# Patient Record
Sex: Male | Born: 1935
Health system: Southern US, Community
[De-identification: ages and names within clinical notes are randomized; demographics above are authoritative.]

## PROBLEM LIST (undated history)

## (undated) DIAGNOSIS — G4733 Obstructive sleep apnea (adult) (pediatric): Secondary | ICD-10-CM

## (undated) DIAGNOSIS — K8050 Calculus of bile duct without cholangitis or cholecystitis without obstruction: Secondary | ICD-10-CM

## (undated) DIAGNOSIS — F418 Other specified anxiety disorders: Secondary | ICD-10-CM

## (undated) DIAGNOSIS — F1011 Alcohol abuse, in remission: Secondary | ICD-10-CM

## (undated) DIAGNOSIS — M1990 Unspecified osteoarthritis, unspecified site: Secondary | ICD-10-CM

## (undated) DIAGNOSIS — IMO0002 Reserved for concepts with insufficient information to code with codable children: Secondary | ICD-10-CM

## (undated) DIAGNOSIS — B369 Superficial mycosis, unspecified: Secondary | ICD-10-CM

## (undated) DIAGNOSIS — R972 Elevated prostate specific antigen [PSA]: Secondary | ICD-10-CM

## (undated) DIAGNOSIS — Z974 Presence of external hearing-aid: Secondary | ICD-10-CM

## (undated) DIAGNOSIS — N529 Male erectile dysfunction, unspecified: Secondary | ICD-10-CM

## (undated) DIAGNOSIS — K635 Polyp of colon: Secondary | ICD-10-CM

## (undated) DIAGNOSIS — J449 Chronic obstructive pulmonary disease, unspecified: Secondary | ICD-10-CM

## (undated) DIAGNOSIS — Z87891 Personal history of nicotine dependence: Secondary | ICD-10-CM

## (undated) DIAGNOSIS — N486 Induration penis plastica: Secondary | ICD-10-CM

## (undated) DIAGNOSIS — H6240 Otitis externa in other diseases classified elsewhere, unspecified ear: Secondary | ICD-10-CM

## (undated) DIAGNOSIS — H9113 Presbycusis, bilateral: Secondary | ICD-10-CM

## (undated) DIAGNOSIS — I10 Essential (primary) hypertension: Secondary | ICD-10-CM

## (undated) HISTORY — DX: Other specified anxiety disorders: F41.8

## (undated) HISTORY — DX: Otitis externa in other diseases classified elsewhere, unspecified ear: H62.40

## (undated) HISTORY — DX: Alcohol abuse, in remission: F10.11

## (undated) HISTORY — DX: Unspecified osteoarthritis, unspecified site: M19.90

## (undated) HISTORY — DX: Essential (primary) hypertension: I10

## (undated) HISTORY — DX: Obstructive sleep apnea (adult) (pediatric): G47.33

## (undated) HISTORY — DX: Presbycusis, bilateral: H91.13

## (undated) HISTORY — DX: Personal history of nicotine dependence: Z87.891

## (undated) HISTORY — DX: Calculus of bile duct without cholangitis or cholecystitis without obstruction: K80.50

## (undated) HISTORY — DX: Induration penis plastica: N48.6

## (undated) HISTORY — DX: Male erectile dysfunction, unspecified: N52.9

## (undated) HISTORY — DX: Polyp of colon: K63.5

## (undated) HISTORY — DX: Superficial mycosis, unspecified: B36.9

## (undated) HISTORY — DX: Chronic obstructive pulmonary disease, unspecified: J44.9

## (undated) HISTORY — DX: Reserved for concepts with insufficient information to code with codable children: IMO0002

## (undated) HISTORY — DX: Elevated prostate specific antigen (PSA): R97.20

## (undated) HISTORY — DX: Presence of external hearing-aid: Z97.4

---
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
## 1967-08-01 HISTORY — PX: VASECTOMY: SHX75

## 1982-07-31 HISTORY — PX: OTHER SURGICAL HISTORY: SHX169

## 1987-08-01 DIAGNOSIS — F1011 Alcohol abuse, in remission: Secondary | ICD-10-CM

## 1987-08-01 HISTORY — DX: Alcohol abuse, in remission: F10.11

## 1998-07-31 DIAGNOSIS — Z87891 Personal history of nicotine dependence: Secondary | ICD-10-CM

## 1998-07-31 HISTORY — DX: Personal history of nicotine dependence: Z87.891

## 2002-12-15 ENCOUNTER — Ambulatory Visit (HOSPITAL_COMMUNITY): Admission: RE | Admit: 2002-12-15 | Discharge: 2002-12-15 | Payer: Self-pay | Admitting: Gastroenterology

## 2002-12-15 ENCOUNTER — Encounter (INDEPENDENT_AMBULATORY_CARE_PROVIDER_SITE_OTHER): Payer: Self-pay

## 2004-02-17 ENCOUNTER — Ambulatory Visit (HOSPITAL_COMMUNITY): Admission: RE | Admit: 2004-02-17 | Discharge: 2004-02-17 | Payer: Self-pay | Admitting: Orthopedic Surgery

## 2007-08-01 DIAGNOSIS — K8050 Calculus of bile duct without cholangitis or cholecystitis without obstruction: Secondary | ICD-10-CM

## 2007-08-01 HISTORY — DX: Calculus of bile duct without cholangitis or cholecystitis without obstruction: K80.50

## 2007-08-01 HISTORY — PX: CHOLECYSTECTOMY: SHX55

## 2007-08-01 HISTORY — PX: COLONOSCOPY: SHX174

## 2007-08-01 HISTORY — PX: CARDIAC CATHETERIZATION: SHX172

## 2007-09-18 ENCOUNTER — Ambulatory Visit (HOSPITAL_COMMUNITY): Admission: RE | Admit: 2007-09-18 | Discharge: 2007-09-18 | Payer: Self-pay | Admitting: Interventional Cardiology

## 2008-07-31 HISTORY — PX: OTHER SURGICAL HISTORY: SHX169

## 2008-12-22 ENCOUNTER — Ambulatory Visit (HOSPITAL_BASED_OUTPATIENT_CLINIC_OR_DEPARTMENT_OTHER): Admission: RE | Admit: 2008-12-22 | Discharge: 2008-12-22 | Payer: Self-pay | Admitting: Orthopedic Surgery

## 2009-01-17 ENCOUNTER — Emergency Department (HOSPITAL_COMMUNITY): Admission: EM | Admit: 2009-01-17 | Discharge: 2009-01-17 | Payer: Self-pay | Admitting: Emergency Medicine

## 2009-01-17 ENCOUNTER — Ambulatory Visit: Payer: Self-pay | Admitting: Surgery

## 2009-01-17 ENCOUNTER — Inpatient Hospital Stay (HOSPITAL_COMMUNITY): Admission: EM | Admit: 2009-01-17 | Discharge: 2009-02-02 | Payer: Self-pay | Admitting: Emergency Medicine

## 2009-01-18 ENCOUNTER — Ambulatory Visit: Payer: Self-pay | Admitting: Internal Medicine

## 2009-01-30 ENCOUNTER — Encounter (INDEPENDENT_AMBULATORY_CARE_PROVIDER_SITE_OTHER): Payer: Self-pay | Admitting: Internal Medicine

## 2009-03-23 ENCOUNTER — Encounter (INDEPENDENT_AMBULATORY_CARE_PROVIDER_SITE_OTHER): Payer: Self-pay | Admitting: General Surgery

## 2009-03-23 ENCOUNTER — Ambulatory Visit (HOSPITAL_COMMUNITY): Admission: RE | Admit: 2009-03-23 | Discharge: 2009-03-23 | Payer: Self-pay | Admitting: General Surgery

## 2010-08-21 ENCOUNTER — Encounter: Payer: Self-pay | Admitting: Orthopedic Surgery

## 2010-11-05 LAB — CBC
HCT: 36.6 % — ABNORMAL LOW (ref 39.0–52.0)
Hemoglobin: 12.4 g/dL — ABNORMAL LOW (ref 13.0–17.0)
RBC: 3.43 MIL/uL — ABNORMAL LOW (ref 4.22–5.81)
RDW: 13.5 % (ref 11.5–15.5)
WBC: 6.1 10*3/uL (ref 4.0–10.5)

## 2010-11-05 LAB — COMPREHENSIVE METABOLIC PANEL
ALT: 30 U/L (ref 0–53)
Alkaline Phosphatase: 47 U/L (ref 39–117)
BUN: 13 mg/dL (ref 6–23)
CO2: 28 mEq/L (ref 19–32)
Chloride: 100 mEq/L (ref 96–112)
GFR calc non Af Amer: >60 mL/min (ref >60)
Glucose, Bld: 109 mg/dL — ABNORMAL HIGH (ref 70–99)
Potassium: 4.5 mEq/L (ref 3.5–5.1)
Sodium: 138 mEq/L (ref 135–145)
Total Bilirubin: 0.7 mg/dL (ref 0.3–1.2)

## 2010-11-06 LAB — COMPREHENSIVE METABOLIC PANEL
ALT: 47 U/L (ref 0–53)
AST: 51 U/L — ABNORMAL HIGH (ref 0–37)
Albumin: 2.6 g/dL — ABNORMAL LOW (ref 3.5–5.2)
Alkaline Phosphatase: 111 U/L (ref 39–117)
BUN: 14 mg/dL (ref 6–23)
CO2: 25 mEq/L (ref 19–32)
Calcium: 8.8 mg/dL (ref 8.4–10.5)
Creatinine, Ser: 0.84 mg/dL (ref 0.4–1.5)
Glucose, Bld: 113 mg/dL — ABNORMAL HIGH (ref 70–99)
Total Bilirubin: 1.1 mg/dL (ref 0.3–1.2)

## 2010-11-06 LAB — CBC
HCT: 25.6 % — ABNORMAL LOW (ref 39.0–52.0)
HCT: 25.8 % — ABNORMAL LOW (ref 39.0–52.0)
Hemoglobin: 8.7 g/dL — ABNORMAL LOW (ref 13.0–17.0)
Hemoglobin: 8.8 g/dL — ABNORMAL LOW (ref 13.0–17.0)
Hemoglobin: 8.9 g/dL — ABNORMAL LOW (ref 13.0–17.0)
MCHC: 33.9 g/dL (ref 30.0–36.0)
MCHC: 34.6 g/dL (ref 30.0–36.0)
MCHC: 34.7 g/dL (ref 30.0–36.0)
MCV: 109.1 fL — ABNORMAL HIGH (ref 78.0–100.0)
MCV: 109.4 fL — ABNORMAL HIGH (ref 78.0–100.0)
MCV: 109.4 fL — ABNORMAL HIGH (ref 78.0–100.0)
MCV: 109.6 fL — ABNORMAL HIGH (ref 78.0–100.0)
Platelets: 198 10*3/uL (ref 150–400)
Platelets: 289 10*3/uL (ref 150–400)
Platelets: 376 10*3/uL (ref 150–400)
RBC: 2.33 MIL/uL — ABNORMAL LOW (ref 4.22–5.81)
RBC: 2.37 MIL/uL — ABNORMAL LOW (ref 4.22–5.81)
RDW: 15.2 % (ref 11.5–15.5)
RDW: 15.3 % (ref 11.5–15.5)
WBC: 5.1 10*3/uL (ref 4.0–10.5)
WBC: 5.8 10*3/uL (ref 4.0–10.5)

## 2010-11-06 LAB — BASIC METABOLIC PANEL
BUN: 10 mg/dL (ref 6–23)
BUN: 15 mg/dL (ref 6–23)
Calcium: 8.7 mg/dL (ref 8.4–10.5)
Calcium: 9.1 mg/dL (ref 8.4–10.5)
Creatinine, Ser: 0.67 mg/dL (ref 0.4–1.5)
Creatinine, Ser: 0.71 mg/dL (ref 0.4–1.5)
GFR calc Af Amer: >60 mL/min (ref >60)
GFR calc Af Amer: >60 mL/min (ref >60)

## 2010-11-06 LAB — HEMOGLOBIN AND HEMATOCRIT, BLOOD
HCT: 25.5 % — ABNORMAL LOW (ref 39.0–52.0)
Hemoglobin: 8.6 g/dL — ABNORMAL LOW (ref 13.0–17.0)

## 2010-11-06 LAB — GLUCOSE, CAPILLARY
Glucose-Capillary: 117 mg/dL — ABNORMAL HIGH (ref 70–99)
Glucose-Capillary: 119 mg/dL — ABNORMAL HIGH (ref 70–99)
Glucose-Capillary: 131 mg/dL — ABNORMAL HIGH (ref 70–99)

## 2010-11-06 LAB — IRON AND TIBC: Saturation Ratios: 12 % — ABNORMAL LOW (ref 20–55)

## 2010-11-06 LAB — FOLATE: Folate: >20 ng/mL

## 2010-11-06 LAB — FERRITIN: Ferritin: 1082 ng/mL — ABNORMAL HIGH (ref 22–322)

## 2010-11-07 LAB — BLOOD GAS, ARTERIAL
Acid-Base Excess: 10.4 mmol/L — ABNORMAL HIGH (ref 0.0–2.0)
Acid-Base Excess: 10.8 mmol/L — ABNORMAL HIGH (ref 0.0–2.0)
Acid-Base Excess: 6.7 mmol/L — ABNORMAL HIGH (ref 0.0–2.0)
Acid-Base Excess: 8 mmol/L — ABNORMAL HIGH (ref 0.0–2.0)
Acid-base deficit: 0.7 mmol/L (ref 0.0–2.0)
Acid-base deficit: 11.9 mmol/L — ABNORMAL HIGH (ref 0.0–2.0)
Acid-base deficit: 5.8 mmol/L — ABNORMAL HIGH (ref 0.0–2.0)
Acid-base deficit: 8.4 mmol/L — ABNORMAL HIGH (ref 0.0–2.0)
Bicarbonate: 16.4 mEq/L — ABNORMAL LOW (ref 20.0–24.0)
Bicarbonate: 16.4 mEq/L — ABNORMAL LOW (ref 20.0–24.0)
Bicarbonate: 20 mEq/L (ref 20.0–24.0)
Bicarbonate: 27.1 mEq/L — ABNORMAL HIGH (ref 20.0–24.0)
Bicarbonate: 31.7 mEq/L — ABNORMAL HIGH (ref 20.0–24.0)
Bicarbonate: 31.7 mEq/L — ABNORMAL HIGH (ref 20.0–24.0)
Bicarbonate: 33.3 mEq/L — ABNORMAL HIGH (ref 20.0–24.0)
Bicarbonate: 34.1 mEq/L — ABNORMAL HIGH (ref 20.0–24.0)
Delivery systems: POSITIVE
Drawn by: 103701
Drawn by: 129801
Drawn by: 145321
Drawn by: 229971
Drawn by: 229971
Drawn by: 232811
Drawn by: 270111
Expiratory PAP: 5
FIO2: 0.3 %
FIO2: 0.35 %
FIO2: 0.5 %
FIO2: 0.8 %
FIO2: 1 %
FIO2: 1 %
MECHVT: 400 mL
MECHVT: 400 mL
MECHVT: 450 mL
O2 Saturation: 82.4 %
O2 Saturation: 93.5 %
O2 Saturation: 94.5 %
O2 Saturation: 95.6 %
O2 Saturation: 97.5 %
O2 Saturation: 98.6 %
O2 Saturation: 98.9 %
PEEP: 5 cmH2O
PEEP: 5 cmH2O
PEEP: 5 cmH2O
PEEP: 5 cmH2O
PEEP: 5 cmH2O
Patient temperature: 98.4
Patient temperature: 98.6
Patient temperature: 98.6
Patient temperature: 98.6
Patient temperature: 99.1
Patient temperature: 99.2
RATE: 15 resp/min
RATE: 18 resp/min
RATE: 18 resp/min
RATE: 18 resp/min
TCO2: 14.5 mmol/L (ref 0–100)
TCO2: 15.3 mmol/L (ref 0–100)
TCO2: 15.7 mmol/L (ref 0–100)
TCO2: 18.9 mmol/L (ref 0–100)
TCO2: 25.7 mmol/L (ref 0–100)
TCO2: 29.4 mmol/L (ref 0–100)
TCO2: 31.6 mmol/L (ref 0–100)
pCO2 arterial: 32.8 mmHg — ABNORMAL LOW (ref 35.0–45.0)
pCO2 arterial: 38.6 mmHg (ref 35.0–45.0)
pCO2 arterial: 51.1 mmHg — ABNORMAL HIGH (ref 35.0–45.0)
pCO2 arterial: 52.7 mmHg — ABNORMAL HIGH (ref 35.0–45.0)
pCO2 arterial: 54.3 mmHg — ABNORMAL HIGH (ref 35.0–45.0)
pH, Arterial: 7.221 — ABNORMAL LOW (ref 7.350–7.450)
pH, Arterial: 7.328 — ABNORMAL LOW (ref 7.350–7.450)
pH, Arterial: 7.345 — ABNORMAL LOW (ref 7.350–7.450)
pH, Arterial: 7.365 (ref 7.350–7.450)
pH, Arterial: 7.396 (ref 7.350–7.450)
pH, Arterial: 7.409 (ref 7.350–7.450)
pH, Arterial: 7.412 (ref 7.350–7.450)
pO2, Arterial: 130 mmHg — ABNORMAL HIGH (ref 80.0–100.0)
pO2, Arterial: 154 mmHg — ABNORMAL HIGH (ref 80.0–100.0)
pO2, Arterial: 229 mmHg — ABNORMAL HIGH (ref 80.0–100.0)
pO2, Arterial: 59.2 mmHg — ABNORMAL LOW (ref 80.0–100.0)
pO2, Arterial: 66 mmHg — ABNORMAL LOW (ref 80.0–100.0)
pO2, Arterial: 83.8 mmHg (ref 80.0–100.0)
pO2, Arterial: 93.9 mmHg (ref 80.0–100.0)
pO2, Arterial: 99.7 mmHg (ref 80.0–100.0)

## 2010-11-07 LAB — COMPREHENSIVE METABOLIC PANEL
ALT: 110 U/L — ABNORMAL HIGH (ref 0–53)
ALT: 280 U/L — ABNORMAL HIGH (ref 0–53)
ALT: 74 U/L — ABNORMAL HIGH (ref 0–53)
AST: 1244 U/L — ABNORMAL HIGH (ref 0–37)
AST: 51 U/L — ABNORMAL HIGH (ref 0–37)
AST: 534 U/L — ABNORMAL HIGH (ref 0–37)
AST: 589 U/L — ABNORMAL HIGH (ref 0–37)
AST: 63 U/L — ABNORMAL HIGH (ref 0–37)
AST: 82 U/L — ABNORMAL HIGH (ref 0–37)
AST: 959 U/L — ABNORMAL HIGH (ref 0–37)
Albumin: 1.9 g/dL — ABNORMAL LOW (ref 3.5–5.2)
Albumin: 2.2 g/dL — ABNORMAL LOW (ref 3.5–5.2)
Albumin: 2.3 g/dL — ABNORMAL LOW (ref 3.5–5.2)
Albumin: 2.4 g/dL — ABNORMAL LOW (ref 3.5–5.2)
Albumin: 3.5 g/dL (ref 3.5–5.2)
Alkaline Phosphatase: 148 U/L — ABNORMAL HIGH (ref 39–117)
Alkaline Phosphatase: 200 U/L — ABNORMAL HIGH (ref 39–117)
Alkaline Phosphatase: 96 U/L (ref 39–117)
BUN: 15 mg/dL (ref 6–23)
BUN: 20 mg/dL (ref 6–23)
BUN: 24 mg/dL — ABNORMAL HIGH (ref 6–23)
BUN: 24 mg/dL — ABNORMAL HIGH (ref 6–23)
BUN: 28 mg/dL — ABNORMAL HIGH (ref 6–23)
BUN: 29 mg/dL — ABNORMAL HIGH (ref 6–23)
BUN: 30 mg/dL — ABNORMAL HIGH (ref 6–23)
CO2: 22 mEq/L (ref 19–32)
CO2: 27 mEq/L (ref 19–32)
CO2: 29 mEq/L (ref 19–32)
CO2: 29 mEq/L (ref 19–32)
CO2: 35 mEq/L — ABNORMAL HIGH (ref 19–32)
Calcium: 10.2 mg/dL (ref 8.4–10.5)
Calcium: 10.3 mg/dL (ref 8.4–10.5)
Calcium: 7.8 mg/dL — ABNORMAL LOW (ref 8.4–10.5)
Calcium: 8.3 mg/dL — ABNORMAL LOW (ref 8.4–10.5)
Calcium: 8.4 mg/dL (ref 8.4–10.5)
Chloride: 102 mEq/L (ref 96–112)
Chloride: 108 mEq/L (ref 96–112)
Chloride: 110 mEq/L (ref 96–112)
Chloride: 112 mEq/L (ref 96–112)
Chloride: 113 mEq/L — ABNORMAL HIGH (ref 96–112)
Creatinine, Ser: 0.73 mg/dL (ref 0.4–1.5)
Creatinine, Ser: 0.94 mg/dL (ref 0.4–1.5)
Creatinine, Ser: 1.18 mg/dL (ref 0.4–1.5)
Creatinine, Ser: 1.64 mg/dL — ABNORMAL HIGH (ref 0.4–1.5)
Creatinine, Ser: 1.9 mg/dL — ABNORMAL HIGH (ref 0.4–1.5)
GFR calc Af Amer: 42 mL/min — ABNORMAL LOW (ref >60)
GFR calc Af Amer: 48 mL/min — ABNORMAL LOW (ref >60)
GFR calc Af Amer: 50 mL/min — ABNORMAL LOW (ref >60)
GFR calc Af Amer: >60 mL/min (ref >60)
GFR calc Af Amer: >60 mL/min (ref >60)
GFR calc Af Amer: >60 mL/min (ref >60)
GFR calc Af Amer: >60 mL/min (ref >60)
GFR calc non Af Amer: 35 mL/min — ABNORMAL LOW (ref >60)
GFR calc non Af Amer: 41 mL/min — ABNORMAL LOW (ref >60)
GFR calc non Af Amer: 55 mL/min — ABNORMAL LOW (ref >60)
GFR calc non Af Amer: >60 mL/min (ref >60)
GFR calc non Af Amer: >60 mL/min (ref >60)
GFR calc non Af Amer: >60 mL/min (ref >60)
Glucose, Bld: 174 mg/dL — ABNORMAL HIGH (ref 70–99)
Glucose, Bld: 94 mg/dL (ref 70–99)
Potassium: 3.1 mEq/L — ABNORMAL LOW (ref 3.5–5.1)
Potassium: 3.4 mEq/L — ABNORMAL LOW (ref 3.5–5.1)
Potassium: 3.6 mEq/L (ref 3.5–5.1)
Potassium: 3.6 mEq/L (ref 3.5–5.1)
Potassium: 4.4 mEq/L (ref 3.5–5.1)
Sodium: 143 mEq/L (ref 135–145)
Sodium: 146 mEq/L — ABNORMAL HIGH (ref 135–145)
Sodium: 146 mEq/L — ABNORMAL HIGH (ref 135–145)
Sodium: 150 mEq/L — ABNORMAL HIGH (ref 135–145)
Total Bilirubin: 1.1 mg/dL (ref 0.3–1.2)
Total Bilirubin: 1.4 mg/dL — ABNORMAL HIGH (ref 0.3–1.2)
Total Bilirubin: 5.2 mg/dL — ABNORMAL HIGH (ref 0.3–1.2)
Total Bilirubin: 5.2 mg/dL — ABNORMAL HIGH (ref 0.3–1.2)
Total Bilirubin: 5.3 mg/dL — ABNORMAL HIGH (ref 0.3–1.2)
Total Protein: 4.9 g/dL — ABNORMAL LOW (ref 6.0–8.3)
Total Protein: 4.9 g/dL — ABNORMAL LOW (ref 6.0–8.3)
Total Protein: 5 g/dL — ABNORMAL LOW (ref 6.0–8.3)

## 2010-11-07 LAB — GLUCOSE, CAPILLARY
Glucose-Capillary: 102 mg/dL — ABNORMAL HIGH (ref 70–99)
Glucose-Capillary: 107 mg/dL — ABNORMAL HIGH (ref 70–99)
Glucose-Capillary: 110 mg/dL — ABNORMAL HIGH (ref 70–99)
Glucose-Capillary: 111 mg/dL — ABNORMAL HIGH (ref 70–99)
Glucose-Capillary: 112 mg/dL — ABNORMAL HIGH (ref 70–99)
Glucose-Capillary: 115 mg/dL — ABNORMAL HIGH (ref 70–99)
Glucose-Capillary: 117 mg/dL — ABNORMAL HIGH (ref 70–99)
Glucose-Capillary: 117 mg/dL — ABNORMAL HIGH (ref 70–99)
Glucose-Capillary: 118 mg/dL — ABNORMAL HIGH (ref 70–99)
Glucose-Capillary: 119 mg/dL — ABNORMAL HIGH (ref 70–99)
Glucose-Capillary: 127 mg/dL — ABNORMAL HIGH (ref 70–99)
Glucose-Capillary: 130 mg/dL — ABNORMAL HIGH (ref 70–99)
Glucose-Capillary: 131 mg/dL — ABNORMAL HIGH (ref 70–99)
Glucose-Capillary: 132 mg/dL — ABNORMAL HIGH (ref 70–99)
Glucose-Capillary: 133 mg/dL — ABNORMAL HIGH (ref 70–99)
Glucose-Capillary: 134 mg/dL — ABNORMAL HIGH (ref 70–99)
Glucose-Capillary: 135 mg/dL — ABNORMAL HIGH (ref 70–99)
Glucose-Capillary: 135 mg/dL — ABNORMAL HIGH (ref 70–99)
Glucose-Capillary: 136 mg/dL — ABNORMAL HIGH (ref 70–99)
Glucose-Capillary: 139 mg/dL — ABNORMAL HIGH (ref 70–99)
Glucose-Capillary: 140 mg/dL — ABNORMAL HIGH (ref 70–99)
Glucose-Capillary: 143 mg/dL — ABNORMAL HIGH (ref 70–99)
Glucose-Capillary: 144 mg/dL — ABNORMAL HIGH (ref 70–99)
Glucose-Capillary: 151 mg/dL — ABNORMAL HIGH (ref 70–99)
Glucose-Capillary: 151 mg/dL — ABNORMAL HIGH (ref 70–99)
Glucose-Capillary: 172 mg/dL — ABNORMAL HIGH (ref 70–99)
Glucose-Capillary: 91 mg/dL (ref 70–99)
Glucose-Capillary: 92 mg/dL (ref 70–99)
Glucose-Capillary: 92 mg/dL (ref 70–99)
Glucose-Capillary: 94 mg/dL (ref 70–99)

## 2010-11-07 LAB — CBC
HCT: 24.8 % — ABNORMAL LOW (ref 39.0–52.0)
HCT: 25.8 % — ABNORMAL LOW (ref 39.0–52.0)
HCT: 25.8 % — ABNORMAL LOW (ref 39.0–52.0)
HCT: 31.8 % — ABNORMAL LOW (ref 39.0–52.0)
HCT: 32.4 % — ABNORMAL LOW (ref 39.0–52.0)
HCT: 32.6 % — ABNORMAL LOW (ref 39.0–52.0)
HCT: 33.9 % — ABNORMAL LOW (ref 39.0–52.0)
HCT: 38.5 % — ABNORMAL LOW (ref 39.0–52.0)
HCT: 39.1 % (ref 39.0–52.0)
Hemoglobin: 10.9 g/dL — ABNORMAL LOW (ref 13.0–17.0)
Hemoglobin: 11.1 g/dL — ABNORMAL LOW (ref 13.0–17.0)
Hemoglobin: 11.3 g/dL — ABNORMAL LOW (ref 13.0–17.0)
Hemoglobin: 8.6 g/dL — ABNORMAL LOW (ref 13.0–17.0)
Hemoglobin: 9.1 g/dL — ABNORMAL LOW (ref 13.0–17.0)
MCHC: 33.9 g/dL (ref 30.0–36.0)
MCHC: 34 g/dL (ref 30.0–36.0)
MCHC: 34 g/dL (ref 30.0–36.0)
MCHC: 34.7 g/dL (ref 30.0–36.0)
MCHC: 34.8 g/dL (ref 30.0–36.0)
MCHC: 35 g/dL (ref 30.0–36.0)
MCV: 108.2 fL — ABNORMAL HIGH (ref 78.0–100.0)
MCV: 108.7 fL — ABNORMAL HIGH (ref 78.0–100.0)
MCV: 108.7 fL — ABNORMAL HIGH (ref 78.0–100.0)
MCV: 108.8 fL — ABNORMAL HIGH (ref 78.0–100.0)
MCV: 109 fL — ABNORMAL HIGH (ref 78.0–100.0)
MCV: 111.2 fL — ABNORMAL HIGH (ref 78.0–100.0)
Platelets: 115 10*3/uL — ABNORMAL LOW (ref 150–400)
Platelets: 146 10*3/uL — ABNORMAL LOW (ref 150–400)
Platelets: 22 10*3/uL — CL (ref 150–400)
Platelets: 34 10*3/uL — CL (ref 150–400)
Platelets: 36 10*3/uL — CL (ref 150–400)
Platelets: 54 10*3/uL — ABNORMAL LOW (ref 150–400)
Platelets: 60 10*3/uL — ABNORMAL LOW (ref 150–400)
Platelets: 62 10*3/uL — ABNORMAL LOW (ref 150–400)
Platelets: 62 10*3/uL — ABNORMAL LOW (ref 150–400)
Platelets: 81 10*3/uL — ABNORMAL LOW (ref 150–400)
RBC: 2.31 MIL/uL — ABNORMAL LOW (ref 4.22–5.81)
RBC: 2.79 MIL/uL — ABNORMAL LOW (ref 4.22–5.81)
RBC: 2.88 MIL/uL — ABNORMAL LOW (ref 4.22–5.81)
RBC: 2.91 MIL/uL — ABNORMAL LOW (ref 4.22–5.81)
RBC: 2.98 MIL/uL — ABNORMAL LOW (ref 4.22–5.81)
RBC: 3.06 MIL/uL — ABNORMAL LOW (ref 4.22–5.81)
RBC: 3.6 MIL/uL — ABNORMAL LOW (ref 4.22–5.81)
RDW: 14.9 % (ref 11.5–15.5)
RDW: 15.1 % (ref 11.5–15.5)
RDW: 15.1 % (ref 11.5–15.5)
RDW: 15.2 % (ref 11.5–15.5)
RDW: 15.2 % (ref 11.5–15.5)
RDW: 15.3 % (ref 11.5–15.5)
RDW: 15.6 % — ABNORMAL HIGH (ref 11.5–15.5)
WBC: 14.2 10*3/uL — ABNORMAL HIGH (ref 4.0–10.5)
WBC: 17.3 10*3/uL — ABNORMAL HIGH (ref 4.0–10.5)
WBC: 18.5 10*3/uL — ABNORMAL HIGH (ref 4.0–10.5)
WBC: 32 10*3/uL — ABNORMAL HIGH (ref 4.0–10.5)
WBC: 6.4 10*3/uL (ref 4.0–10.5)
WBC: 8 10*3/uL (ref 4.0–10.5)
WBC: 9.6 10*3/uL (ref 4.0–10.5)

## 2010-11-07 LAB — BASIC METABOLIC PANEL
BUN: 29 mg/dL — ABNORMAL HIGH (ref 6–23)
BUN: 29 mg/dL — ABNORMAL HIGH (ref 6–23)
BUN: 32 mg/dL — ABNORMAL HIGH (ref 6–23)
CO2: 21 mEq/L (ref 19–32)
CO2: 26 mEq/L (ref 19–32)
Calcium: 6.9 mg/dL — ABNORMAL LOW (ref 8.4–10.5)
Calcium: 7.6 mg/dL — ABNORMAL LOW (ref 8.4–10.5)
Calcium: 7.7 mg/dL — ABNORMAL LOW (ref 8.4–10.5)
Chloride: 108 mEq/L (ref 96–112)
Creatinine, Ser: 0.93 mg/dL (ref 0.4–1.5)
Creatinine, Ser: 1.05 mg/dL (ref 0.4–1.5)
Creatinine, Ser: 1.15 mg/dL (ref 0.4–1.5)
GFR calc Af Amer: >60 mL/min (ref >60)
GFR calc Af Amer: >60 mL/min (ref >60)
GFR calc non Af Amer: 51 mL/min — ABNORMAL LOW (ref >60)
GFR calc non Af Amer: >60 mL/min (ref >60)
GFR calc non Af Amer: >60 mL/min (ref >60)
GFR calc non Af Amer: >60 mL/min (ref >60)
Glucose, Bld: 112 mg/dL — ABNORMAL HIGH (ref 70–99)
Glucose, Bld: 158 mg/dL — ABNORMAL HIGH (ref 70–99)
Potassium: 3 mEq/L — ABNORMAL LOW (ref 3.5–5.1)
Potassium: 3.8 mEq/L (ref 3.5–5.1)
Sodium: 139 mEq/L (ref 135–145)
Sodium: 143 mEq/L (ref 135–145)

## 2010-11-07 LAB — DIFFERENTIAL
Band Neutrophils: 0 % (ref 0–10)
Basophils Absolute: 0 10*3/uL (ref 0.0–0.1)
Basophils Absolute: 0 10*3/uL (ref 0.0–0.1)
Basophils Relative: 0 % (ref 0–1)
Eosinophils Absolute: 0 10*3/uL (ref 0.0–0.7)
Eosinophils Absolute: 0 10*3/uL (ref 0.0–0.7)
Eosinophils Relative: 0 % (ref 0–5)
Eosinophils Relative: 0 % (ref 0–5)
Eosinophils Relative: 1 % (ref 0–5)
Lymphocytes Relative: 0 % — ABNORMAL LOW (ref 12–46)
Lymphocytes Relative: 1 % — ABNORMAL LOW (ref 12–46)
Lymphocytes Relative: 3 % — ABNORMAL LOW (ref 12–46)
Lymphs Abs: 0.1 10*3/uL — ABNORMAL LOW (ref 0.7–4.0)
Lymphs Abs: 0.8 10*3/uL (ref 0.7–4.0)
Monocytes Absolute: 0 10*3/uL — ABNORMAL LOW (ref 0.1–1.0)
Monocytes Absolute: 0.6 10*3/uL (ref 0.1–1.0)
Monocytes Relative: 0 % — ABNORMAL LOW (ref 3–12)
Monocytes Relative: 1 % — ABNORMAL LOW (ref 3–12)
Neutro Abs: 3.9 10*3/uL (ref 1.7–7.7)
Neutro Abs: 7.7 10*3/uL (ref 1.7–7.7)
Neutrophils Relative %: 0 % — ABNORMAL LOW (ref 43–77)
Neutrophils Relative %: 95 % — ABNORMAL HIGH (ref 43–77)
WBC Morphology: INCREASED

## 2010-11-07 LAB — DIC (DISSEMINATED INTRAVASCULAR COAGULATION)PANEL
D-Dimer, Quant: >20 ug/mL-FEU — ABNORMAL HIGH (ref 0.00–0.48)
Fibrinogen: 477 mg/dL — ABNORMAL HIGH (ref 204–475)
Fibrinogen: 651 mg/dL — ABNORMAL HIGH (ref 204–475)
INR: 1.2 (ref 0.00–1.49)
Platelets: 16 10*3/uL — CL (ref 150–400)
Platelets: 41 10*3/uL — CL (ref 150–400)
Prothrombin Time: 15.4 seconds — ABNORMAL HIGH (ref 11.6–15.2)
Smear Review: NONE SEEN

## 2010-11-07 LAB — CARBOXYHEMOGLOBIN
Carboxyhemoglobin: 0.4 % — ABNORMAL LOW (ref 0.5–1.5)
Carboxyhemoglobin: 0.6 % (ref 0.5–1.5)
Carboxyhemoglobin: 0.6 % (ref 0.5–1.5)
Carboxyhemoglobin: 0.7 % (ref 0.5–1.5)
Carboxyhemoglobin: 0.8 % (ref 0.5–1.5)
Carboxyhemoglobin: 0.9 % (ref 0.5–1.5)
Methemoglobin: 1.5 % (ref 0.0–1.5)
Methemoglobin: 1.8 % — ABNORMAL HIGH (ref 0.0–1.5)
O2 Saturation: 63.5 %
O2 Saturation: 68.2 %
O2 Saturation: 69 %
O2 Saturation: 69.8 %
O2 Saturation: 70.2 %
O2 Saturation: 71.1 %
O2 Saturation: 72.5 %
Total hemoglobin: 10.4 g/dL — ABNORMAL LOW (ref 13.5–18.0)
Total hemoglobin: 10.7 g/dL — ABNORMAL LOW (ref 13.5–18.0)
Total hemoglobin: 10.9 g/dL — ABNORMAL LOW (ref 13.5–18.0)

## 2010-11-07 LAB — URINALYSIS, ROUTINE W REFLEX MICROSCOPIC
Glucose, UA: NEGATIVE mg/dL
Ketones, ur: NEGATIVE mg/dL
Leukocytes, UA: NEGATIVE
Nitrite: NEGATIVE
Nitrite: NEGATIVE
Protein, ur: 100 mg/dL — AB
Specific Gravity, Urine: 1.035 — ABNORMAL HIGH (ref 1.005–1.030)
Urobilinogen, UA: 1 mg/dL (ref 0.0–1.0)
pH: 6.5 (ref 5.0–8.0)

## 2010-11-07 LAB — CULTURE, BLOOD (ROUTINE X 2)
Culture: NO GROWTH
Culture: NO GROWTH

## 2010-11-07 LAB — APTT: aPTT: 40 seconds — ABNORMAL HIGH (ref 24–37)

## 2010-11-07 LAB — HEPATIC FUNCTION PANEL
ALT: 340 U/L — ABNORMAL HIGH (ref 0–53)
AST: 351 U/L — ABNORMAL HIGH (ref 0–37)
Albumin: 2.2 g/dL — ABNORMAL LOW (ref 3.5–5.2)
Alkaline Phosphatase: 82 U/L (ref 39–117)
Total Bilirubin: 5.2 mg/dL — ABNORMAL HIGH (ref 0.3–1.2)

## 2010-11-07 LAB — CK TOTAL AND CKMB (NOT AT ARMC)
CK, MB: 2.8 ng/mL (ref 0.3–4.0)
Relative Index: INVALID (ref 0.0–2.5)
Total CK: 53 U/L (ref 7–232)

## 2010-11-07 LAB — POCT CARDIAC MARKERS
CKMB, poc: <1 ng/mL — ABNORMAL LOW (ref 1.0–8.0)
Myoglobin, poc: 101 ng/mL (ref 12–200)

## 2010-11-07 LAB — CULTURE, BAL-QUANTITATIVE W GRAM STAIN: Culture: NO GROWTH

## 2010-11-07 LAB — URINE CULTURE
Colony Count: NO GROWTH
Colony Count: NO GROWTH
Culture: NO GROWTH
Culture: NO GROWTH
Special Requests: NEGATIVE

## 2010-11-07 LAB — CORTISOL: Cortisol, Plasma: 32.7 ug/dL

## 2010-11-07 LAB — D-DIMER, QUANTITATIVE: D-Dimer, Quant: 12.63 ug/mL-FEU — ABNORMAL HIGH (ref 0.00–0.48)

## 2010-11-07 LAB — PREPARE PLATELETS

## 2010-11-07 LAB — BRAIN NATRIURETIC PEPTIDE
Pro B Natriuretic peptide (BNP): 746 pg/mL — ABNORMAL HIGH (ref 0.0–100.0)
Pro B Natriuretic peptide (BNP): 81.1 pg/mL (ref 0.0–100.0)
Pro B Natriuretic peptide (BNP): 814 pg/mL — ABNORMAL HIGH (ref 0.0–100.0)

## 2010-11-07 LAB — PROTIME-INR: INR: 1.7 — ABNORMAL HIGH (ref 0.00–1.49)

## 2010-11-07 LAB — LIPASE, BLOOD: Lipase: 32 U/L (ref 11–59)

## 2010-11-07 LAB — URINE MICROSCOPIC-ADD ON

## 2010-11-07 LAB — TROPONIN I: Troponin I: 0.27 ng/mL — ABNORMAL HIGH (ref 0.00–0.06)

## 2010-11-07 LAB — LACTIC ACID, PLASMA: Lactic Acid, Venous: 5.8 mmol/L — ABNORMAL HIGH (ref 0.5–2.2)

## 2010-12-13 NOTE — Consult Note (Signed)
[name], [name]               ACCOUNT NO.:  [id number]   MEDICAL RECORD NO.:  [id number]          PATIENT TYPE:  INP   LOCATION:  1227                         FACILITY:  [facility]   PHYSICIAN:  Bernette Redbird, M.D.   DATE OF BIRTH:  Jul 04, 1933   DATE OF CONSULTATION:  01/17/2009  DATE OF DISCHARGE:                                 CONSULTATION   GASTROENTEROLOGY CONSULTATION:  Dr. 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   [name] has enjoyed generally good health in the past, although he  does carry a diagnosis of COPD.  He has a 48-hour history of recurring  shaking chills and epigastric pain, in an undulating, on and off  fashion.  He presented to the emergency room in the early hours of this  morning but elected to leave so as to make a brunch date with his  daughter for Father's Day.  He came back when he developed further  shaking chills while trying to pick up his medication at the pharmacy,  and the pharmacy staff actually called 9-1-1 and had him transported by  ambulance to the emergency room.   Diagnostic evaluation has included an abdominal ultrasound that shows  gallbladder sludge and stones with a gallbladder wall that is normal in  thickness, and there was no pericholecystic fluid.  The common bile duct  was at the upper limit of normal in diameter at 7.2 mm.  His pain  responded somewhat to Toradol in the emergency room, but it has come  back and is now about 5 on a scale of 10.  Of greater concern, his vital  signs are unstable with a systolic blood pressure of 95 and a heart rate  of 122.  He had a temperature of 103 earlier, but it has come down  slightly after a dose of antibiotics.  Labs are very concerning, with  his white count having dropped from 4100 (96% polys) this morning to 700  this evening, with no differential able to be done due to the low number  of white  cells seen.  In the meantime, his platelet count has dropped,  and his creatinine has gone up.  Liver chemistries are elevated, but  lipase is normal.   PAST MEDICAL HISTORY:  NO KNOWN DRUG ALLERGIES.   OUTPATIENT MEDICATIONS:  1. Spiriva.  2. Klonopin.   OPERATIONS:  I believe there has been no prior abdominal surgery.   CHRONIC MEDICAL ILLNESSES:  COPD.  No known coronary disease or  diabetes.   HABITS:  Nonsmoker, moderate ethanol (one glass of wine daily).   FAMILY HISTORY:  Not obtained.   SOCIAL HISTORY:  Lives with his wife, but [place] and is not currently not reachable.  He does not  recall her cell phone number.  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   REVIEW OF SYSTEMS:  Pertinent per HPI.  PHYSICAL EXAMINATION:  VITAL SIGNS:  Systolic blood pressure 95, heart  rate 123.  GENERAL:  The patient is a healthy-appearing, somewhat stocky Caucasian  male.  He is slightly tremulous, presumably due to shivers, but he is  not having frank rigors.  His skin is warm and dry.  He is not really in  any acute distress, and his mentation is completely normal.  CHEST:  Clear.  HEART:  Normal, other than a rapid rate.  ABDOMEN:  Quiet.  There is no organomegaly nor, interestingly, any  guarding or significant tenderness, other than just a little bit of  subjective left upper quadrant tenderness.  No rigidity or rebound.   LABORATORY DATA:  Current white count 700 with no discernible white  cells seen to make a differential.  Hemoglobin 13.5, MCV 108, platelets  115,000.  Earlier today, his white count was 4100 with 96% polys, his  platelets were 146,000, but he had an elevated D-dimer of 12.63 (near  normal up to 0.48).  Current labs are pertinent for a BUN of 24 as  compared to 15 earlier today, creatinine 1.64 as compared to 1.2 earlier  today,  bilirubin 5.3 as compared to 3.1 earlier today, alkaline  phosphatase 195, AST 959 as compared to 1244 earlier today, ALT 560 as  compared to 469 earlier today.  Lactate level is substantially elevated  at 5.8 but amylase and lipase are normal and troponins are negative.  Blood cultures are pending.  The patient has received a dose of Zosyn in  the [facility] emergency room and is soon to receive vancomycin.   IMPRESSION:  The picture is very compatible with septic cholangitis.  I  am extremely concerned by what appears to be a septic picture,  characterized by leukopenia and deterioration of vital signs,  hypotension, azotemia, lactic acidosis (bicarbonate is still normal at  20, but it was 27 earlier today).  In my opinion, this patient is on the  verge of becoming floridly septic, and the most likely site of origin,  based on the shaking chills and the elevated liver chemistries, is  cholangitis.  Thus, I think the quickest, simplest, and most likely to  be effective intervention would be ERCP with stent placement.  I have  reviewed the nature, purpose and risks of the procedure with the  patient, and he is agreeable.  It will be done by my on-call partner,  Dr. Evette Cristal, this evening.  Depending on his clinical evolution, surgical  consultation will probably ultimately be needed, but hopefully his  septic parameters will turn around with broadening of antibiotic  coverage and achievement of adequate drainage in the biliary tree.   We appreciate the opportunity to have seen this patient in consultation  with you.           ______________________________  Bernette Redbird, M.D.     RB/MEDQ  D:  01/17/2009  T:  01/18/2009  Job:  [id number]   cc:   Caryn Bee L. Little, M.D.  Fax: [phone]

## 2010-12-13 NOTE — Op Note (Signed)
NAME:  [name], [name]               ACCOUNT NO.:  [id number]   MEDICAL RECORD NO.:  [id number]          PATIENT TYPE:  INP   LOCATION:  1227                         FACILITY:  [facility]   PHYSICIAN:  Graylin Shiver, M.D.   DATE OF BIRTH:  26-Mar-1933   DATE OF PROCEDURE:  01/17/2009  DATE OF DISCHARGE:                               OPERATIVE REPORT   PROCEDURE PERFORMED:  Endoscopic retrograde cholangiogram with  sphincterotomy and common bile duct stone removal.   INDICATIONS FOR PROCEDURE:  The patient is a 77 year old male who  presented to the emergency room with epigastric abdominal pain, elevated  liver enzymes, fever and it was felt on clinical examination that he had  ascending cholangitis based on the presentation and findings.  I was  called in on the evening of this procedure to do an emergent ERCP  because of suspected ascending cholangitis.  He had gallstones on  ultrasound.   Informed consent was obtained after explanation of the risks of  bleeding, infection, perforation and pancreatitis.   MEDICATIONS GIVEN:  Fentanyl 75 mcg IV, Versed 6 mg IV, Benadryl 25 mg  IV.   PROCEDURE:  With the patient lying on the abdomen on the fluoroscopy  table, the lateral viewing duodenum scope was inserted into the  oropharynx and passed into the esophagus.  It was advanced down the  esophagus then into the stomach and then into the duodenum.  In the  stomach there was a small 6 mm antral ulcer noted.  The duodenum looked  normal.  The papilla of Vater was found.  Selective cannulation was  achieved of the common bile duct and contrast was injected into the  common bile duct.  There was a small filling defect noted in the common  bile duct which I felt was consistent with a stone.  It was not  obstructing, however.  After securing the guidewire and proper  localization of the sphincterotome, a sphincterotomy was performed.  The  sphincterotome was then removed leaving the guidewire in  place and a  biliary balloon was advanced over the guidewire up into the proximal  common duct.  The balloon was inflated initially to 12 mm and the duct  was swept with removal of the small stone that was seen.  There was also  some gravel that was seen to be removed from the common bile duct with  sweeping.  I then inflated the balloon to 15 mm and was able to sweep  the duct with the 15 mm balloon and it easily came out of the  sphincterotomy site.  Final cholangiogram looked clear with no evidence  of further stones in the bile duct.  There was no pus seen coming from  the biliary tree.  He tolerated the procedure well without  complications.  No pancreatic cannulations or injections were done.   IMPRESSION:  1. A 6 mm antral ulcer.  2. Common bile duct stone removed with final cholangiogram being      clear.           ______________________________  Graylin Shiver, M.D.  SFG/MEDQ  D:  01/20/2009  T:  01/21/2009  Job:  [id number]

## 2010-12-13 NOTE — Consult Note (Signed)
NAMEANGELINO, RUMERY               ACCOUNT NO.:  [id number]   MEDICAL RECORD NO.:  [id number]          PATIENT TYPE:  INP   LOCATION:  1342                         FACILITY:  [facility]   PHYSICIAN:  Antonietta Breach, M.D.  DATE OF BIRTH:  16-Apr-1933   DATE OF CONSULTATION:  01/27/2009  DATE OF DISCHARGE:                                 CONSULTATION   REASON FOR CONSULTATION:  1. Psychosis.  2. Agitation.   REQUESTING PHYSICIAN:  [facility].   HISTORY OF PRESENT ILLNESS:  Mr. Coakley is a 77 year old male admitted  to the [facility] on January 17, 2009, due to epigastric pain.   He developed approximately 7 days of greatly impaired memory, as well as  agitation, hallucinations and delusions.  He has also had waxing and  waning clouding of consciousness.   The onset of his mental status changes is correlated with his general  medical exacerbation and being in the hospital.  Please see the past  medical history.   PAST PSYCHIATRIC HISTORY:  He has no prior history of delirium.   FAMILY PSYCHIATRIC HISTORY:  None known.   SOCIAL HISTORY:  By the past medical record, Mr. Minshall drinks 4-5  drinks a week.  However, his actual intake is unclear.  At the time of  the undersigned's visit, he is a poor historian.  Occupation:  Retired.  He does not use any illegal drugs.   PAST MEDICAL HISTORY:  1. Cholangitis.  2. Delirium.   ALLERGIES:  Amoxicillin.   MEDICATIONS:  The MAR is reviewed.  1. Klonopin currently at 1 mg q.8 h.  2. Haldol 3 mg q.6 h.  3. Haldol 2.5 mg q.3 h. p.r.n.  4. He has required Ativan 0.5 mg once today already.   LABORATORY DATA:  WBC 6.4, hemoglobin 9.1, platelet count 148,000.  Sodium 139, BUN 7, creatinine 0.76, SGOT 63, SGPT 74.   Mr. Maclin QTc on EKG today rose to 490 and his Haldol was held.   REVIEW OF SYSTEMS:  Mr. Knueppel is not able to provide this.  It is  gleaned from the staff and the medical record.   Constitutional, head,  eyes, ears, nose, and throat, mouth, neurologic,  psychiatric, cardiovascular, respiratory, gastrointestinal,  genitourinary, skin, musculoskeletal, hematologic, lymphatic, endocrine,  metabolic all unremarkable.   PHYSICAL EXAMINATION:  VITAL SIGNS:  Temperature 99.0, pulse 113,  respiratory rate 28, blood pressure 122/61.  GENERAL APPEARANCE:  Mr. Bradsher is an elderly male partially reclined in  a supine position in his hospital bed with no abnormal involuntary  movements.   MENTAL STATUS EXAM:  Mr. Vue has mild clouding of consciousness.  His  eye contact is intermittent.  His attention span is moderately  decreased.  His affect is mildly anxious, mood is mildly anxious.  Concentration is moderately decreased.  He believes that the date is  June 31.  He states that the year is 2001.  He does name the hospital  correctly.  He is oriented to person.  He has impaired memory.  His fund  of knowledge and intelligence are below that of  his estimated premorbid  baseline.   His speech involves some partial disorganization.  There is slight  dysarthria.  Thought process:  Some partial disorganization.  Thought  content:  No current hallucinations or delusions.  His insight is poor,  judgment is impaired.   ASSESSMENT:  AXIS I:  293.00 Delirium not otherwise specified.  There  appear to be multiple factors including his cholangitis along with  anemia and the age of his brain, as well as hospitalization in an  intensive care unit.  AXIS II:  None.  AXIS III:  See past medical history and laboratory data.  He has acute  cholangitis, anemia.  AXIS IV:  General medical.  AXIS V:  25.   Mr. Laforte delirium has improved.  On Haldol 3 mg q.6 h., his QTc has  been close to 500 msec.   Therefore, would reduce his standing Haldol down to 2 mg q.6 h. given  his improvement and would continue to taper off the Haldol by cutting  the dosage in half each day as long as he continues to  improve.   Would monitor for stiffness or other extrapyramidal side effects and  would continue to check the QTc.   Would discontinue the Haldol altogether if the QTc rises above 500 msec.   Regarding other possible etiologies for his delirium   Dictation Ended At This [facility], M.D.  Electronically Signed     JW/MEDQ  D:  02/01/2009  T:  02/01/2009  Job:  [id number]

## 2010-12-13 NOTE — Op Note (Signed)
[name], [name]               ACCOUNT NO.:  [id number]   MEDICAL RECORD NO.:  [id number]          PATIENT TYPE:  AMB   LOCATION:  DSC                          FACILITY:  [facility]   PHYSICIAN:  Katy Fitch. Sypher, M.D. DATE OF BIRTH:  Jul 13, 1933   DATE OF PROCEDURE:  12/22/2008  DATE OF DISCHARGE:  12/22/2008                               OPERATIVE REPORT   PREOPERATIVE DIAGNOSIS:  A 3 x 2 cm distally-based flap laceration, left  thumb pulp and small laceration overlying left index finger  metacarpophalangeal joint due to injury sustained with a fractured  ceramic cup.   POSTOPERATIVE DIAGNOSIS:  A 3 x 2 cm distally-based flap laceration,  left thumb pulp and small laceration overlying left index finger  metacarpophalangeal joint due to injury sustained with a fractured  ceramic cup.   OPERATION:  1. Irrigation and debridement of skin and subcutaneous tissue, left      thumb.  2. Repair of distally-based flap laceration, left thumb pulp measuring      3 x 2 cm and repair of minor laceration overlying left index      metacarpophalangeal joint in palm.   OPERATIONS:  Katy Fitch. Sypher, MD   ASSISTANT:  Marveen Reeks Dasnoit, PA-C   ANESTHESIA:  Lidocaine 2% field block of left thumb and the left index  finger and palm at metacarpophalangeal joint level.   ANESTHETIST:  Katy Fitch. Sypher, MD   This procedure was performed in the minor operating room setting at the  [facility].   INDICATIONS:  Kevin Hair. Wolf is a 77 year old gentleman who is  referred through the courtesy of the [facility] for evaluation of a complex flap laceration of left thumb and a  palm laceration.  He had a history of accidentally breaking a ceramic  cup at home lacerating his palm and thumb.  He had profuse bleeding.  He  was seen at the [facility] where his  bleeding was challenging to control.   An urgent hand surgery consult was  requested.   We advised them to place a pressure dressing and send him directly to  the [facility] Emergency Room.   After informed consent, he is evaluated at this time and advised to  proceed with irrigation and debridement of his wounds followed by  repair, dressing, and initiation of a home-based rehab program.   PROCEDURE:  Kevin Cremer. Wolf was met in the holding area of the [employer].  He is a 77 year old right-hand-dominant retired  gentleman.  He was awake and alert.  He describes breaking a ceramic  vessel at home sustaining a deep laceration to his left thumb and palm  region.  He tried to control bleeding at home and had quite a bit of  difficulty doing so.  He placed a dressing on his hand covered by a  plastic bag and proceeded to the [facility].  There he was  evaluated by their medical staff and found to have difficult to control  bleeding in the thumb.  An urgent  hand surgery consult was requested.   We advised immediate transfer to the [facility].   He was promptly met in the holding area.  His past history is reviewed.  He reported that he had had a beverage for breakfast.  He had not had a  large   Dictation ended at this point.      Katy Fitch Sypher, M.D.  Electronically Signed     RVS/MEDQ  D:  12/23/2008  T:  12/24/2008  Job:  [id number]

## 2010-12-13 NOTE — Cardiovascular Report (Signed)
NAMESOLLIE, [name] NO.:  [id number]   MEDICAL RECORD NO.:  [id number]          PATIENT TYPE:  OIB   LOCATION:  2852                         FACILITY:  [facility]   PHYSICIAN:  Corky Crafts, MDDATE OF BIRTH:  20-Jan-1933   DATE OF PROCEDURE:  09/18/2007  DATE OF DISCHARGE:                            CARDIAC CATHETERIZATION   REFERRING PHYSICIAN:  Caryn Bee L. Little, M.D.   PROCEDURES PERFORMED:  1. Coronary angiogram.  2. Left ventriculogram.  3. Left heart catheterization.  4. Abdominal aortogram.   OPERATOR:  Corky Crafts, MD   INDICATIONS:  Abnormal stress test and shortness of breath.   PROCEDURE:  The risks and benefits of cardiac catheterization were  explained to the patient and informed consent was obtained.  The patient  was brought to the catheterization lab.  He was prepped and draped in  the usual sterile fashion.  His right groin was infiltrated with 1%  lidocaine.  A 6-French arterial sheath was placed into the right femoral  artery using the modified Seldinger technique.   Left coronary artery angiography was performed using a JL-4.0 catheter.  The catheter was advanced to the vessel ostium under fluoroscopic  guidance.  Digital angiography was performed in multiple projections  using hand injection of contrast.   The  right coronary artery angiography was performed using a JR-4.0  catheter.  The catheter was advanced to the vessel ostium under  fluoroscopic guidance.  Digital angiography was performed in multiple  projections using hand injection of contrast.   A pigtail catheter was then advanced to the ascending aorta and across  the aortic valve.  The catheter was used to perform a power injection in  the 30-degree RAO position.  The catheter was then pulled back under  continuous hemodynamic pressure monitoring.  The catheter was then  pulled back to the abdominal aorta.  Power injection of contrast was  performed in the AP  projection.  The sheath was removed using manual  compression.   FINDINGS:  1. The left main was widely patent.  2. The left circumflex was a large vessel with mild luminal      irregularities.  There was a medium-sized first obtuse marginal      which was widely patent.  The OM-2 and OM-3 were small, but widely      patent.  3. The left anterior descending was a medium-sized vessel.  It was      large proximally; there were mild luminal irregularities,      particularly in the mid and distal vessel.  There were 2 small      diagonal vessels, both of which were widely patent.  4. The right coronary artery showed signs of catheter-induced spasm      when the 6-French JR catheter was used.  This catheter was then      switched out for a 4-French JR-4 catheter.  Then 200 mcg of      intracoronary nitroglycerin was administered.  The spasm seemed to      resolve.  The vessel became a large dominant vessel.  There  was a      medium-sized PDA going to the apex.  There was a small      posterolateral branch.  There were only mild irregularities      throughout the right coronary system.   LEFT VENTRICULOGRAM:  Showed normal left ventricular function.  There  was no significant mitral regurgitation.   HEMODYNAMIC RESULTS:  1. Left ventricle pressure 123/4 with an LVEDP of 16 mmHg.  2. Aortic pressure 124/73 with a mean aortic pressure of 96 mmHg.   ABDOMINAL AORTOGRAM:  Showed no abdominal aortic aneurysm.  There was  dual arterial supply to the right kidney.  There was single arterial  supply to the left kidney.  All branches were widely patent.   IMPRESSION:  1. No significant coronary artery disease.  2. Normal left ventricular function.  3. No aortic valve gradient.  4. No abdominal aortic aneurysm.  5. No renal artery stenosis.   RECOMMENDATIONS:  The patient should continue with aggressive preventive  therapy.  He will likely need a pulmonary evaluation for his shortness  of  breath.  He will undergo the sheath pull here in the catheterization  lab and have about 4 hours of bedrest.  Will also give post  catheterization hydration      Corky Crafts, MD  Electronically Signed     JSV/MEDQ  D:  09/18/2007  T:  09/19/2007  Job:  [id number]

## 2010-12-13 NOTE — Op Note (Signed)
[name], [name]               ACCOUNT NO.:  [id number]   MEDICAL RECORD NO.:  [id number]          PATIENT TYPE:  AMB   LOCATION:  DSC                          FACILITY:  [facility]   PHYSICIAN:  Kevin Fitch. Wolf, M.D. DATE OF BIRTH:  Nov 23, 1932   DATE OF PROCEDURE:  12/22/2008  DATE OF DISCHARGE:  12/22/2008                               OPERATIVE REPORT   PREOPERATIVE DIAGNOSES:  Complex distally based flap laceration, left  thumb pulp and small laceration left thumb palm overlying index  metacarpophalangeal joint.   POSTOPERATIVE DIAGNOSES:  Complex distally based flap laceration, left  thumb pulp and small laceration left thumb palm overlying index  metacarpophalangeal joint.   OPERATIONS:  Irrigation and debridement of flap laceration and pulp,  left thumb followed by repair of distally-based flap laceration left  thumb and repair of palm laceration.   OPERATING SURGEON:  Kevin Fitch. Sypher, MD   ASSISTANT:  Kevin Rusk PA-C   ANESTHESIA:  Lidocaine 2%, metacarpal head level block, left thumb, and  left palmar block.  This was performed in the minor operating room  setting, no sedation was provided.   INDICATIONS:  [name] is a 77 year old retired gentleman  referred through the courtesy of the physicians at [facility].   Early on the morning of Dec 22, 2008, he broke a ceramic vessel at home  sustaining a complex laceration of his left thumb pulp.   He had profuse bleeding that he was unable to control.  He wrapped his  hand in a bandage, placed a plastic bag over his hand, and was  transported to the Urgent Care Center at [facility].   There the medical staff noted difficulty controlling bleeding.  They  requested an urgent hand surgery consult.   He was advised to proceed directly to the [facility] on [facility].   There he was met immediately in the holding area where he was noted to  have a large flap laceration of the left thumb pulp with a large clot  present.  He had a minor laceration of his index metacarpophalangeal  joint.   He appeared to have intact sensory and motor function of his fingers and  thumb.  His past medical history is reviewed detail.  He is basically a  healthy 77 year old gentleman.  He is followed by Dr. Catha Wolf for  primary care.   We recommended irrigation and debridement of his wound on the thumb  followed by hemostasis and repair of a distally-based flap laceration.   Due to the nature of the flap laceration that was 3 cm in length and 2  cm in width, this is a flap that could be in some jeopardy for loss of  vascularity over time.   This will be treated as a full-thickness skin graft.   After informed consent, he is brought to the operating room at this  time.   PROCEDURE:  [name] was brought to the operating room and  placed in supine position upon the table.  After informed consent,  his  hand was prepped with Betadine and 2% lidocaine was infiltrated at the  metacarpal head level of the thumb and in the region of the laceration  over the index metacarpal head.  When anesthesia was satisfactory, the  arm was prepped with Betadine soap solution and sterilely draped.   The arm was exsanguinated, followed by inflation of an arterial  tourniquet on the proximal brachium.  Procedure commenced with orderly  debridement of clot.  The pulp was scrubbed with saline-soaked sponges  and hemostasis achieved with bipolar cautery.  There did not appear to  be any involvement of the flexor tendon.  The superficial radial and  ulnar proper digital nerves were lacerated.  There will be numbness in  the flap due to its distally-based nature.  The palm laceration was  superficial.  This was cleaned and sutured with a single suture.  The  flap was inset with trauma sutures and multiple interrupted sutures of 5-  0 nylon.   Both  wounds were dressed with Xeroflo, sterile gauze, and a Coban  dressing.   [name] is advised to elevate his hand for the next 48 hours.  He  will return to our office on Monday, Dec 28, 2008, or Tuesday, December 29, 2008, for dressing change and assessment of his wound.  Once again we  reminded him that he could have a problem with an eschar forming in his  thumb.  If this is the case, we will allow to heal by secondary  intention or consider a free skin graft.  There were no apparent  complications.   For aftercare, he is provided a prescription for Darvocet-N 100 one p.o.  q.6 h. p.r.n. pain, 20 tablets without refill.  He is advised to  initiate his analgesic treatment with Aleve 2 tabs in the morning and 2  tabs in the evening.      [name], M.D.  Electronically Signed     RVS/MEDQ  D:  12/23/2008  T:  12/24/2008  Job:  [id number]   cc:   [facility]

## 2010-12-13 NOTE — Op Note (Signed)
NAME:  [name], [name]               ACCOUNT NO.:  [id number]   MEDICAL RECORD NO.:  [id number]          PATIENT TYPE:  AMB   LOCATION:  SDS                          FACILITY:  [facility]   PHYSICIAN:  Gabrielle Dare. Janee Morn, M.D.DATE OF BIRTH:  07/29/1933   DATE OF PROCEDURE:  03/23/2009  DATE OF DISCHARGE:  03/23/2009                               OPERATIVE REPORT   PREOPERATIVE DIAGNOSES:  1. Cholelithiasis.  2. History of cholangitis.   POSTOPERATIVE DIAGNOSES:  1. Cholelithiasis.  2. History of cholangitis.   PROCEDURE:  Laparoscopic cholecystectomy with intraoperative  cholangiogram.   SURGEON:  Gabrielle Dare. Janee Morn, MD   ASSISTANTS:  1. Currie Paris, MD  2. Brayton El, PA-C   ANESTHESIA:  General endotracheal.   HISTORY OF PRESENT ILLNESS:  [name] is a 77 year old gentleman who  was admitted to the hospital with significant abdominal pain and  cholangitis in June.  He had severe sepsis and respiratory failure at  that time.  He does have a history of COPD, he was treated with  antibiotics and ERCP was done at that time by Dr. Evette Cristal from [facility].  Symptoms improved significantly and he was discharged home from the  hospital.  We evaluated in the office for cholecystectomy to prevent  recurrent bout of cholangitis.  He has felt very well since discharge  from the hospital and presents for cholecystectomy.   PROCEDURE IN DETAIL:  Informed consent was obtained.  The patient was  identified in the preoperative holding area.  He received intravenous  antibiotics.  He was brought to the operating room.  General  endotracheal anesthesia was administered by the anesthesia staff.  His  abdomen was prepped and draped in sterile fashion.  We did time-out  procedure.  Infraumbilical region was infiltrated with 0.25% Marcaine  with epinephrine.  An infraumbilical incision was made.  Subcutaneous  tissues were dissected down revealing the anterior fascia.  This was  divided  sharply, and the peritoneal cavity was entered under direct  vision.  A 0 Vicryl pursestring suture was placed around the fascial  opening.  A Hasson trocar was inserted in the abdomen.  The abdomen was  insufflated with carbon dioxide in standard fashion.  Under direct  vision, an 11-mm epigastric and two 5-mm lateral ports were placed.  There were several adhesions in the right upper quadrant.  These were  carefully taken down with sharp dissection under direct vision.  These  involved the omentum, and once I cleared away, the gallbladder was  visualized and actually had no adhesions directly on it, however, was  quite edematous.  The dome of the gallbladder was retracted  superomedially.  The infundibulum was retracted inferolaterally.  Dissection began laterally and progressed medially identifying the  cystic duct as well as the cystic artery.  Dissection continued until a  large window was created between the cystic duct, infundibulum of the  gallbladder, and the liver.  Once we had excellent visualization, a clip  was placed on the infundibulum cystic duct junction.  A small nick was  made in the cystic duct, and  Reddick cholangiogram catheter was  inserted.  Intraoperative cholangiogram was obtained demonstrating good  length of cystic duct and no common bile duct filling defects.  There  was good flow of contrast into the duodenum.  The cholangiogram catheter  was removed.  Three clips were placed proximally on the cystic duct and  it was divided.  Further dissection to fully delineate the cystic  artery, this was clipped twice proximally and once distally and divided.  The gallbladder was then taken off the liver bed using Bovie cautery.  It was placed into an EndoCatch bag and removed from the abdomen via the  infraumbilical port site.  The liver bed was rechecked and meticulous  hemostasis was assured using Bovie cautery until liver bed was  completely dry.  Clips were rechecked  and were in excellent position.  The omentum from the previous adhesions rechecked and excellent  hemostasis was obtained.  There was also a spot on the falciform  ligament and had some bleeding that had been cauterized and that was  rechecked and there was no bleeding whatsoever.  The abdomen was  copiously irrigated.  The irrigation fluid returned clear.  Liver bed  was rechecked and remained completely dry.  There was no other bleeding  seen.  Pneumoperitoneum was released.  Ports removed under direct  vision.  The infraumbilical fascia was closed by tying with 0 Vicryl  pursestring suture with care not to trap any intra-abdominal contents.  All four wounds were copiously irrigated.  Skin of each was closed with  running 4-0 Vicryl subcuticular stitch followed by Dermabond.  Sponge,  needle, and instrument counts were all correct.  The patient tolerated  the procedure well without apparent complication.  He was taken to the  recovery room in stable condition.      Gabrielle Dare Janee Morn, M.D.  Electronically Signed     BET/MEDQ  D:  03/23/2009  T:  03/24/2009  Job:  [id number]   cc:   Kela Millin, M.D.  Anna Genre Little, M.D.

## 2010-12-13 NOTE — Consult Note (Signed)
NAMEGRAYTON, LOBO               ACCOUNT NO.:  [id number]   MEDICAL RECORD NO.:  [id number]          PATIENT TYPE:  INP   LOCATION:  1342                         FACILITY:  [facility]   PHYSICIAN:  Antonietta Breach, M.D.  DATE OF BIRTH:  17-Aug-1932   DATE OF CONSULTATION:  01/27/2009  DATE OF DISCHARGE:  02/02/2009                                 CONSULTATION   ADDENDUM:  If Mr. Winnett does continue with nonremitting delirium  symptoms, would check other potential factors such as TSH, B12, folic  acid and RPR.      Antonietta Breach, M.D.  Electronically Signed     JW/MEDQ  D:  02/05/2009  T:  02/05/2009  Job:  [id number]

## 2010-12-13 NOTE — Discharge Summary (Signed)
[name], [name]               ACCOUNT NO.:  [id number]   MEDICAL RECORD NO.:  [id number]          PATIENT TYPE:  INP   LOCATION:  1227                         FACILITY:  [facility]   PHYSICIAN:  Charlcie Cradle. Delford Field, MD, FCCPDATE OF BIRTH:  12-08-1932   DATE OF ADMISSION:  01/17/2009  DATE OF DISCHARGE:  01/28/2009                               DISCHARGE SUMMARY   DISCHARGE DIAGNOSES:  1. Cholangitis with resultant severe sepsis/septic shock.  2. Protein calorie malnutrition.  3. Thrombocytopenia secondary to sepsis and disseminated intravascular      coagulation.  4. Acute respiratory failure secondary to acute lung injury/acute      respiratory distress syndrome complicated further by underlying      chronic obstructive pulmonary disease.  5. Arterial thromboembolism left lower extremity.  6. Agitated delirium with toxic metabolic encephalopathy.  7. Escherichia coli bacteremia secondary to cholangitis.   PROCEDURES:  1. Right internal jugular vein catheter placed January 18, 2009 and      removed January 27, 2009. 2.  Right radial A-line January 18, 2009 and      removed January 21, 2009.  2. Right radial A-line placed January 21, 2009 and removed January 22, 2009.  3. Oral endotracheal tube placed January 20, 2009 and reintubated on January 21, 2009.  4. He was finally extubated on January 26, 2009.  5. Right PICC line placed on January 27, 2009.   CULTURE DATA:  1. Blood cultures x2 on January 17, 2009 demonstrates E. coli.  2. Urine culture on January 17, 2009 negative.  3. Blood culture which was repeat on January 18, 2009 was negative.  4. Blood culture repeat on January 21, 2009 x2 were both negative.  5. Bronchioalveolar lavage on January 21, 2009 negative.  6. Urine culture on January 21, 2009 negative.   CONSULTANTS:  1. James L. Randa Evens, M.D. with Gastroenterology Service.  2. Antonietta Breach, M.D. with Psychiatric Services.   BRIEF HISTORY:  This is a 77 year old male patient with a history of  chronic  obstructive pulmonary disease, anxiety, leg edema,  diverticulosis, colon polyps, obstructive sleep apnea and heavy  alcoholism, presented to [facility] Emergency Room on January 17, 2009  with complaint of epigastric pain, nausea and vomiting.  Found to have  significant gallbladder sludge and stones.  He had an ERCP with  sphincterotomy on January 17, 2009.  Developed ensuing severe sepsis/septic  shock as well as respiratory failure.  The Pulmonary Critical Care  Service was asked to evaluate and assist with care.  Eventually taking  over care on January 18, 2009   HOSPITAL COURSE:  1. Cholangitis with resultant E. coli bacteremia, severe sepsis and      septic shock.  Mr. Needs was admitted to the intensive care      following progression of hemodynamic compromise.  Central access      was obtained.  He was given broad-spectrum antibiotics and the      early goal-directed therapy protocol was initiated.  He responded      favorably to aggressive volume resuscitation and  brief hemodynamic      pressor support.  These were eventually successfully weaned off.      Culture data as mentioned did show E. coli consistent with      cholangitis source.  Follow up cultures were negative as mentioned      above.  Ultimately antibiotics consisted initially of Cipro from      January 17, 2009 to January 19, 2009.  Vancomycin initiated on January 17, 2009, but discontinued on January 18, 2009 following E. coli findings      and essentially ended up on Primaxin from January 22, 2009 with stop      date set for January 30, 2009 to complete a total course therapy.      Upon time of dictation, Mr. Cargile has regained hemodynamic      stability.  He has no further evidence of acute organ dysfunction      and this finding is considered resolved.  This diagnosis is      considered resolved.  2. Thrombocytopenia secondary to sepsis coagulopathy/DIC.  Mr.      Vandervelden platelets actually dropped down to the sub-20 mark  during      his acute illness.  This was treated primarily with aggressive      supportive care.  Upon time of discharge, his platelets have      normalized and are currently 198.  3. Arterial thromboembolism.  Mr. Mcinerny did suffer sequelae of what      appears to be small arterial thromboembolic injury to the toes on      his right foot.  They do remained dusky,  purplish discolored,      however they are warm, and have good sensation at this point.  Plan      would be to continue to evaluate, allow them to demarcate and treat      supportively.  At this time, there is no indication for surgical      evaluation.  4. Agitated delirium in the setting of toxic metabolic encephalopathy,      further complicated by his alcohol withdrawal.  Mr. Farrior was      initially extubated following initial shock resuscitation, however,      his sensorium continued to worsen.  This was felt to be      multifactorial in the setting of hypoxia, evolving acute lung      injury, toxic metabolic encephalopathy and withdrawal from alcohol.      After being reintubated, he was placed on sedating drips.  These      included Fentanyl and Versed.  He was successfully extubated on a      Precedex drip.  At that point, he was transitioned to p.r.n. Haldol      and scheduled clonazepam.  Given the severity of his delirium and      the complicating issue of withdrawal,  we asked Dr. Jeanie Sewer to      assist with his care.  Upon time of discharge, he is still on      scheduled clonazepam.  The frequency is being decreased to q.12 h.      dosing schedule which is his home dosing schedule.  He is on      scheduled Haldol with p.r.n. for breakthrough agitation.  Of note,      he has markedly improved over the last 48 hours from an agitated      standpoint.  He still  has a fentanyl patch, we are weaning this to      off.  5. Acute respiratory failure in the setting of acute lung injury      secondary to severe   sepsis/septic shock.  Mr. Portnoy as previously mentioned did develop  progressive respiratory failure after initially being successfully  extubated.  He required emergent reintubation on January 20, 2009.  He was  managed on sedation drip and ARDS protocol  focusing on low-tidal volume  ventilation.  Additionally required aggressive diuresis to help  facilitate liberation from vent.  Upon time of discharge, Mr. Goin is  breathing without difficulty.  He does have some mild left lower lobe  atelectasis, but is actually off from supplemental oxygen.  As his  sensorium continues to improve, he can then be transitioned back to his  regular bronchodilator regimen.  1. Tachycardia.  For this, he was started on Lopressor.  2. Protein calorie malnutrition.  Mr. Villalpando has been receiving      supplemental tube feeds, however, he is now awaiting      speech/language pathology evaluation to evaluate swallowing      function.  At that point, he can advance diet as tolerated.   DISPOSITION:  Mr. Jantz has met full benefit from Critical Care  Services.  He is now medically clear for discharge to a regular ward at  which time his medical focus will be on Rehabilitative Services,  completing antibiotic course, advancing diet, then assessing discharge  needs.   DISCHARGE INSTRUCTIONS:  1. Upon time of sign-off, activity as tolerated with assistance of      physical therapy and occupational therapy.  2. Diet, currently receiving tube feeds, however, pending modified      barium swallow hopefully can advance this.  3. Medications, this will be deferred to the time of final discharge      summary from Primary Care Service.   The patient's care will now be reassumed by [facility].       Zenia Resides, NP      Charlcie Cradle. Delford Field, MD, [facility]  Electronically Signed    PB/MEDQ  D:  01/28/2009  T:  01/28/2009  Job:  [id number]

## 2010-12-13 NOTE — H&P (Signed)
[name], [name]               ACCOUNT NO.:  [id number]   MEDICAL RECORD NO.:  [id number]          PATIENT TYPE:  INP   LOCATION:  0101                         FACILITY:  [facility]   PHYSICIAN:  Michiel Cowboy, MDDATE OF BIRTH:  04-30-1933   DATE OF ADMISSION:  01/17/2009  DATE OF DISCHARGE:                              HISTORY & PHYSICAL   PRIMARY CARE PHYSICIAN:  Catha Gosselin, M.D.   CHIEF COMPLAINT:  Epigastric pain.   HISTORY OF PRESENT ILLNESS:  The patient is a 77 year old gentleman with  history of COPD and anxiety who since Friday, started to have  intermittent epigastric pain and nausea and vomiting.  At first he  attributed this to food he ate at lunch but the pain persisted.  He  finally presented at 2 o'clock in the morning to emergency department at  [facility] and was supposed to be admitted when his  labs were noted to be abnormal and he was thought to possible have  cholangitis but he refused and went home because he wanted to have a  lunch with his daughter.  After lunch, he took a nap and was actually  feeling not too bad but then he woke up and was somewhat confused. He  drove around the city trying to mail something and started to have  severe chills and fevers. At this point he finally presented back to  emergency department.  Surgery was called but felt that at this point he  would rather benefit from ERCP than an operation. GI was initially  called who felt that a medicine admission would be needed and antibiotic  coverage.  When I seen and examined the patient he appeared to be in  early sepsis.  I spoke again to GI and this time I reached Dr. Matthias Hughs,  who came down to see the patient and agreed that he will need a emergent  ERCP. At this point, [facility] is admitting the patient to an  ICU unit with a GI consult on board.   REVIEW OF SYSTEMS:  Otherwise review of systems significant fevers,  chills, epigastric pain but no  nausea and vomiting. No chest pain or  shortness of breath.  Otherwise review of systems unremarkable.   PAST MEDICAL HISTORY:  Significant for COPD and possible anxiety.  He  also has a history of leg swelling, for which he takes occasional  diuretic but he does not know the name or how much he is actually  taking. He did not bring his current list of medications.   SOCIAL HISTORY:  The patient used to smoke but quit 7 years ago.  Drinks  four to five alcoholic drinks per week, mainly social drinker. Otherwise  social history is noncontributory.   FAMILY HISTORY:  Father died of throat cancer in his 61s.  Mother died  of natural causes in her 59s.   ALLERGIES:  He does not remember but certain antibiotics have given him  a rash in the past, something he received after a routine oral  procedure.   MEDICATIONS:  He does not quite remember  what he is taking. Recently was  given prescription for Cipro.  Takes clonazepam 1 mg p.o. Was recently  given a prescription for potassium and Reglan. Takes Spiriva 80 mcg once  daily and Triamterene/chlorothiazide 37.5/25 mg daily.   PHYSICAL EXAMINATION:  VITAL SIGNS:  Temperature initially 103.0, heart  rate 134, now down to 120. Respiratory rate 20, sating 96% on room air.  Blood pressure 96/55.   LABORATORY DATA:  White blood cell count initially was 4.1, but now down  to 0.7, hemoglobin 15.6, platelets 115,000. D-dimer elevated to 12.  Sodium 140, potassium 4.9, creatinine 1.64, total bilirubin 5.3,  alkaline phosphatase 195, AST 959, ALT 560, lactic acid 5.8, Urine  negative.   CT of the chest showed no PE. steatosis of the liver but otherwise,  unremarkable. Ultrasound of the gallbladder revealed sludge and stones  in the gallbladder with slightly thickened gallbladder wall but no overt  signs of cholecystitis.   EKG showed no ischemic changes but otherwise sinus tachycardia.   ASSESSMENT/PLAN:  This is a 77 year old gentleman with  possible  cholangitis and sepsis.  1. CHOLANGITIS:  We will admit for IV antibiotics, IV fluids. Admit to      ICU. CCM has been consulted. GI was consulted. Dr. Matthias Hughs with      [facility] has seen the patient and planning on the emergent ERCP.      For antibiotic coverage will choose Zosyn and Cipro for double      coverage and vancomycin.  2. SEPSIS:  Admit to ICU. IV fluids and IV antibiotics.  CCM on board.  3. NEUTROPENIA:  Likely related to sepsis.  Will put on neutropenic      precaution and follow.  4. PROPHYLAXIS:  Protonix plus SCD's.  Avoid Lovenox,  as he may need      a procedure.   CODE STATUS:  The patient wishes to be a full code.  He stated that he  does have a Living Will. Does not want prolonged life support but does  wish to have attempt and trial of intubation and resuscitation if  necessary.      Michiel Cowboy, MD  Electronically Signed     AVD/MEDQ  D:  01/17/2009  T:  01/17/2009  Job:  [id number]   cc:   Caryn Bee L. Little, M.D.  Fax: [phone]

## 2010-12-13 NOTE — Discharge Summary (Signed)
NAMEAZIM, GILLINGHAM               ACCOUNT NO.:  [id number]   MEDICAL RECORD NO.:  [id number]          PATIENT TYPE:  INP   LOCATION:  1342                         FACILITY:  [facility]   PHYSICIAN:  Kela Millin, M.D.DATE OF BIRTH:  1933/05/12   DATE OF ADMISSION:  01/17/2009  DATE OF DISCHARGE:  02/02/2009                               DISCHARGE SUMMARY   ADDENDUM:  This is an addendum to the discharge summary dictated by Dr.  Jesse Sans care team on January 28, 2009, and it covers the period from  January 29, 2009 through February 02, 2009.   ADDENDUM TO PROCEDURES AND STUDIES:  Arterial Dopplers on January 30, 2009,  ABIs bilaterally 1.0.   ADDENDUM TO HOSPITAL COURSE:  1. Upon transfer to the regular medical floor by the critical care      team the patient was maintained on IV antibiotics which he      completed on January 30, 2009.  He has remained afebrile with no      leukocytosis since completion of his antibiotics, his last white      cell count today prior to discharge is 5.7.  While on the medical      floor the patient was noted to be anemic with a hemoglobin of 8.6      and a hematocrit of 25.5.  An anemia panel was done and his iron      level was 32 with a TIBC of 265, percent saturation of 12, Vitamin      B12 level of 1895 and serum folate greater than 20.  His ferritin      level 1082.  He did not have any gross evidence of bleeding, stool      guaiacs were ordered and one of the stool guaiacs was positive and      the other one was negative.  His hemoglobin was monitored and      rechecked and has remained stable at 8.7 with a hematocrit of 25.7.      The patient is asymptomatic.  He has a mixed iron deficiency and      chronic disease anemia.  He will be discharged on iron 325 mg      daily.  He is to follow up with his primary care physician for      monitoring of his hemoglobins and also to follow up with [facility]      [facility] - Dr. Matthias Hughs for further evaluation as     appropriate.  Again, as mentioned above, his repeat hemoccult today      is negative and he is hemodynamically stable with no gross      bleeding.  2. Dysphagia - the patient had an initial swallow evaluation upon      transfer to the medical floor and it revealed moderate oral      dysphagia.  He was placed on a dysphagia-2 diet with thin liquids.      He tolerated this diet well and as his overall clinical condition      continued to improve a repeat swallow evaluation was done  and the      speech therapist advanced his diet to a dysphagia-3 diet with thin      liquids.  Full supervision is still recommended and patient and      family instructed on aspiration precautions and to follow up with      the primary care physician.  3. Arterial thromboembolism - arterial Doppler ultrasounds were      ordered to evaluate this and they were done and were reported to be      within normal limits with ABIs of 1.0 bilaterally.  The purplish      discoloration has improved and his toes are still warm with good      sensation.  Patient is to follow up with his primary care physician      for further monitoring.  An outpatient referral to vascular surgery      to be considered when clinically appropriate.  4. Alcohol withdrawal - please see the details on the full discharge      summary dictated on January 28, 2009.  He was counseled to quit alcohol      and also was given community resources for quitting alcohol and he      and his wife have stated that they will follow up outpatient for      further counseling.   It was noted that the patient had been on Maxzide 37.5/25 mg prior to  admission and while in the ICU this was discontinued by the critical  care team and patient was placed on metoprolol 12.5 mg p.o. b.i.d.  He  is to continue this and follow up with his primary care physician for  further monitoring and management as clinically appropriate.   DISCHARGE MEDICATIONS:  1. Spiriva 80 mcg  1 puff daily.  2. Metoprolol 12.5 mg p.o. b.i.d.  3. Prilosec 40 mg one p.o. daily.  4. Xopenex MDI 2 puffs q.4-6 h. p.r.n.  5. Clonazepam changed to 1 mg at bedtime.  6. Iron sulfate 325 mg p.o. daily, follow up with PCP.  7. As above patient instructed to discontinue Maxzide, also to      discontinue potassium and the Cipro discontinued as well.   FOLLOW-UP CARE:  1. Dr. Catha Gosselin in 1-2 weeks, call for appointment.  2. Dr. Daphine Deutscher or first available appointment, call in 2 weeks for      appointment at [facility], [phone].  3. Dr. Matthias Hughs, [facility], in 3-4 weeks, call for follow-      up appointment.   DISCHARGE DIET:  Dysphagia-3 diet with thin liquids.   PT, OT and Home Health RN also to follow patient.   DISCHARGE CONDITION:  Improved/stable.      Kela Millin, M.D.  Electronically Signed     ACV/MEDQ  D:  02/02/2009  T:  02/02/2009  Job:  [id number]   cc:   Caryn Bee L. Little, M.D.  Fax: [phone]   Bernette Redbird, M.D.  Fax: [phone]   [place] Daphine Deutscher, MD  [address], [address]  [place]  [place] [address]

## 2010-12-16 NOTE — Op Note (Signed)
   NAME:  [name], [name]                         ACCOUNT NO.:  [id number]   MEDICAL RECORD NO.:  [id number]                   PATIENT TYPE:  AMB   LOCATION:  ENDO                                 FACILITY:  [facility]   PHYSICIAN:  Graylin Shiver, M.D.                DATE OF BIRTH:  1932-11-27   DATE OF PROCEDURE:  12/15/2002  DATE OF DISCHARGE:                                 OPERATIVE REPORT   PROCEDURE:  Colonoscopy with biopsy.   INDICATION FOR PROCEDURE:  Screening colonoscopy.   INFORMED CONSENT:  Informed consent was obtained.   PREMEDICATIONS:  1. Fentanyl 87.5 mcg IV.  2. Versed 7 mg IV.   DESCRIPTION OF PROCEDURE:  With the patient in the left lateral decubitus  position, a rectal exam was performed, and no masses were felt.  The Olympus  colonoscope was inserted into the rectum and advanced around the colon to  the cecum.  Cecal landmarks were identified.  The cecum and ascending colon  were normal.  In the mid transverse colon, there was a small 3-4 mm sessile  polyp removed with cold forceps.  The descending colon and sigmoid revealed  diverticulosis.  The rectum was normal.  He tolerated the procedure well  without complications.   IMPRESSION:  1. Small polyp in the transverse colon.  2. Diverticulosis of the sigmoid and descending colon.   PLAN:  The pathology will be checked.  If this is an adenomatous polyp, I  would recommend a follow-up colonoscopy again in 3-5 years.  The patient was  given a pamphlet on diverticulosis.                                               Graylin Shiver, M.D.    SFG/MEDQ  D:  12/15/2002  T:  12/15/2002  Job:  [id number]   cc:   Caryn Bee L. Little, M.D.  [address]  [place]  [place] [address]  Fax: [phone]

## 2012-02-28 ENCOUNTER — Other Ambulatory Visit: Payer: Self-pay | Admitting: Family Medicine

## 2012-02-28 DIAGNOSIS — M79604 Pain in right leg: Secondary | ICD-10-CM

## 2012-03-05 ENCOUNTER — Ambulatory Visit
Admission: RE | Admit: 2012-03-05 | Discharge: 2012-03-05 | Disposition: A | Discharge status: Home / Self Care | Payer: Medicare Other | Source: Ambulatory Visit | Attending: Family Medicine | Admitting: Family Medicine

## 2012-03-05 DIAGNOSIS — M79604 Pain in right leg: Secondary | ICD-10-CM

## 2012-05-22 ENCOUNTER — Ambulatory Visit: Payer: Medicare Other | Admitting: Family Medicine

## 2012-06-28 ENCOUNTER — Ambulatory Visit: Payer: Medicare Other | Admitting: Family Medicine

## 2012-07-31 DIAGNOSIS — R972 Elevated prostate specific antigen [PSA]: Secondary | ICD-10-CM

## 2012-07-31 HISTORY — DX: Elevated prostate specific antigen (PSA): R97.20

## 2013-01-06 ENCOUNTER — Encounter: Payer: Medicare Other | Admitting: Family Medicine

## 2013-01-28 ENCOUNTER — Other Ambulatory Visit: Payer: Self-pay | Admitting: Family Medicine

## 2013-01-28 DIAGNOSIS — Z87891 Personal history of nicotine dependence: Secondary | ICD-10-CM

## 2013-01-28 LAB — COMPREHENSIVE METABOLIC PANEL
Albumin: 4
Creat: 1.16
Potassium: 3.8 mmol/L
Sodium: 140 mmol/L (ref 137–147)
Total Bilirubin: 0.6 mg/dL

## 2013-01-29 ENCOUNTER — Ambulatory Visit
Admission: RE | Admit: 2013-01-29 | Discharge: 2013-01-29 | Disposition: A | Discharge status: Home / Self Care | Payer: Medicare Other | Source: Ambulatory Visit | Attending: Family Medicine | Admitting: Family Medicine

## 2013-01-29 DIAGNOSIS — Z87891 Personal history of nicotine dependence: Secondary | ICD-10-CM

## 2013-06-30 ENCOUNTER — Encounter: Payer: Self-pay | Admitting: Family Medicine

## 2013-06-30 ENCOUNTER — Ambulatory Visit (INDEPENDENT_AMBULATORY_CARE_PROVIDER_SITE_OTHER): Payer: Medicare Other | Admitting: Family Medicine

## 2013-06-30 VITALS — BP 122/74 | HR 96 | Temp 98.4°F | Ht 65.75 in | Wt 203.0 lb

## 2013-06-30 DIAGNOSIS — F1020 Alcohol dependence, uncomplicated: Secondary | ICD-10-CM | POA: Insufficient documentation

## 2013-06-30 DIAGNOSIS — Z8679 Personal history of other diseases of the circulatory system: Secondary | ICD-10-CM | POA: Insufficient documentation

## 2013-06-30 DIAGNOSIS — F32A Depression, unspecified: Secondary | ICD-10-CM

## 2013-06-30 DIAGNOSIS — Z87898 Personal history of other specified conditions: Secondary | ICD-10-CM | POA: Insufficient documentation

## 2013-06-30 DIAGNOSIS — Z87891 Personal history of nicotine dependence: Secondary | ICD-10-CM

## 2013-06-30 DIAGNOSIS — F329 Major depressive disorder, single episode, unspecified: Secondary | ICD-10-CM

## 2013-06-30 DIAGNOSIS — J4489 Other specified chronic obstructive pulmonary disease: Secondary | ICD-10-CM

## 2013-06-30 DIAGNOSIS — I10 Essential (primary) hypertension: Secondary | ICD-10-CM | POA: Insufficient documentation

## 2013-06-30 DIAGNOSIS — F1011 Alcohol abuse, in remission: Secondary | ICD-10-CM

## 2013-06-30 DIAGNOSIS — Z789 Other specified health status: Secondary | ICD-10-CM | POA: Insufficient documentation

## 2013-06-30 DIAGNOSIS — F3289 Other specified depressive episodes: Secondary | ICD-10-CM

## 2013-06-30 DIAGNOSIS — R972 Elevated prostate specific antigen [PSA]: Secondary | ICD-10-CM

## 2013-06-30 DIAGNOSIS — J449 Chronic obstructive pulmonary disease, unspecified: Secondary | ICD-10-CM | POA: Insufficient documentation

## 2013-06-30 NOTE — Assessment & Plan Note (Signed)
Stable on spiriva - continue.

## 2013-06-30 NOTE — Patient Instructions (Signed)
Good to meet you today, call us with questions. I will request records from Dr. Clarene Duke and Mena Goes.  Return as needed or in 6 months for follow up.

## 2013-06-30 NOTE — Assessment & Plan Note (Signed)
Stable on lexapro and klonopin - continue.

## 2013-06-30 NOTE — Assessment & Plan Note (Signed)
Recent CT reassuring - reviewed.

## 2013-06-30 NOTE — Assessment & Plan Note (Signed)
Chronic, stable on metoprolol tartrate once daily dosing and maxzide.

## 2013-06-30 NOTE — Progress Notes (Signed)
Subjective:    Patient ID: [name], male    DOB: 1933/07/05, 79 y.o.   MRN: [id number]  HPI CC: new pt to establish  Prior saw Dr. Clarene Duke - nearby.  HTN - No HA, vision changes, CP/tightness, SOB, leg swelling.  Compliant with and tolerating maxzide.  Notes leg swelling if doesn't take diuretic. Recently elevated PSA - saw Dr. Mena Goes.  rec f/u 6 mo (has appt next month). Had CT scan done recently - normal recent CT scan by Dr. Fredirick Maudlin office for h/o smoking.  Quit smoking 4 yrs ago.  Prior smoked 1 ppd for 50+ yrs. History alcohol abuse - 1989, currently drinks a few alcoholic drinks a week. COPD - on spiriva for this.  Denies dyspnea or cough.  Increased family stress over last 5 yrs - mother, MIL, FIL all deceased, and wife with significant medical problems, recently 3 spinal surgeries.  Recently started on lexapro.  Preventative: Last physical in July Colonoscopy 2009.  States due for repeat 2/2 h/o colon polyps.  Planning on rescheduling mid Jan ([place]) Prostate - elevated PSA recently - followed by Dr. Mena Goes. Tetanus 2010 Flu shot - done  Medications and allergies reviewed and updated in chart.  Past histories reviewed and updated if relevant as below. There are no active problems to display for this patient.  Past Medical History  Diagnosis Date  . HTN (hypertension)   . Elevated PSA 2014    Dr. Mena Goes  . History of smoking 2000    50+ PY hx  . Depression   . Choledocholithiasis 2009    sepsis s/p ICU stay with VDRF  . History of alcohol abuse 1989    after bad divorce   Past Surgical History  Procedure Laterality Date  . Tonsillectomy  1941  . Rectal fistula repair  1984  . Vasectomy  1969  . Cholecystectomy  2009   History  Substance Use Topics  . Smoking status: Former Smoker -- 1.00 packs/day for 50 years    Start date: 07/31/1948    Quit date: 07/31/1998  . Smokeless tobacco: Never Used  . Alcohol Use: Yes     Comment: 3 times a week    Family History  Problem Relation Age of Onset  . Cancer Father     throat?  Marland Kitchen Alcohol abuse Father   . Stroke Maternal Aunt     hemorrhagic  . Stroke Mother     ischemic  . Diabetes Neg Hx    Allergies  Allergen Reactions  . Sulfa Antibiotics Rash   No current outpatient prescriptions on file prior to visit.   No current facility-administered medications on file prior to visit.     Review of Systems Per HPI    Objective:   Physical Exam  Nursing note and vitals reviewed. Constitutional: He is oriented to person, place, and time. He appears well-developed and well-nourished. No distress.  HENT:  Head: Normocephalic and atraumatic.  Mouth/Throat: Oropharynx is clear and moist. No oropharyngeal exudate.  Eyes: Conjunctivae and EOM are normal. Pupils are equal, round, and reactive to light. No scleral icterus.  Neck: Normal range of motion. Neck supple. Carotid bruit is not present.  Cardiovascular: Normal rate, regular rhythm, normal heart sounds and intact distal pulses.   No murmur heard. Pulses:      Radial pulses are 2+ on the right side, and 2+ on the left side.  Pulmonary/Chest: Effort normal and breath sounds normal. No respiratory distress. He has no wheezes.  He has no rales.  Musculoskeletal: Normal range of motion. He exhibits no edema.  Lymphadenopathy:    He has no cervical adenopathy.  Neurological: He is alert and oriented to person, place, and time.  CN grossly intact, station and gait intact  Skin: Skin is warm and dry. No rash noted.  Psychiatric: He has a normal mood and affect. His behavior is normal. Judgment and thought content normal.       Assessment & Plan:

## 2013-06-30 NOTE — Assessment & Plan Note (Signed)
Will await records from urology.

## 2013-07-23 ENCOUNTER — Encounter: Payer: Self-pay | Admitting: *Deleted

## 2013-07-28 ENCOUNTER — Encounter: Payer: Self-pay | Admitting: Family Medicine

## 2013-07-31 ENCOUNTER — Encounter: Payer: Self-pay | Admitting: Family Medicine

## 2013-07-31 DIAGNOSIS — B369 Superficial mycosis, unspecified: Secondary | ICD-10-CM

## 2013-07-31 DIAGNOSIS — H9113 Presbycusis, bilateral: Secondary | ICD-10-CM

## 2013-07-31 HISTORY — DX: Presbycusis, bilateral: H91.13

## 2013-07-31 HISTORY — DX: Otitis externa in other diseases classified elsewhere, unspecified ear: B36.9

## 2013-07-31 HISTORY — PX: COLONOSCOPY: SHX174

## 2013-08-06 LAB — HM COLONOSCOPY: HM Colonoscopy: NEGATIVE

## 2013-08-07 ENCOUNTER — Encounter: Payer: Self-pay | Admitting: *Deleted

## 2013-08-07 ENCOUNTER — Other Ambulatory Visit: Payer: Self-pay

## 2013-08-07 ENCOUNTER — Encounter: Payer: Self-pay | Admitting: Family Medicine

## 2013-08-07 MED ORDER — METOPROLOL TARTRATE 25 MG PO TABS: 25.0000 mg | ORAL_TABLET | Freq: Every day | ORAL | Status: DC

## 2013-08-07 MED ORDER — TIOTROPIUM BROMIDE MONOHYDRATE 18 MCG IN CAPS: 18.0000 ug | ORAL_CAPSULE | Freq: Every day | RESPIRATORY_TRACT | Status: DC

## 2013-08-07 MED ORDER — CLONAZEPAM 1 MG PO TABS: 1.0000 mg | ORAL_TABLET | Freq: Every evening | ORAL | Status: DC | PRN

## 2013-08-07 MED ORDER — TRIAMTERENE-HCTZ 37.5-25 MG PO TABS: 1.0000 | ORAL_TABLET | ORAL | Status: DC

## 2013-08-07 NOTE — Telephone Encounter (Signed)
plz phone in clonazepam 

## 2013-08-07 NOTE — Telephone Encounter (Signed)
Pt left v/m requesting 90 day refill metoprolol, spiriva, clonazepam and triamterene HCTZ to [facility] [address]. Pt said he takes triamterene-HCTZ 37.5-25 mg taking one tab every other day. On pts med list instructions are take one daily.Pt said Dr Reece AgarG has never filled meds before.Please advise.

## 2013-08-07 NOTE — Telephone Encounter (Signed)
Refilled as requested. Changed triam-hctz on med list per patient confirmation. Ok to refill clonazepam?

## 2013-08-08 NOTE — Telephone Encounter (Signed)
Rx called in as directed.   

## 2013-08-28 ENCOUNTER — Ambulatory Visit: Payer: Medicare Other | Admitting: Family Medicine

## 2013-10-07 ENCOUNTER — Ambulatory Visit: Payer: Self-pay | Admitting: Family Medicine

## 2013-10-13 ENCOUNTER — Other Ambulatory Visit: Payer: Self-pay

## 2013-10-13 MED ORDER — CLONAZEPAM 1 MG PO TABS: 1.0000 mg | ORAL_TABLET | Freq: Every evening | ORAL | Status: DC | PRN

## 2013-10-13 NOTE — Telephone Encounter (Signed)
Pt left v/m requesting refill clonazepam to [facility] [address].Please advise.

## 2013-10-13 NOTE — Telephone Encounter (Signed)
Rx called in as directed.   

## 2013-10-13 NOTE — Telephone Encounter (Signed)
plz phone in. 

## 2013-10-14 ENCOUNTER — Encounter: Payer: Self-pay | Admitting: Family Medicine

## 2013-10-22 ENCOUNTER — Telehealth: Payer: Self-pay | Admitting: Family Medicine

## 2013-10-22 NOTE — Telephone Encounter (Signed)
Pt sent MyChart message below:  Lab work for liver -- then prescription for Revia or Depada (50 mg) to inhibit alcohol consumption. Phone [phone]  Please advise

## 2013-10-23 ENCOUNTER — Ambulatory Visit (INDEPENDENT_AMBULATORY_CARE_PROVIDER_SITE_OTHER): Payer: Medicare Other | Admitting: Family Medicine

## 2013-10-23 ENCOUNTER — Encounter: Payer: Self-pay | Admitting: Family Medicine

## 2013-10-23 VITALS — BP 122/86 | HR 89 | Temp 98.0°F | Wt 215.5 lb

## 2013-10-23 DIAGNOSIS — I10 Essential (primary) hypertension: Secondary | ICD-10-CM

## 2013-10-23 DIAGNOSIS — F32A Depression, unspecified: Secondary | ICD-10-CM

## 2013-10-23 DIAGNOSIS — F329 Major depressive disorder, single episode, unspecified: Secondary | ICD-10-CM

## 2013-10-23 DIAGNOSIS — F1011 Alcohol abuse, in remission: Secondary | ICD-10-CM

## 2013-10-23 DIAGNOSIS — F3289 Other specified depressive episodes: Secondary | ICD-10-CM

## 2013-10-23 MED ORDER — SERTRALINE HCL 50 MG PO TABS: 50.0000 mg | ORAL_TABLET | Freq: Every day | ORAL | Status: DC

## 2013-10-23 MED ORDER — NALTREXONE HCL 50 MG PO TABS: 50.0000 mg | ORAL_TABLET | Freq: Every day | ORAL | Status: DC

## 2013-10-23 NOTE — Progress Notes (Signed)
Pre visit review using our clinic review tool, if applicable. No additional management support is needed unless otherwise documented below in the visit note. 

## 2013-10-23 NOTE — Telephone Encounter (Signed)
Seen today. 

## 2013-10-23 NOTE — Progress Notes (Signed)
BP 122/86  Pulse 89  Temp(Src) 98 F (36.7 C) (Oral)  Wt 215 lb 8 oz (97.75 kg)  SpO2 95%   CC: discuss alcohol use  Subjective:    Patient ID: [name], male    DOB: [birth year], 80 y.o.   MRN: [id number]  HPI: [name] is a 80 y.o. male presenting on 10/23/2013 for Requests labs   Has been married to wife for last 9 years.  Over first several years, traveled world.  Last few years, spending more time in [facility] hospital than anywhere else.  Wife has had several medical issues including multiple spinal surgeries, perforated ulcer, etc. Several friends have died recently - one died last night. Prior on lexapro 10mg  daily and stopped this several months ago.  Did not see any improvement in mood on med. Has noticed increased drinking - doesn't regularly drink but when he does the main issue is with binging.  Drinks once or twice a month, drinks 1 pint vodka in 1 sitting.  No beer, no wine.  When he goes out to drink, has someone drive him back and doesn't go out with wife.  No legal issues or DUI in past.  This is causing significant stress with wife.  Wife has stopped drinking in an efford to help him stop as well. Last drink was Friday 6 days ago.  No h/o withdrawals, no h/o DT. Feels sure he's depressed.  Has appt April 6th with counselor Colen Darling to discuss life stressors and depression.  Depression - PHQ9 performed today.  Denies anxiety or worries. Requests liver function then trial of revia  Relevant past medical, surgical, family and social history reviewed and updated as indicated.  Allergies and medications reviewed and updated. Current Outpatient Prescriptions on File Prior to Visit  Medication Sig  . aspirin 81 MG tablet Take 81 mg by mouth daily.  . cholecalciferol (VITAMIN D) 1000 UNITS tablet Take 1,000 Units by mouth daily.  . clonazePAM (KLONOPIN) 1 MG tablet Take 1 tablet (1 mg total) by mouth at bedtime as needed for anxiety.  . Coenzyme Q10  (COQ-10) 50 MG CAPS Take by mouth daily.  . Flaxseed, Linseed, (FLAX SEED OIL PO) Take 1,200 Units by mouth every other day.  . metoprolol tartrate (LOPRESSOR) 25 MG tablet Take 1 tablet (25 mg total) by mouth daily.  . Milk Thistle 250 MG CAPS Take by mouth daily.  . Multiple Vitamin (MULTIVITAMIN) tablet Take 1 tablet by mouth daily.  . multivitamin-lutein (OCUVITE-LUTEIN) CAPS capsule Take 1 capsule by mouth daily.  . Omega-3 Fatty Acids (FISH OIL) 1000 MG CAPS Take 2,000 each by mouth daily.  . Potassium Aminobenzoate 500 MG TABS Take by mouth daily.  Marland Kitchen tiotropium (SPIRIVA) 18 MCG inhalation capsule Place 1 capsule (18 mcg total) into inhaler and inhale daily.  Marland Kitchen triamterene-hydrochlorothiazide (MAXZIDE-25) 37.5-25 MG per tablet Take 1 tablet by mouth every other day.  Marland Kitchen Zn-Pyg Afri-Nettle-Saw Palmet (SAW PALMETTO COMPLEX PO) Take 2,500 mg by mouth daily.   No current facility-administered medications on file prior to visit.    Review of Systems Per HPI unless specifically indicated above    Objective:    BP 122/86  Pulse 89  Temp(Src) 98 F (36.7 C) (Oral)  Wt 215 lb 8 oz (97.75 kg)  SpO2 95%  Physical Exam  Nursing note and vitals reviewed. Constitutional: He appears well-developed and well-nourished. No distress.  Psychiatric: He has a normal mood and affect. His speech is  normal and behavior is normal. Judgment and thought content normal. Cognition and memory are normal.   Results for orders placed in visit on 08/07/13  HM COLONOSCOPY      Result Value Ref Range   HM Colonoscopy Normal/Negative        Assessment & Plan:   Problem List Items Addressed This Visit   Depression - Primary     PHQ9 = 5/27, somewhat difficult to function. Despite low PHQ9 I believe main issue is return of major depressive episode leading to increase in binging. Discussed with patient - agree with counseling as well as recommended restart zoloft - start at 25mg  daily then increase to 50mg   daily.  Did not notice response to lexapro in past. rtc 1 mo for f/u.  Does not endorse SI/HI.    Relevant Medications      sertraline (ZOLOFT) tablet   Other Relevant Orders      Comprehensive metabolic panel      TSH   History of alcohol abuse     Increase in alcohol intake noted over last several months, causing increased marital conflict. Pt motivated to quit alcohol, requests trial of naltrexone. Discussed possible side effects from UTD of syncope, HTN, dizziness, gi distress and headache. Check LFTs today, then recheck in 1-2 months at f/u. A total of 25 minutes were spent face-to-face with the patient during this encounter and over half of that time was spent on counseling and coordination of care    Relevant Orders      Comprehensive metabolic panel      TSH   HTN (hypertension)   Relevant Orders      Comprehensive metabolic panel      TSH       Follow up plan: Return in about 1 month (around 11/23/2013), or if symptoms worsen or fail to improve, for follow up.

## 2013-10-23 NOTE — Assessment & Plan Note (Signed)
Increase in alcohol intake noted over last several months, causing increased marital conflict. Pt motivated to quit alcohol, requests trial of naltrexone. Discussed possible side effects from UTD of syncope, HTN, dizziness, gi distress and headache. Check LFTs today, then recheck in 1-2 months at f/u. A total of 25 minutes were spent face-to-face with the patient during this encounter and over half of that time was spent on counseling and coordination of care

## 2013-10-23 NOTE — Patient Instructions (Signed)
Start zoloft (sertraline) 1/2 tablet daily for 1 week then increase to 1 tablet daily Start revia (depada) 1/2 tablet daily for first 3 days then increase to 1 tablet daily. Keep appointment with couselor Liver function check today. Return to see me in 1 month for follow up

## 2013-10-23 NOTE — Assessment & Plan Note (Signed)
PHQ9 = 5/27, somewhat difficult to function. Despite low PHQ9 I believe main issue is return of major depressive episode leading to increase in binging. Discussed with patient - agree with counseling as well as recommended restart zoloft - start at 25mg  daily then increase to 50mg  daily.  Did not notice response to lexapro in past. rtc 1 mo for f/u.  Does not endorse SI/HI.

## 2013-10-24 LAB — COMPREHENSIVE METABOLIC PANEL
ALBUMIN: 3.9 g/dL (ref 3.5–5.2)
ALK PHOS: 52 U/L (ref 39–117)
ALT: 21 U/L (ref 0–53)
AST: 24 U/L (ref 0–37)
BILIRUBIN TOTAL: 0.3 mg/dL (ref 0.3–1.2)
BUN: 17 mg/dL (ref 6–23)
CO2: 25 mEq/L (ref 19–32)
Calcium: 9.4 mg/dL (ref 8.4–10.5)
Chloride: 104 mEq/L (ref 96–112)
Creatinine, Ser: 1.3 mg/dL (ref 0.4–1.5)
GFR: 59.3 mL/min — ABNORMAL LOW (ref >60.00)
GLUCOSE: 91 mg/dL (ref 70–99)
POTASSIUM: 3.9 meq/L (ref 3.5–5.1)
SODIUM: 138 meq/L (ref 135–145)
TOTAL PROTEIN: 6.9 g/dL (ref 6.0–8.3)

## 2013-10-24 LAB — TSH: TSH: 1.12 u[IU]/mL (ref 0.35–5.50)

## 2013-10-27 ENCOUNTER — Encounter: Payer: Self-pay | Admitting: Family Medicine

## 2013-11-24 ENCOUNTER — Ambulatory Visit: Payer: Medicare Other | Admitting: Family Medicine

## 2013-11-25 ENCOUNTER — Encounter: Payer: Self-pay | Admitting: *Deleted

## 2013-11-25 ENCOUNTER — Encounter: Payer: Self-pay | Admitting: Family Medicine

## 2013-11-25 ENCOUNTER — Ambulatory Visit (INDEPENDENT_AMBULATORY_CARE_PROVIDER_SITE_OTHER): Payer: Medicare Other | Admitting: Family Medicine

## 2013-11-25 VITALS — BP 104/60 | HR 76 | Temp 98.0°F | Wt 209.2 lb

## 2013-11-25 DIAGNOSIS — F3289 Other specified depressive episodes: Secondary | ICD-10-CM

## 2013-11-25 DIAGNOSIS — F329 Major depressive disorder, single episode, unspecified: Secondary | ICD-10-CM

## 2013-11-25 DIAGNOSIS — F1011 Alcohol abuse, in remission: Secondary | ICD-10-CM

## 2013-11-25 DIAGNOSIS — F32A Depression, unspecified: Secondary | ICD-10-CM

## 2013-11-25 DIAGNOSIS — G4700 Insomnia, unspecified: Secondary | ICD-10-CM

## 2013-11-25 MED ORDER — CLONAZEPAM 1 MG PO TABS: 1.0000 mg | ORAL_TABLET | Freq: Every evening | ORAL | Status: DC | PRN

## 2013-11-25 NOTE — Assessment & Plan Note (Signed)
No drink in 5 weeks - attributes to willpower.  Naltrexone may have helped initially.  Now off med. Congratulated and encouraged continued abstinence.

## 2013-11-25 NOTE — Progress Notes (Signed)
BP 104/60  Pulse 76  Temp(Src) 98 F (36.7 C) (Oral)  Wt 209 lb 4 oz (94.915 kg)   CC: 1 mo f/u  Subjective:    Patient ID: [name], male    DOB: 08/24/1932, 80 y.o.   MRN: [id number]  HPI: Kevin ShipperRobert D Schommer is a 80 y.o. male presenting on 11/25/2013 for Follow-up   Pt upset he had to wait 45 min for appointment today.  See prior note for details. Both naltrexone and sertraline together caused oversedation. Stopped sertraline. 1 wk later stopped naltrexone. No drink in 5 weeks - attributes to willpower.  Moodwise feels overwhelmed with current life stressors.  Planning on move to [place] within the month, but would like to return to see me here.  Declines further medication.  Requests refill of klonopin 0.5mg  nightly for sleep.  Long-term medication.  Endorses vivid dreams.  Relevant past medical, surgical, family and social history reviewed and updated as indicated.  Allergies and medications reviewed and updated. Current Outpatient Prescriptions on File Prior to Visit  Medication Sig  . aspirin 81 MG tablet Take 81 mg by mouth daily.  . cholecalciferol (VITAMIN D) 1000 UNITS tablet Take 1,000 Units by mouth daily.  . Coenzyme Q10 (COQ-10) 50 MG CAPS Take by mouth daily.  . Flaxseed, Linseed, (FLAX SEED OIL PO) Take 1,200 Units by mouth every other day.  . metoprolol tartrate (LOPRESSOR) 25 MG tablet Take 1 tablet (25 mg total) by mouth daily.  . Milk Thistle 250 MG CAPS Take by mouth daily.  . Multiple Vitamin (MULTIVITAMIN) tablet Take 1 tablet by mouth daily.  . multivitamin-lutein (OCUVITE-LUTEIN) CAPS capsule Take 1 capsule by mouth daily.  Marland Kitchen. tiotropium (SPIRIVA) 18 MCG inhalation capsule Place 1 capsule (18 mcg total) into inhaler and inhale daily.  Marland Kitchen. triamterene-hydrochlorothiazide (MAXZIDE-25) 37.5-25 MG per tablet Take 1 tablet by mouth every other day.  Marland Kitchen. Zn-Pyg Afri-Nettle-Saw Palmet (SAW PALMETTO COMPLEX PO) Take 2,500 mg by mouth daily.  . Omega-3  Fatty Acids (FISH OIL) 1000 MG CAPS Take 2,000 each by mouth daily.  . Potassium Aminobenzoate 500 MG TABS Take by mouth daily.   No current facility-administered medications on file prior to visit.    Review of Systems Per HPI unless specifically indicated above    Objective:    BP 104/60  Pulse 76  Temp(Src) 98 F (36.7 C) (Oral)  Wt 209 lb 4 oz (94.915 kg)  Physical Exam  Nursing note and vitals reviewed. Constitutional: He appears well-developed and well-nourished. No distress.  Psychiatric: He has a normal mood and affect.  Initially upset but then pleasant and makes jokes   Results for orders placed in visit on 10/23/13  COMPREHENSIVE METABOLIC PANEL      Result Value Ref Range   Sodium 138  135 - 145 mEq/L   Potassium 3.9  3.5 - 5.1 mEq/L   Chloride 104  96 - 112 mEq/L   CO2 25  19 - 32 mEq/L   Glucose, Bld 91  70 - 99 mg/dL   BUN 17  6 - 23 mg/dL   Creatinine, Ser 1.3  0.4 - 1.5 mg/dL   Total Bilirubin 0.3  0.3 - 1.2 mg/dL   Alkaline Phosphatase 52  39 - 117 U/L   AST 24  0 - 37 U/L   ALT 21  0 - 53 U/L   Total Protein 6.9  6.0 - 8.3 g/dL   Albumin 3.9  3.5 - 5.2 g/dL  Calcium 9.4  8.4 - 10.5 mg/dL   GFR 16.1059.30 (*) >96.04>60.00 mL/min  TSH      Result Value Ref Range   TSH 1.12  0.35 - 5.50 uIU/mL      Assessment & Plan:  Sertraline and naltrexone handed to Selena BattenKim to dispose of.  Problem List Items Addressed This Visit   Depression - Primary     Off sertraline - too sedating.    History of alcohol abuse     No drink in 5 weeks - attributes to willpower.  Naltrexone may have helped initially.  Now off med. Congratulated and encouraged continued abstinence.    Insomnia     Long-term medication. Encouraged to slowly try taper off med with use only PRN.        Follow up plan: Return as needed.

## 2013-11-25 NOTE — Patient Instructions (Signed)
Good to see you today. I'm glad you no longer need either meds.   Let me know if drinking trouble returns.

## 2013-11-25 NOTE — Assessment & Plan Note (Addendum)
Off sertraline - too sedating.

## 2013-11-25 NOTE — Assessment & Plan Note (Signed)
Long-term medication. Encouraged to slowly try taper off med with use only PRN.

## 2013-11-25 NOTE — Progress Notes (Signed)
Pre visit review using our clinic review tool, if applicable. No additional management support is needed unless otherwise documented below in the visit note. 

## 2014-01-14 ENCOUNTER — Encounter: Payer: Self-pay | Admitting: Family Medicine

## 2014-01-27 ENCOUNTER — Other Ambulatory Visit: Payer: Self-pay | Admitting: Family Medicine

## 2014-01-27 DIAGNOSIS — F1011 Alcohol abuse, in remission: Secondary | ICD-10-CM

## 2014-01-27 DIAGNOSIS — D7589 Other specified diseases of blood and blood-forming organs: Secondary | ICD-10-CM

## 2014-01-27 DIAGNOSIS — I10 Essential (primary) hypertension: Secondary | ICD-10-CM

## 2014-02-03 ENCOUNTER — Other Ambulatory Visit (INDEPENDENT_AMBULATORY_CARE_PROVIDER_SITE_OTHER): Payer: Medicare Other

## 2014-02-03 DIAGNOSIS — F1011 Alcohol abuse, in remission: Secondary | ICD-10-CM

## 2014-02-03 DIAGNOSIS — I10 Essential (primary) hypertension: Secondary | ICD-10-CM

## 2014-02-03 DIAGNOSIS — D7589 Other specified diseases of blood and blood-forming organs: Secondary | ICD-10-CM

## 2014-02-03 LAB — CBC WITH DIFFERENTIAL/PLATELET
BASOS ABS: 0 10*3/uL (ref 0.0–0.1)
BASOS PCT: 0.5 % (ref 0.0–3.0)
EOS ABS: 0.2 10*3/uL (ref 0.0–0.7)
Eosinophils Relative: 3.2 % (ref 0.0–5.0)
HCT: 47.9 % (ref 39.0–52.0)
HEMOGLOBIN: 16.2 g/dL (ref 13.0–17.0)
LYMPHS ABS: 1.6 10*3/uL (ref 0.7–4.0)
Lymphocytes Relative: 32.1 % (ref 12.0–46.0)
MCHC: 33.8 g/dL (ref 30.0–36.0)
MCV: 106.3 fl — AB (ref 78.0–100.0)
MONO ABS: 0.5 10*3/uL (ref 0.1–1.0)
Monocytes Relative: 10.6 % (ref 3.0–12.0)
NEUTROS ABS: 2.7 10*3/uL (ref 1.4–7.7)
Neutrophils Relative %: 53.6 % (ref 43.0–77.0)
Platelets: 151 10*3/uL (ref 150.0–400.0)
RBC: 4.51 Mil/uL (ref 4.22–5.81)
RDW: 15.1 % (ref 11.5–15.5)
WBC: 5 10*3/uL (ref 4.0–10.5)

## 2014-02-03 LAB — LIPID PANEL
CHOL/HDL RATIO: 3
Cholesterol: 162 mg/dL (ref 0–200)
HDL: 60 mg/dL (ref >39.00)
LDL Cholesterol: 90 mg/dL (ref 0–99)
NONHDL: 102
Triglycerides: 58 mg/dL (ref 0.0–149.0)
VLDL: 11.6 mg/dL (ref 0.0–40.0)

## 2014-02-03 LAB — BASIC METABOLIC PANEL
BUN: 16 mg/dL (ref 6–23)
CALCIUM: 9.5 mg/dL (ref 8.4–10.5)
CHLORIDE: 104 meq/L (ref 96–112)
CO2: 28 meq/L (ref 19–32)
Creatinine, Ser: 1.2 mg/dL (ref 0.4–1.5)
GFR: 63.33 mL/min (ref >60.00)
GLUCOSE: 118 mg/dL — AB (ref 70–99)
Potassium: 4.2 mEq/L (ref 3.5–5.1)
SODIUM: 139 meq/L (ref 135–145)

## 2014-02-03 LAB — VITAMIN B12: Vitamin B-12: 832 pg/mL (ref 211–911)

## 2014-02-04 ENCOUNTER — Ambulatory Visit: Payer: Medicare Other

## 2014-02-04 DIAGNOSIS — D7589 Other specified diseases of blood and blood-forming organs: Secondary | ICD-10-CM

## 2014-02-04 LAB — FOLATE: Folate: 20.8 ng/mL (ref >5.9)

## 2014-02-10 ENCOUNTER — Encounter: Payer: Self-pay | Admitting: Family Medicine

## 2014-02-10 ENCOUNTER — Ambulatory Visit (INDEPENDENT_AMBULATORY_CARE_PROVIDER_SITE_OTHER): Payer: Medicare Other | Admitting: Family Medicine

## 2014-02-10 VITALS — BP 138/82 | HR 68 | Temp 97.6°F | Ht 65.75 in | Wt 204.5 lb

## 2014-02-10 DIAGNOSIS — F1011 Alcohol abuse, in remission: Secondary | ICD-10-CM

## 2014-02-10 DIAGNOSIS — Z0000 Encounter for general adult medical examination without abnormal findings: Secondary | ICD-10-CM

## 2014-02-10 DIAGNOSIS — Z23 Encounter for immunization: Secondary | ICD-10-CM

## 2014-02-10 DIAGNOSIS — S335XXA Sprain of ligaments of lumbar spine, initial encounter: Secondary | ICD-10-CM

## 2014-02-10 DIAGNOSIS — R972 Elevated prostate specific antigen [PSA]: Secondary | ICD-10-CM

## 2014-02-10 DIAGNOSIS — J449 Chronic obstructive pulmonary disease, unspecified: Secondary | ICD-10-CM

## 2014-02-10 DIAGNOSIS — I10 Essential (primary) hypertension: Secondary | ICD-10-CM

## 2014-02-10 DIAGNOSIS — S39012A Strain of muscle, fascia and tendon of lower back, initial encounter: Secondary | ICD-10-CM

## 2014-02-10 NOTE — Assessment & Plan Note (Signed)
Desires to have this followed today. Prior followed by Dr. Mena GoesEskridge. Will need PSA checked next labwork.

## 2014-02-10 NOTE — Assessment & Plan Note (Signed)
Stable with decreased use of spiriva.

## 2014-02-10 NOTE — Assessment & Plan Note (Signed)
Decreased use

## 2014-02-10 NOTE — Progress Notes (Signed)
BP 138/82  Pulse 68  Temp(Src) 97.6 F (36.4 C) (Oral)  Ht 5' 5.75" (1.67 m)  Wt 204 lb 8 oz (92.761 kg)  BMI 33.26 kg/m2   CC: medicare wellness  Subjective:    Patient ID: [name], male    DOB: 1933-02-21, 80 y.o.   MRN: [id number]  HPI: [name] is a 80 y.o. male presenting on 02/10/2014 for Annual Exam   Would like growth on left shoulder removed. Back pain - worse pain when he wakes up in am - sleeps on his abdomen. Bilateral flank pain.  Asks about referral for PT. No fevers/chills, radiculopathy down legs, bowel/bladder incontinence. Has been told has OSA - refuses CPAP machine/mask. Recent move to [place] - 1 story house for wife's mobility.  Has backed off spiriva - doesn't think he needs this medication.  Quit drinking - significantly. Still drinks occasionally but only 1-2 drinks and not every day.  Has dental work pending.  Passes vision screens today. Wears hearing aides.  Denies falls or depression/anhedonia or sadness.   Preventative: COLONOSCOPY Date: 07/2013 no polyps [name] Prostate - elevated PSA in past with normal biopsy per patient - this was followed by Dr. Mena Goes in past Q6 mo active surveillance. Has not returned since 01/2013. Would like to follow up here for now Tetanus 2010  Flu shot - 03/2013 Pneumovax 07/2002. prevnar: today Tdap 2012 zostavax 2008 Advanced directives: has this at home, will bring copy. Wife [name] is HCPOA.  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 Diet: good water, fruits/vegetables daily. Significant sweet tea.  Relevant past medical, surgical, family and social history reviewed and updated as indicated.  Allergies and medications reviewed and updated. Current Outpatient Prescriptions on File Prior to Visit  Medication Sig  . aspirin 81 MG tablet Take 81  mg by mouth daily.  . cholecalciferol (VITAMIN D) 1000 UNITS tablet Take 1,000 Units by mouth daily.  . clonazePAM (KLONOPIN) 1 MG tablet Take 1 tablet (1 mg total) by mouth at bedtime as needed for anxiety.  . Coenzyme Q10 (COQ-10) 50 MG CAPS Take by mouth daily.  . Flaxseed, Linseed, (FLAX SEED OIL PO) Take 1,200 Units by mouth every other day.  . Milk Thistle 250 MG CAPS Take by mouth daily.  . Multiple Vitamin (MULTIVITAMIN) tablet Take 1 tablet by mouth daily.  . multivitamin-lutein (OCUVITE-LUTEIN) CAPS capsule Take 1 capsule by mouth daily.  . Omega-3 Fatty Acids (FISH OIL) 1000 MG CAPS Take 2,000 each by mouth daily.  . Potassium Aminobenzoate 500 MG TABS Take by mouth daily.  Marland Kitchen triamterene-hydrochlorothiazide (MAXZIDE-25) 37.5-25 MG per tablet Take 1 tablet by mouth every other day.  Marland Kitchen Zn-Pyg Afri-Nettle-Saw Palmet (SAW PALMETTO COMPLEX PO) Take 2,500 mg by mouth daily.  Marland Kitchen tiotropium (SPIRIVA) 18 MCG inhalation capsule Place 1 capsule (18 mcg total) into inhaler and inhale daily.   No current facility-administered medications on file prior to visit.    Review of Systems Per HPI unless specifically indicated above    Objective:    BP 138/82  Pulse 68  Temp(Src) 97.6 F (36.4 C) (Oral)  Ht 5' 5.75" (1.67 m)  Wt 204 lb 8 oz (92.761 kg)  BMI 33.26 kg/m2  Physical Exam  Nursing note and vitals reviewed. Constitutional: He is oriented to person, place, and time. He appears well-developed and  well-nourished. No distress.  HENT:  Head: Normocephalic and atraumatic.  Right Ear: Hearing, tympanic membrane, external ear and ear canal normal.  Left Ear: Hearing, tympanic membrane, external ear and ear canal normal.  Nose: Nose normal.  Mouth/Throat: Uvula is midline, oropharynx is clear and moist and mucous membranes are normal. No oropharyngeal exudate, posterior oropharyngeal edema or posterior oropharyngeal erythema.  Eyes: Conjunctivae and EOM are normal. Pupils are equal,  round, and reactive to light. No scleral icterus.  Neck: Normal range of motion. Neck supple.  Cardiovascular: Normal rate, regular rhythm, normal heart sounds and intact distal pulses.   No murmur heard. Pulses:      Radial pulses are 2+ on the right side, and 2+ on the left side.  Pulmonary/Chest: Effort normal and breath sounds normal. No respiratory distress. He has no wheezes. He has no rales.  Abdominal: Soft. Bowel sounds are normal. He exhibits no distension and no mass. There is no tenderness. There is no rebound and no guarding.  Genitourinary: Rectum normal and prostate normal. Rectal exam shows no external hemorrhoid, no internal hemorrhoid, no fissure, no mass, no tenderness and anal tone normal. Prostate is not enlarged (20gm) and not tender.  Musculoskeletal: Normal range of motion. He exhibits no edema.  No midline spine tenderness No paraspinous mm tenderness No SIJ tenderness bilaterallyl  Lymphadenopathy:    He has no cervical adenopathy.  Neurological: He is alert and oriented to person, place, and time.  CN grossly intact, station and gait intact Recall 3/3 Calculation 5/5 serial 3s  Skin: Skin is warm and dry. No rash noted.  Psychiatric: He has a normal mood and affect. His behavior is normal. Judgment and thought content normal.   Results for orders placed in visit on 02/04/14  FOLATE      Result Value Ref Range   Folate 20.8  >5.9 ng/mL      Assessment & Plan:   Problem List Items Addressed This Visit   Medicare annual wellness visit, subsequent - Primary     I have personally reviewed the Medicare Annual Wellness questionnaire and have noted 1. The patient's medical and social history 2. Their use of alcohol, tobacco or illicit drugs 3. Their current medications and supplements 4. The patient's functional ability including ADL's, fall risks, home safety risks and hearing or visual impairment. 5. Diet and physical activity 6. Evidence for depression or  mood disorders The patients weight, height, BMI have been recorded in the chart.  Hearing and vision has been addressed. I have made referrals, counseling and provided education to the patient based review of the above and I have provided the pt with a written personalized care plan for preventive services. Provider list updated - see scanned questionairre. Advanced directives discussed: pt has copy at home and will bring us a copy, wife is HCPOA  Reviewed preventative protocols and updated unless pt declined.    Lumbar strain     Exam benign today. PT would be ok if pt desires - will call me with name of therapist he desires to see.    HTN (hypertension)     Chronic, stable. Continue metoprolol and maxzide.    Relevant Medications      metoprolol tartrate (LOPRESSOR) 25 MG tablet   History of alcohol abuse     Decreased use.    Elevated PSA     Desires to have this followed today. Prior followed by Dr. Mena GoesEskridge. Will need PSA checked next labwork.    Relevant  Orders      PSA   COPD (chronic obstructive pulmonary disease)     Stable with decreased use of spiriva.         Follow up plan: Return in about 6 months (around 08/13/2014), or as needed, for follow up visit.

## 2014-02-10 NOTE — Addendum Note (Signed)
Addended by: Josph MachoANCE, KIMBERLY A on: 02/10/2014 11:30 AM   Modules accepted: Orders

## 2014-02-10 NOTE — Patient Instructions (Addendum)
Blood test for PSA today Bring me a copy of your living will to update your chart. Prevnar today (second pneumonia shot) Let us know contact information for physical therapy referral. Good to see you today, call us with questions. Return at your convenience for skin lesion removal.

## 2014-02-10 NOTE — Assessment & Plan Note (Signed)
I have personally reviewed the Medicare Annual Wellness questionnaire and have noted 1. The patient's medical and social history 2. Their use of alcohol, tobacco or illicit drugs 3. Their current medications and supplements 4. The patient's functional ability including ADL's, fall risks, home safety risks and hearing or visual impairment. 5. Diet and physical activity 6. Evidence for depression or mood disorders The patients weight, height, BMI have been recorded in the chart.  Hearing and vision has been addressed. I have made referrals, counseling and provided education to the patient based review of the above and I have provided the pt with a written personalized care plan for preventive services. Provider list updated - see scanned questionairre. Advanced directives discussed: pt has copy at home and will bring us a copy, wife is HCPOA  Reviewed preventative protocols and updated unless pt declined.

## 2014-02-10 NOTE — Assessment & Plan Note (Signed)
Chronic, stable. Continue metoprolol and maxzide.

## 2014-02-10 NOTE — Assessment & Plan Note (Signed)
Exam benign today. PT would be ok if pt desires - will call me with name of therapist he desires to see.

## 2014-02-10 NOTE — Progress Notes (Signed)
Pre visit review using our clinic review tool, if applicable. No additional management support is needed unless otherwise documented below in the visit note. 

## 2014-02-11 ENCOUNTER — Telehealth: Payer: Self-pay | Admitting: Family Medicine

## 2014-02-11 LAB — PSA: PSA: 2.99 ng/mL (ref 0.10–4.00)

## 2014-02-11 NOTE — Telephone Encounter (Signed)
Relevant patient education assigned to patient using Emmi. ° °

## 2014-02-21 ENCOUNTER — Encounter: Payer: Self-pay | Admitting: Family Medicine

## 2014-02-23 ENCOUNTER — Other Ambulatory Visit: Payer: Self-pay

## 2014-02-23 MED ORDER — TRIAMTERENE-HCTZ 37.5-25 MG PO TABS: 1.0000 | ORAL_TABLET | ORAL | Status: DC

## 2014-02-23 MED ORDER — METOPROLOL TARTRATE 25 MG PO TABS: 12.5000 mg | ORAL_TABLET | Freq: Two times a day (BID) | ORAL | Status: DC

## 2014-02-23 NOTE — Telephone Encounter (Signed)
Pt left v/m requesting refill metoprolol tartrate and triamterene HCTZ [facility] [address]. Advised pt done.

## 2014-02-24 ENCOUNTER — Encounter: Payer: Self-pay | Admitting: Family Medicine

## 2014-02-24 NOTE — Telephone Encounter (Signed)
plz schedule afternoon appt at patient's convenience. May need to be with another provider as I will be out of town beginning Friday.

## 2014-02-26 ENCOUNTER — Encounter: Payer: Self-pay | Admitting: Family Medicine

## 2014-02-26 ENCOUNTER — Ambulatory Visit (INDEPENDENT_AMBULATORY_CARE_PROVIDER_SITE_OTHER): Payer: Medicare Other | Admitting: Family Medicine

## 2014-02-26 VITALS — BP 122/70 | HR 76 | Temp 98.1°F | Wt 206.5 lb

## 2014-02-26 DIAGNOSIS — M539 Dorsopathy, unspecified: Secondary | ICD-10-CM

## 2014-02-26 DIAGNOSIS — M549 Dorsalgia, unspecified: Secondary | ICD-10-CM

## 2014-02-26 DIAGNOSIS — R109 Unspecified abdominal pain: Secondary | ICD-10-CM

## 2014-02-26 LAB — POCT URINALYSIS DIPSTICK
BILIRUBIN UA: NEGATIVE
Blood, UA: NEGATIVE
Glucose, UA: NEGATIVE
KETONES UA: NEGATIVE
Leukocytes, UA: NEGATIVE
Nitrite, UA: NEGATIVE
PH UA: 6.5
Protein, UA: NEGATIVE
SPEC GRAV UA: 1.015
Urobilinogen, UA: 0.2

## 2014-02-26 NOTE — Assessment & Plan Note (Addendum)
Actually pain inferior and lateral to left flank. Does have h/o mild renal insuff - will check renal US today. If normal, anticipate more of a MSK cause like external oblique strain (also given positional pain) and will refer to PT. Pt agrees with plan. UA today WNL.

## 2014-02-26 NOTE — Patient Instructions (Signed)
Let's check kidney ultrasound to ensure kidneys looking ok. If normal, I think you have posterior abdominal wall muscle (like external obliques) and will refer you to physical therapy - call me with name of therapist you'd like to see if needed.

## 2014-02-26 NOTE — Progress Notes (Signed)
Pre visit review using our clinic review tool, if applicable. No additional management support is needed unless otherwise documented below in the visit note. 

## 2014-02-26 NOTE — Progress Notes (Signed)
BP 122/70  Pulse 76  Temp(Src) 98.1 F (36.7 C) (Oral)  Wt 206 lb 8 oz (93.668 kg)   CC: flank pain  Subjective:    Patient ID: [name], male    DOB: 1933/01/24, 80 y.o.   MRN: [id number]  HPI: [name] is a 80 y.o. male presenting on 02/26/2014 for Back Pain   Ongoing left flank pain for last several weeks - wonders about kidney stone or infection. Painful to walk. Worse pain when he wakes up in am - sleeps on his abdomen to avoid CPAP use. No fevers/chills, dysuria, urgency or frequency, hematuria, radiculopathy down legs, bowel/bladder incontinence. Doesn't think this is muscular.   Lab Results  Component Value Date   CREATININE 1.2 02/03/2014    Relevant past medical, surgical, family and social history reviewed and updated as indicated.  Allergies and medications reviewed and updated. Current Outpatient Prescriptions on File Prior to Visit  Medication Sig  . aspirin 81 MG tablet Take 81 mg by mouth daily.  . cholecalciferol (VITAMIN D) 1000 UNITS tablet Take 1,000 Units by mouth daily.  . clonazePAM (KLONOPIN) 1 MG tablet Take 1 tablet (1 mg total) by mouth at bedtime as needed for anxiety.  . Coenzyme Q10 (COQ-10) 50 MG CAPS Take by mouth daily.  . Flaxseed, Linseed, (FLAX SEED OIL PO) Take 1,200 Units by mouth every other day.  . metoprolol tartrate (LOPRESSOR) 25 MG tablet Take 0.5 tablets (12.5 mg total) by mouth 2 (two) times daily.  . Milk Thistle 250 MG CAPS Take by mouth daily.  . Multiple Vitamin (MULTIVITAMIN) tablet Take 1 tablet by mouth daily.  . multivitamin-lutein (OCUVITE-LUTEIN) CAPS capsule Take 1 capsule by mouth daily.  . Omega-3 Fatty Acids (FISH OIL) 1000 MG CAPS Take 2,000 each by mouth daily.  . Potassium Aminobenzoate 500 MG TABS Take by mouth daily.  Marland Kitchen tiotropium (SPIRIVA) 18 MCG inhalation capsule Place 1 capsule (18 mcg total) into inhaler and inhale daily.  Marland Kitchen triamterene-hydrochlorothiazide (MAXZIDE-25) 37.5-25 MG per tablet Take 1  tablet by mouth every other day.  Marland Kitchen Zn-Pyg Afri-Nettle-Saw Palmet (SAW PALMETTO COMPLEX PO) Take 2,500 mg by mouth daily.   No current facility-administered medications on file prior to visit.    Review of Systems Per HPI unless specifically indicated above    Objective:    BP 122/70  Pulse 76  Temp(Src) 98.1 F (36.7 C) (Oral)  Wt 206 lb 8 oz (93.668 kg)  Physical Exam  Nursing note and vitals reviewed. Constitutional: He appears well-developed and well-nourished. No distress.  Musculoskeletal: He exhibits no edema.  Pain to palpation actually inferior and lateral to L flank just superior to PSIS. No pain midline spine No paraspinous mm tenderness Neg SLR bilaterally. No pain with int/ext rotation at hip. Neg FABER. No pain at SIJ or sciatic notch bilaterally. Tender GTB bilaterally  Skin: Skin is warm and dry. No rash noted.  Psychiatric: He has a normal mood and affect.   Results for orders placed in visit on 02/26/14  POCT URINALYSIS DIPSTICK      Result Value Ref Range   Color, UA Yellow     Clarity, UA Clear     Glucose, UA Negative     Bilirubin, UA Negative     Ketones, UA Negative     Spec Grav, UA 1.015     Blood, UA Negative     pH, UA 6.5     Protein, UA Negative  Urobilinogen, UA 0.2     Nitrite, UA Negative     Leukocytes, UA Negative        Assessment & Plan:   Problem List Items Addressed This Visit   Left flank pain - Primary     Actually pain inferior and lateral to left flank. Does have h/o mild renal insuff - will check renal US today. If normal, anticipate more of a MSK cause like external oblique strain (also given positional pain) and will refer to PT. Pt agrees with plan. UA today WNL.    Relevant Orders      US Renal    Other Visit Diagnoses   Left-sided back pain, unspecified location        Relevant Orders       POCT Urinalysis Dipstick (Completed)        Follow up plan: No Follow-up on file.

## 2014-03-10 ENCOUNTER — Ambulatory Visit
Admission: RE | Admit: 2014-03-10 | Discharge: 2014-03-10 | Disposition: A | Discharge status: Home / Self Care | Payer: Medicare Other | Source: Ambulatory Visit | Attending: Family Medicine | Admitting: Family Medicine

## 2014-03-10 DIAGNOSIS — R109 Unspecified abdominal pain: Secondary | ICD-10-CM

## 2014-03-11 ENCOUNTER — Encounter: Payer: Self-pay | Admitting: Family Medicine

## 2014-03-11 DIAGNOSIS — S39012A Strain of muscle, fascia and tendon of lower back, initial encounter: Secondary | ICD-10-CM

## 2014-03-11 DIAGNOSIS — R109 Unspecified abdominal pain: Secondary | ICD-10-CM

## 2014-03-11 NOTE — Telephone Encounter (Signed)
See My chart message

## 2014-03-12 NOTE — Addendum Note (Signed)
Addended by: Eustaquio BoydenGUTIERREZ, Manvir Prabhu on: 03/12/2014 08:12 AM   Modules accepted: Orders

## 2014-03-14 ENCOUNTER — Encounter: Payer: Self-pay | Admitting: Family Medicine

## 2014-03-14 DIAGNOSIS — H9113 Presbycusis, bilateral: Secondary | ICD-10-CM | POA: Insufficient documentation

## 2014-04-08 ENCOUNTER — Encounter: Payer: Self-pay | Admitting: Family Medicine

## 2014-04-08 ENCOUNTER — Other Ambulatory Visit: Payer: Self-pay | Admitting: Family Medicine

## 2014-04-08 MED ORDER — CLONAZEPAM 1 MG PO TABS: 1.0000 mg | ORAL_TABLET | Freq: Every evening | ORAL | Status: DC | PRN

## 2014-04-08 NOTE — Telephone Encounter (Signed)
Ok to refill 

## 2014-04-08 NOTE — Telephone Encounter (Signed)
plz phone in. 

## 2014-04-09 NOTE — Telephone Encounter (Signed)
Rx called in as directed.   

## 2014-07-07 ENCOUNTER — Other Ambulatory Visit: Payer: Self-pay | Admitting: Family Medicine

## 2014-07-07 NOTE — Telephone Encounter (Signed)
Ok to refill 

## 2014-07-07 NOTE — Telephone Encounter (Signed)
plz phone in. 

## 2014-07-08 NOTE — Telephone Encounter (Signed)
Rx called in as directed.   

## 2014-07-10 ENCOUNTER — Other Ambulatory Visit: Payer: Self-pay | Admitting: Family Medicine

## 2014-07-10 NOTE — Telephone Encounter (Signed)
Ok to refill 

## 2014-07-11 MED ORDER — CLONAZEPAM 1 MG PO TABS: 1.0000 mg | ORAL_TABLET | Freq: Every evening | ORAL | Status: DC | PRN

## 2014-07-11 NOTE — Telephone Encounter (Signed)
plz phone in if not already done. 

## 2014-07-12 ENCOUNTER — Other Ambulatory Visit: Payer: Self-pay | Admitting: Family Medicine

## 2014-07-13 ENCOUNTER — Telehealth: Payer: Self-pay | Admitting: Family Medicine

## 2014-07-13 NOTE — Telephone Encounter (Signed)
Pt called checking on the status of his klonopin rx

## 2014-07-13 NOTE — Telephone Encounter (Signed)
Patient notified that rx has already been called to the pharmacy. Verified this with the pharmacy before calling the patient.

## 2014-07-13 NOTE — Telephone Encounter (Signed)
Tried to call script in and was advised that she was already called in by [employer].

## 2014-07-18 ENCOUNTER — Encounter: Payer: Self-pay | Admitting: Family Medicine

## 2014-09-02 ENCOUNTER — Other Ambulatory Visit: Payer: Self-pay | Admitting: Family Medicine

## 2014-09-10 ENCOUNTER — Other Ambulatory Visit: Payer: Self-pay | Admitting: Family Medicine

## 2014-09-10 NOTE — Telephone Encounter (Signed)
plz phone in. 

## 2014-09-10 NOTE — Telephone Encounter (Signed)
Rx called in as directed.   

## 2014-09-15 ENCOUNTER — Other Ambulatory Visit: Payer: Self-pay | Admitting: Family Medicine

## 2014-09-15 NOTE — Telephone Encounter (Signed)
Called in 09/10/14.

## 2014-11-10 ENCOUNTER — Other Ambulatory Visit: Payer: Self-pay | Admitting: Family Medicine

## 2014-11-10 NOTE — Telephone Encounter (Signed)
Please advise refill.  Last OV 7.12.15.

## 2014-11-11 MED ORDER — METOPROLOL TARTRATE 25 MG PO TABS: 12.5000 mg | ORAL_TABLET | Freq: Two times a day (BID) | ORAL | Status: DC

## 2014-12-21 ENCOUNTER — Other Ambulatory Visit: Payer: Self-pay | Admitting: Family Medicine

## 2015-02-06 ENCOUNTER — Other Ambulatory Visit: Payer: Self-pay | Admitting: Family Medicine

## 2015-02-06 DIAGNOSIS — R972 Elevated prostate specific antigen [PSA]: Secondary | ICD-10-CM

## 2015-02-06 DIAGNOSIS — I10 Essential (primary) hypertension: Secondary | ICD-10-CM

## 2015-02-06 DIAGNOSIS — F1011 Alcohol abuse, in remission: Secondary | ICD-10-CM

## 2015-02-06 DIAGNOSIS — D7589 Other specified diseases of blood and blood-forming organs: Secondary | ICD-10-CM

## 2015-02-06 DIAGNOSIS — D539 Nutritional anemia, unspecified: Secondary | ICD-10-CM | POA: Insufficient documentation

## 2015-02-08 ENCOUNTER — Other Ambulatory Visit (INDEPENDENT_AMBULATORY_CARE_PROVIDER_SITE_OTHER): Payer: Medicare Other

## 2015-02-08 DIAGNOSIS — F1010 Alcohol abuse, uncomplicated: Secondary | ICD-10-CM | POA: Diagnosis not present

## 2015-02-08 DIAGNOSIS — R972 Elevated prostate specific antigen [PSA]: Secondary | ICD-10-CM

## 2015-02-08 DIAGNOSIS — D7589 Other specified diseases of blood and blood-forming organs: Secondary | ICD-10-CM | POA: Diagnosis not present

## 2015-02-08 DIAGNOSIS — I10 Essential (primary) hypertension: Secondary | ICD-10-CM

## 2015-02-08 DIAGNOSIS — F1011 Alcohol abuse, in remission: Secondary | ICD-10-CM

## 2015-02-08 LAB — COMPREHENSIVE METABOLIC PANEL
ALT: 20 U/L (ref 0–53)
AST: 20 U/L (ref 0–37)
Albumin: 3.8 g/dL (ref 3.5–5.2)
Alkaline Phosphatase: 65 U/L (ref 39–117)
BUN: 16 mg/dL (ref 6–23)
CO2: 31 mEq/L (ref 19–32)
Calcium: 9.6 mg/dL (ref 8.4–10.5)
Chloride: 103 mEq/L (ref 96–112)
Creatinine, Ser: 1.17 mg/dL (ref 0.40–1.50)
GFR: 63.79 mL/min (ref >60.00)
Glucose, Bld: 99 mg/dL (ref 70–99)
Potassium: 3.8 mEq/L (ref 3.5–5.1)
Sodium: 140 mEq/L (ref 135–145)
TOTAL PROTEIN: 6.7 g/dL (ref 6.0–8.3)
Total Bilirubin: 0.8 mg/dL (ref 0.2–1.2)

## 2015-02-08 LAB — LIPID PANEL
Cholesterol: 158 mg/dL (ref 0–200)
HDL: 67.3 mg/dL (ref >39.00)
LDL Cholesterol: 80 mg/dL (ref 0–99)
NonHDL: 90.7
Total CHOL/HDL Ratio: 2
Triglycerides: 55 mg/dL (ref 0.0–149.0)
VLDL: 11 mg/dL (ref 0.0–40.0)

## 2015-02-08 LAB — CBC WITH DIFFERENTIAL/PLATELET
BASOS ABS: 0 10*3/uL (ref 0.0–0.1)
Basophils Relative: 0.3 % (ref 0.0–3.0)
Eosinophils Absolute: 0.1 10*3/uL (ref 0.0–0.7)
Eosinophils Relative: 1.6 % (ref 0.0–5.0)
HCT: 41.3 % (ref 39.0–52.0)
Hemoglobin: 13.9 g/dL (ref 13.0–17.0)
LYMPHS ABS: 2.4 10*3/uL (ref 0.7–4.0)
Lymphocytes Relative: 33.9 % (ref 12.0–46.0)
MCHC: 33.6 g/dL (ref 30.0–36.0)
MCV: 107.3 fl — ABNORMAL HIGH (ref 78.0–100.0)
MONO ABS: 0.7 10*3/uL (ref 0.1–1.0)
Monocytes Relative: 10.2 % (ref 3.0–12.0)
Neutro Abs: 3.8 10*3/uL (ref 1.4–7.7)
Neutrophils Relative %: 54 % (ref 43.0–77.0)
Platelets: 176 10*3/uL (ref 150.0–400.0)
RBC: 3.85 Mil/uL — ABNORMAL LOW (ref 4.22–5.81)
RDW: 14.1 % (ref 11.5–15.5)
WBC: 7 10*3/uL (ref 4.0–10.5)

## 2015-02-08 LAB — PSA: PSA: 1.26 ng/mL (ref 0.10–4.00)

## 2015-02-09 LAB — PATHOLOGIST SMEAR REVIEW

## 2015-02-12 ENCOUNTER — Ambulatory Visit (INDEPENDENT_AMBULATORY_CARE_PROVIDER_SITE_OTHER): Payer: Medicare Other | Admitting: Family Medicine

## 2015-02-12 ENCOUNTER — Encounter: Payer: Self-pay | Admitting: Family Medicine

## 2015-02-12 VITALS — BP 124/66 | HR 68 | Temp 97.6°F | Ht 65.75 in | Wt 201.5 lb

## 2015-02-12 DIAGNOSIS — D7589 Other specified diseases of blood and blood-forming organs: Secondary | ICD-10-CM

## 2015-02-12 DIAGNOSIS — I10 Essential (primary) hypertension: Secondary | ICD-10-CM

## 2015-02-12 DIAGNOSIS — G4700 Insomnia, unspecified: Secondary | ICD-10-CM | POA: Diagnosis not present

## 2015-02-12 DIAGNOSIS — F10288 Alcohol dependence with other alcohol-induced disorder: Secondary | ICD-10-CM

## 2015-02-12 MED ORDER — CLONAZEPAM 1 MG PO TABS: 1.0000 mg | ORAL_TABLET | Freq: Every evening | ORAL | Status: DC | PRN

## 2015-02-12 MED ORDER — VITAMIN B-1 100 MG PO TABS: 100.0000 mg | ORAL_TABLET | Freq: Every day | ORAL | Status: DC

## 2015-02-12 MED ORDER — METOPROLOL TARTRATE 25 MG PO TABS: 12.5000 mg | ORAL_TABLET | Freq: Two times a day (BID) | ORAL | Status: DC

## 2015-02-12 MED ORDER — TRIAMTERENE-HCTZ 37.5-25 MG PO TABS: 1.0000 | ORAL_TABLET | ORAL | Status: DC

## 2015-02-12 MED ORDER — NALTREXONE HCL 50 MG PO TABS: 50.0000 mg | ORAL_TABLET | Freq: Every day | ORAL | Status: DC

## 2015-02-12 NOTE — Patient Instructions (Addendum)
Start Thiamine vitamin B1.  Start naltrexone (prescription provided today) We will give you contact # for alcohol use counselor to call and make an appointment.  Stop alcohol all together.  Continue klonopin nightly but just 1 tablet.  Return to see me in 3-4 weeks for follow up and for medicare wellness visit.

## 2015-02-12 NOTE — Progress Notes (Signed)
Pre visit review using our clinic review tool, if applicable. No additional management support is needed unless otherwise documented below in the visit note. 

## 2015-02-12 NOTE — Progress Notes (Signed)
BP 124/66 mmHg  Pulse 68  Temp(Src) 97.6 F (36.4 C) (Oral)  Ht 5' 5.75" (1.67 m)  Wt 201 lb 8 oz (91.4 kg)  BMI 32.77 kg/m2   CC: medicare wellness visit -->converted to acute visit to discuss alcohol concerns Subjective:    Patient ID: [name], male    DOB: February 14, 1933, 81 y.o.   MRN: [id number]  HPI: ARMEND HOCHSTATTER is a 81 y.o. male presenting on 02/12/2015 for Annual Exam   Sees orthopedist Dr Delynn Flavin regularly.   Wife presents today with several concerns. Mainly wants to talk about patient's alcohol use. Pt endorses using alcohol as an escape tool. Wife with multiple surgeries in past year. This has caused marital stress.   Alcohol abuse - ongoing trouble for years. Wife states he's been an alcoholic for years. Has had trouble with withdrawal years ago while hospitalized. H/o DTs.   States he has not drank any alcohol in 5 days. Normally drinks 3-4 drinks/day mainly liquor (vodka).   Sertraline prior trial was too sedating. Unsure if he tried naltrexone.  Vision screen with eye doctor. Wears hearing aides.  Denies falls  Positive depression screen. PHQ9 = 9. Extremely difficult to function.   Preventative: COLONOSCOPY Date: 07/2013 no polyps [name] Prostate - elevated PSA in past with normal biopsy per patient - this was followed by Dr. Mena Goes in past Q6 mo active surveillance. Has not returned since 01/2013. Would like to follow up here for now.  Lab Results  Component Value Date   PSA 1.26 02/08/2015   PSA 2.99 02/10/2014  Flu shot - yearly Tetanus 2010  Pneumovax 07/2002. prevnar: 01/2014 Tdap 2012 zostavax 2008 Advanced directives: has this at home, will bring copy. Wife [name] is HCPOA.  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 Diet: good water, fruits/vegetables daily. Significant sweet  tea.  Relevant past medical, surgical, family and social history reviewed and updated as indicated. Interim medical history since our last visit reviewed. Allergies and medications reviewed and updated. Current Outpatient Prescriptions on File Prior to Visit  Medication Sig  . aspirin 81 MG tablet Take 81 mg by mouth daily.  . Coenzyme Q10 (COQ-10) 50 MG CAPS Take by mouth daily.  . Flaxseed, Linseed, (FLAX SEED OIL PO) Take 1,200 Units by mouth every other day.  . Milk Thistle 250 MG CAPS Take by mouth daily.  . Multiple Vitamin (MULTIVITAMIN) tablet Take 1 tablet by mouth daily.  . multivitamin-lutein (OCUVITE-LUTEIN) CAPS capsule Take 1 capsule by mouth daily.  . Omega-3 Fatty Acids (FISH OIL) 1000 MG CAPS Take 2,000 each by mouth daily.  . Potassium Aminobenzoate 500 MG TABS Take by mouth daily.  Marland Kitchen Zn-Pyg Afri-Nettle-Saw Palmet (SAW PALMETTO COMPLEX PO) Take 2,500 mg by mouth daily.  . cholecalciferol (VITAMIN D) 1000 UNITS tablet Take 1,000 Units by mouth daily.   No current facility-administered medications on file prior to visit.    Review of Systems Per HPI unless specifically indicated above     Objective:    BP 124/66 mmHg  Pulse 68  Temp(Src) 97.6 F (36.4 C) (Oral)  Ht 5' 5.75" (1.67 m)  Wt 201 lb 8 oz (91.4 kg)  BMI 32.77 kg/m2  Wt Readings from Last 3 Encounters:  02/12/15 201 lb 8 oz (91.4 kg)  02/26/14 206 lb 8 oz (93.668 kg)  02/10/14 204 lb 8 oz (92.761 kg)    Physical Exam  Constitutional: He appears well-developed and well-nourished. No distress.  Psychiatric: He has a normal mood and affect.  Nursing note and vitals reviewed.  Results for orders placed or performed in visit on 02/08/15  Comprehensive metabolic panel  Result Value Ref Range   Sodium 140 135 - 145 mEq/L   Potassium 3.8 3.5 - 5.1 mEq/L   Chloride 103 96 - 112 mEq/L   CO2 31 19 - 32 mEq/L   Glucose, Bld 99 70 - 99 mg/dL   BUN 16 6 - 23 mg/dL   Creatinine, Ser 1.611.17 0.40 - 1.50 mg/dL    Total Bilirubin 0.8 0.2 - 1.2 mg/dL   Alkaline Phosphatase 65 39 - 117 U/L   AST 20 0 - 37 U/L   ALT 20 0 - 53 U/L   Total Protein 6.7 6.0 - 8.3 g/dL   Albumin 3.8 3.5 - 5.2 g/dL   Calcium 9.6 8.4 - 09.610.5 mg/dL   GFR 04.5463.79 >09.81>60.00 mL/min  Lipid panel  Result Value Ref Range   Cholesterol 158 0 - 200 mg/dL   Triglycerides 19.155.0 0.0 - 149.0 mg/dL   HDL 47.8267.30 >95.62>39.00 mg/dL   VLDL 13.011.0 0.0 - 86.540.0 mg/dL   LDL Cholesterol 80 0 - 99 mg/dL   Total CHOL/HDL Ratio 2    NonHDL 90.70   CBC with Differential/Platelet  Result Value Ref Range   WBC 7.0 4.0 - 10.5 K/uL   RBC 3.85 (L) 4.22 - 5.81 Mil/uL   Hemoglobin 13.9 13.0 - 17.0 g/dL   HCT 78.441.3 69.639.0 - 29.552.0 %   MCV 107.3 (H) 78.0 - 100.0 fl   MCHC 33.6 30.0 - 36.0 g/dL   RDW 28.414.1 13.211.5 - 44.015.5 %   Platelets 176.0 150.0 - 400.0 K/uL   Neutrophils Relative % 54.0 43.0 - 77.0 %   Lymphocytes Relative 33.9 12.0 - 46.0 %   Monocytes Relative 10.2 3.0 - 12.0 %   Eosinophils Relative 1.6 0.0 - 5.0 %   Basophils Relative 0.3 0.0 - 3.0 %   Neutro Abs 3.8 1.4 - 7.7 K/uL   Lymphs Abs 2.4 0.7 - 4.0 K/uL   Monocytes Absolute 0.7 0.1 - 1.0 K/uL   Eosinophils Absolute 0.1 0.0 - 0.7 K/uL   Basophils Absolute 0.0 0.0 - 0.1 K/uL  Pathologist smear review  Result Value Ref Range   Path Review SEE NOTE   PSA  Result Value Ref Range   PSA 1.26 0.10 - 4.00 ng/mL      Assessment & Plan:   Problem List Items Addressed This Visit    Alcohol dependence - Primary    Concern for alcohol abuse in setting of marital stress.  Pt also expresses concern and is interested in medication to help quit.  Discussed options - will trial naltrexone at 50mg  daily. Also discussed importance of establishing with substance abuse counselor.  Contact info for [facility] provided today. Pt states he will call to schedule an appointment. Reviewed labwork - discussed noted macrocytosis and relation to alcohol use. Will need vitamin labwork at next visit. Also discussed  detrimental health effects of continued alcohol use. Over 25 minutes were spent face-to-face with the patient during this encounter and >50% of that time was spent on counseling and coordination of care      HTN (hypertension)    Rx refilled      Relevant Medications   metoprolol tartrate (LOPRESSOR) 25 MG tablet  triamterene-hydrochlorothiazide (MAXZIDE-25) 37.5-25 MG per tablet   Insomnia    Pt has been taking klonopin 2mg  nightly. Discussed concern with mixing benzo with alcohol. rec decrease klonopin to 1 tab nightly, and complete cessation of alcohol.  Pt endorses no alcohol in last 5 days.      Macrocytosis without anemia    Anticipate alcohol related. Check vitamin levels next labwork.          Follow up plan: Return in about 4 weeks (around 03/12/2015), or as needed, for follow up visit.

## 2015-02-13 NOTE — Assessment & Plan Note (Signed)
Pt has been taking klonopin  nightly. Discussed concern with mixing benzo with alcohol. rec decrease klonopin to 1 tab nightly, and complete cessation of alcohol.  Pt endorses no alcohol in last 5 days.

## 2015-02-13 NOTE — Assessment & Plan Note (Signed)
Rx refilled.

## 2015-02-13 NOTE — Assessment & Plan Note (Signed)
Concern for alcohol abuse in setting of marital stress.  Pt also expresses concern and is interested in medication to help quit.  Discussed options - will trial naltrexone at  daily. Also discussed importance of establishing with substance abuse counselor.  Contact info for [facility] provided today. Pt states he will call to schedule an appointment. Reviewed labwork - discussed noted macrocytosis and relation to alcohol use. Will need vitamin labwork at next visit. Also discussed detrimental health effects of continued alcohol use. Over 25 minutes were spent face-to-face with the patient during this encounter and >50% of that time was spent on counseling and coordination of care

## 2015-02-13 NOTE — Assessment & Plan Note (Signed)
Anticipate alcohol related. Check vitamin levels next labwork.

## 2015-02-14 ENCOUNTER — Encounter: Payer: Self-pay | Admitting: Family Medicine

## 2015-03-11 ENCOUNTER — Encounter: Payer: Self-pay | Admitting: Family Medicine

## 2015-03-11 ENCOUNTER — Ambulatory Visit (INDEPENDENT_AMBULATORY_CARE_PROVIDER_SITE_OTHER): Payer: Medicare Other | Admitting: Family Medicine

## 2015-03-11 VITALS — BP 118/66 | HR 72 | Temp 97.6°F | Wt 203.5 lb

## 2015-03-11 DIAGNOSIS — Z7189 Other specified counseling: Secondary | ICD-10-CM | POA: Insufficient documentation

## 2015-03-11 DIAGNOSIS — D7589 Other specified diseases of blood and blood-forming organs: Secondary | ICD-10-CM

## 2015-03-11 DIAGNOSIS — Z0000 Encounter for general adult medical examination without abnormal findings: Secondary | ICD-10-CM

## 2015-03-11 DIAGNOSIS — F32A Depression, unspecified: Secondary | ICD-10-CM

## 2015-03-11 DIAGNOSIS — F10288 Alcohol dependence with other alcohol-induced disorder: Secondary | ICD-10-CM

## 2015-03-11 DIAGNOSIS — F329 Major depressive disorder, single episode, unspecified: Secondary | ICD-10-CM

## 2015-03-11 DIAGNOSIS — R972 Elevated prostate specific antigen [PSA]: Secondary | ICD-10-CM

## 2015-03-11 NOTE — Assessment & Plan Note (Signed)
Advised to bring [place] copy to update chart.

## 2015-03-11 NOTE — Progress Notes (Signed)
Pre visit review using our clinic review tool, if applicable. No additional management support is needed unless otherwise documented below in the visit note. 

## 2015-03-11 NOTE — Assessment & Plan Note (Signed)
Endorses abstinence. Will continue to monitor this. Aware of health risks of continued drinking.

## 2015-03-11 NOTE — Assessment & Plan Note (Signed)

## 2015-03-11 NOTE — Assessment & Plan Note (Signed)
Improved PHQ9 today. Sertraline previously too sedating.

## 2015-03-11 NOTE — Assessment & Plan Note (Signed)
Preventative protocols reviewed and updated unless pt declined. Discussed healthy diet and lifestyle.  

## 2015-03-11 NOTE — Assessment & Plan Note (Signed)
Likely EtOH use related - should see imporvement. Recheck in 4 mo.

## 2015-03-11 NOTE — Patient Instructions (Addendum)
Good to see you today.  I'm glad you have stopped alcohol. Continue klonopin  nightly but only take if needed. Continue thiamine.  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  Return as needed or in 4 months for follow up  Bring me copy of living will.
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
## 2015-03-11 NOTE — Assessment & Plan Note (Signed)
Normal PSA, declined DRE today.

## 2015-03-17 ENCOUNTER — Encounter: Payer: Self-pay | Admitting: Family Medicine

## 2015-03-17 ENCOUNTER — Encounter: Payer: Self-pay | Admitting: *Deleted

## 2015-03-29 ENCOUNTER — Encounter: Payer: Self-pay | Admitting: Family Medicine

## 2015-05-02 ENCOUNTER — Other Ambulatory Visit: Payer: Self-pay | Admitting: Family Medicine

## 2015-05-03 MED ORDER — TRIAMTERENE-HCTZ 37.5-25 MG PO TABS: 1.0000 | ORAL_TABLET | ORAL | Status: DC

## 2015-05-03 MED ORDER — CLONAZEPAM 1 MG PO TABS: 1.0000 mg | ORAL_TABLET | Freq: Every evening | ORAL | Status: DC | PRN

## 2015-05-03 NOTE — Telephone Encounter (Signed)
Ok to refill 

## 2015-05-03 NOTE — Telephone Encounter (Signed)
Rx called in as directed.   

## 2015-05-03 NOTE — Telephone Encounter (Signed)
plz phone in. 

## 2015-05-12 ENCOUNTER — Ambulatory Visit (INDEPENDENT_AMBULATORY_CARE_PROVIDER_SITE_OTHER): Payer: Medicare Other | Admitting: Family Medicine

## 2015-05-12 ENCOUNTER — Encounter: Payer: Self-pay | Admitting: Family Medicine

## 2015-05-12 VITALS — BP 132/76 | HR 72 | Temp 97.5°F | Wt 202.2 lb

## 2015-05-12 DIAGNOSIS — R894 Abnormal immunological findings in specimens from other organs, systems and tissues: Secondary | ICD-10-CM | POA: Insufficient documentation

## 2015-05-12 DIAGNOSIS — F10288 Alcohol dependence with other alcohol-induced disorder: Secondary | ICD-10-CM | POA: Diagnosis not present

## 2015-05-12 NOTE — Progress Notes (Signed)
BP 132/76 mmHg  Pulse 72  Temp(Src) 97.5 F (36.4 C) (Oral)  Wt 202 lb 4 oz (91.74 kg)   CC: abnormal labs  Subjective:    Patient ID: [name], male    DOB: 1933/03/19, 81 y.o.   MRN: [id number]  HPI: [name] is a 81 y.o. 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 Denies h/o HSV 1 or 2 outbreak. Rest of STDs tested were normal (including Hepatitis panel and HIV test).  No UTI sxs. No new rashes. No h/o blistering rash other than shingles 60yrs ago. Current monogamous relationship.   Monogamous relationship with wife for last 11 yrs. Prior 69yr marriage. Between these two periods - multiple sexual partners with unprotected sex.   Has not discussed results with wife, planning on discussing this afternoon.  Alcohol use/abuse - never took naltrexone, did not f/u with [facility]. He has been taking thiamine. Weaning off klonopin to  nightly. Previously admitted to over drinking. In last month he has had 1 glass of wine for his birthday this past weekend. No more liquor.   Relevant past medical, surgical, family and social history reviewed and updated as indicated. Interim medical history since our last visit reviewed. Allergies and medications reviewed and updated. Current Outpatient Prescriptions on File Prior to Visit  Medication Sig  . aspirin 81 MG tablet Take 81 mg by mouth daily.  . cholecalciferol (VITAMIN D) 1000 UNITS tablet Take 1,000 Units by mouth daily.  . clonazePAM (KLONOPIN) 1 MG tablet Take 1 tablet (1 mg total) by mouth at bedtime as needed.  . Coenzyme Q10 (COQ-10) 50 MG CAPS Take by mouth daily.  . Flaxseed, Linseed, (FLAX SEED OIL PO) Take 1,200 Units by mouth every other day.  . metoprolol tartrate (LOPRESSOR) 25 MG tablet Take 0.5 tablets (12.5 mg total) by mouth 2 (two) times daily.  . Milk Thistle 250 MG CAPS Take by mouth daily.    . Multiple Vitamin (MULTIVITAMIN) tablet Take 1 tablet by mouth daily.  . multivitamin-lutein (OCUVITE-LUTEIN) CAPS capsule Take 1 capsule by mouth daily.  . Omega-3 Fatty Acids (FISH OIL) 1000 MG CAPS Take 2,000 each by mouth daily.  . Potassium Aminobenzoate 500 MG TABS Take by mouth daily.  Marland Kitchen thiamine (VITAMIN B-1) 100 MG tablet Take 1 tablet (100 mg total) by mouth daily.  Marland Kitchen triamterene-hydrochlorothiazide (MAXZIDE-25) 37.5-25 MG tablet Take 1 tablet by mouth every other day.  Marland Kitchen Zn-Pyg Afri-Nettle-Saw Palmet (SAW PALMETTO COMPLEX PO) Take 2,500 mg by mouth every other day.    No current facility-administered medications on file prior to visit.    Review of Systems Per HPI unless specifically indicated above     Objective:    BP 132/76 mmHg  Pulse 72  Temp(Src) 97.5 F (36.4 C) (Oral)  Wt 202 lb 4 oz (91.74 kg)  Wt Readings from Last 3 Encounters:  05/12/15 202 lb 4 oz (91.74 kg)  03/11/15 203 lb 8 oz (92.307 kg)  02/12/15 201 lb 8 oz (91.4 kg)    Physical Exam  Constitutional: He appears well-developed and well-nourished. No distress.  Psychiatric: He has a normal mood and affect.  Nursing note and vitals reviewed.     Assessment & Plan:  Over 25 minutes were spent face-to-face with the patient during this encounter and >50% of that time was spent on counseling and coordination  of care  Problem List Items Addressed This Visit    Positive test for herpes simplex virus (HSV) antibody - Primary    Discussed implications of positive HSV1/2 IgG Ab test with patient - indicative of prior exposure and infection. Discussed possible false positives, but given h/o previous risky sexual activity anticipate true positive.  As no h/o primary outbreak, ?asxs carrier state - but discussed risk of transmission even without active outbreak. Discussed condom use. Discussed anticipate low risk of acute outbreak developing if he and wife have never had any issues over last 11 yrs. Discussed  relation of stress with herpes outbreaks. Encouraged he discuss this with wife - he states he is planning on broaching issue with wife this afternoon.      Alcohol dependence (HCC)    Endorses continued abstinence.          Follow up plan: No Follow-up on file.

## 2015-05-12 NOTE — Assessment & Plan Note (Signed)
Endorses continued abstinence.

## 2015-05-12 NOTE — Progress Notes (Signed)
Pre visit review using our clinic review tool, if applicable. No additional management support is needed unless otherwise documented below in the visit note. 

## 2015-05-12 NOTE — Assessment & Plan Note (Addendum)
Discussed implications of positive HSV1/2 IgG Ab test with patient - indicative of prior exposure and infection. Discussed possible false positives, but given h/o previous risky sexual activity anticipate true positive.  As no h/o primary outbreak, ?asxs carrier state - but discussed risk of transmission even without active outbreak. Discussed condom use. Discussed anticipate low risk of acute outbreak developing if he and wife have never had any issues over last 11 yrs. Discussed relation of stress with herpes outbreaks. Encouraged he discuss this with wife - he states he is planning on broaching issue with wife this afternoon.

## 2015-05-12 NOTE — Patient Instructions (Addendum)
I think you are ok. Possible previous exposure but I think low likelihood of outbreak developing.  Watch for new outbreak symptoms such as tingling or painful blistering rash.

## 2015-05-13 ENCOUNTER — Encounter: Payer: Self-pay | Admitting: Family Medicine

## 2015-06-11 ENCOUNTER — Other Ambulatory Visit: Payer: Self-pay | Admitting: Family Medicine

## 2015-06-11 NOTE — Telephone Encounter (Signed)
plz phone in. 

## 2015-06-11 NOTE — Telephone Encounter (Signed)
Received a refill request for Klonopin. Last refilled 05/03/15 for #30 with 0 refills. Last office visit 05/12/15. Ok to refill this medication?

## 2015-06-14 NOTE — Telephone Encounter (Signed)
Rx called in as directed.   

## 2015-06-21 ENCOUNTER — Encounter: Payer: Self-pay | Admitting: Family Medicine

## 2015-06-22 ENCOUNTER — Other Ambulatory Visit: Payer: Self-pay

## 2015-06-22 MED ORDER — CLONAZEPAM 1 MG PO TABS: 1.0000 mg | ORAL_TABLET | Freq: Every evening | ORAL | Status: DC | PRN

## 2015-06-22 NOTE — Telephone Encounter (Signed)
Pt left v/m requesting 90 day refill on clonazepam to [facility] [address]; pt [place] and [place]b 2017. Pt request cb.last refilled # 30 on 06/11/15. Last annual exam on 03/11/15. Pt request cb.

## 2015-06-22 NOTE — Telephone Encounter (Signed)
Please schedule a medicare wellness visit for patient after 03/10/16. Thanks!

## 2015-06-22 NOTE — Telephone Encounter (Signed)
Plz schedule for patient.

## 2015-06-22 NOTE — Telephone Encounter (Signed)
Plz phone in 90d supply to pharmacy - one time amt due to travel. Fill on date 07/10/2015

## 2015-06-23 NOTE — Telephone Encounter (Signed)
Rx called in as directed.   

## 2015-09-13 DIAGNOSIS — M7061 Trochanteric bursitis, right hip: Secondary | ICD-10-CM | POA: Diagnosis not present

## 2015-10-16 ENCOUNTER — Other Ambulatory Visit: Payer: Self-pay | Admitting: Family Medicine

## 2015-10-16 NOTE — Telephone Encounter (Signed)
Last office visit 05/12/2015.  Last refilled 06/22/2015 for #90 with no refills.  Ok to refill?

## 2015-10-18 MED ORDER — CLONAZEPAM 1 MG PO TABS: 1.0000 mg | ORAL_TABLET | Freq: Every evening | ORAL | Status: DC | PRN

## 2015-10-18 NOTE — Telephone Encounter (Signed)
Please call in.  Thanks.   

## 2015-10-19 NOTE — Telephone Encounter (Signed)
Rx called in as directed.   

## 2015-11-03 DIAGNOSIS — F4323 Adjustment disorder with mixed anxiety and depressed mood: Secondary | ICD-10-CM | POA: Diagnosis not present

## 2015-11-17 DIAGNOSIS — H04202 Unspecified epiphora, left lacrimal gland: Secondary | ICD-10-CM | POA: Diagnosis not present

## 2015-11-17 DIAGNOSIS — H0289 Other specified disorders of eyelid: Secondary | ICD-10-CM | POA: Diagnosis not present

## 2015-11-24 DIAGNOSIS — F4323 Adjustment disorder with mixed anxiety and depressed mood: Secondary | ICD-10-CM | POA: Diagnosis not present

## 2015-12-13 DIAGNOSIS — H6121 Impacted cerumen, right ear: Secondary | ICD-10-CM | POA: Diagnosis not present

## 2016-01-20 ENCOUNTER — Encounter: Payer: Self-pay | Admitting: Family Medicine

## 2016-01-20 NOTE — Telephone Encounter (Signed)
Please see Mychart message from patient.  

## 2016-01-25 MED ORDER — CLONAZEPAM 0.5 MG PO TABS: 0.5000 mg | ORAL_TABLET | Freq: Two times a day (BID) | ORAL | Status: DC | PRN

## 2016-01-25 NOTE — Telephone Encounter (Signed)
plz phone in new klonopin Rx

## 2016-01-25 NOTE — Telephone Encounter (Signed)
Rx called in as prescribed 

## 2016-02-09 ENCOUNTER — Other Ambulatory Visit: Payer: Self-pay | Admitting: Family Medicine

## 2016-02-09 ENCOUNTER — Other Ambulatory Visit (INDEPENDENT_AMBULATORY_CARE_PROVIDER_SITE_OTHER): Payer: Medicare Other

## 2016-02-09 DIAGNOSIS — I10 Essential (primary) hypertension: Secondary | ICD-10-CM

## 2016-02-09 DIAGNOSIS — R972 Elevated prostate specific antigen [PSA]: Secondary | ICD-10-CM

## 2016-02-09 DIAGNOSIS — F10288 Alcohol dependence with other alcohol-induced disorder: Secondary | ICD-10-CM

## 2016-02-09 DIAGNOSIS — D7589 Other specified diseases of blood and blood-forming organs: Secondary | ICD-10-CM | POA: Diagnosis not present

## 2016-02-09 LAB — CBC WITH DIFFERENTIAL/PLATELET
BASOS PCT: 0.5 % (ref 0.0–3.0)
Basophils Absolute: 0 10*3/uL (ref 0.0–0.1)
EOS PCT: 4.1 % (ref 0.0–5.0)
Eosinophils Absolute: 0.2 10*3/uL (ref 0.0–0.7)
HCT: 38.1 % — ABNORMAL LOW (ref 39.0–52.0)
Hemoglobin: 12.8 g/dL — ABNORMAL LOW (ref 13.0–17.0)
LYMPHS ABS: 2.2 10*3/uL (ref 0.7–4.0)
Lymphocytes Relative: 37.5 % (ref 12.0–46.0)
MCHC: 33.6 g/dL (ref 30.0–36.0)
MCV: 103 fl — ABNORMAL HIGH (ref 78.0–100.0)
Monocytes Absolute: 0.7 10*3/uL (ref 0.1–1.0)
Monocytes Relative: 11.7 % (ref 3.0–12.0)
NEUTROS PCT: 46.2 % (ref 43.0–77.0)
Neutro Abs: 2.7 10*3/uL (ref 1.4–7.7)
PLATELETS: 190 10*3/uL (ref 150.0–400.0)
RBC: 3.69 Mil/uL — AB (ref 4.22–5.81)
RDW: 14.5 % (ref 11.5–15.5)
WBC: 5.8 10*3/uL (ref 4.0–10.5)

## 2016-02-09 LAB — LIPID PANEL
Cholesterol: 153 mg/dL (ref 0–200)
HDL: 56.9 mg/dL (ref >39.00)
LDL Cholesterol: 86 mg/dL (ref 0–99)
NonHDL: 95.72
Total CHOL/HDL Ratio: 3
Triglycerides: 47 mg/dL (ref 0.0–149.0)
VLDL: 9.4 mg/dL (ref 0.0–40.0)

## 2016-02-09 LAB — COMPREHENSIVE METABOLIC PANEL
ALK PHOS: 52 U/L (ref 39–117)
ALT: 12 U/L (ref 0–53)
AST: 17 U/L (ref 0–37)
Albumin: 3.8 g/dL (ref 3.5–5.2)
BUN: 14 mg/dL (ref 6–23)
CHLORIDE: 111 meq/L (ref 96–112)
CO2: 28 mEq/L (ref 19–32)
Calcium: 9.3 mg/dL (ref 8.4–10.5)
Creatinine, Ser: 1.08 mg/dL (ref 0.40–1.50)
GFR: 69.79 mL/min (ref >60.00)
Glucose, Bld: 109 mg/dL — ABNORMAL HIGH (ref 70–99)
Potassium: 4.6 mEq/L (ref 3.5–5.1)
Sodium: 144 mEq/L (ref 135–145)
Total Bilirubin: 0.4 mg/dL (ref 0.2–1.2)
Total Protein: 6.1 g/dL (ref 6.0–8.3)

## 2016-02-09 LAB — IBC PANEL
Iron: 120 ug/dL (ref 42–165)
Saturation Ratios: 42.2 % (ref 20.0–50.0)
Transferrin: 203 mg/dL — ABNORMAL LOW (ref 212.0–360.0)

## 2016-02-09 LAB — VITAMIN B12: VITAMIN B 12: 594 pg/mL (ref 211–911)

## 2016-02-09 LAB — SEDIMENTATION RATE: Sed Rate: 11 mm/hr (ref 0–20)

## 2016-02-09 LAB — PSA: PSA: 1.09 ng/mL (ref 0.10–4.00)

## 2016-02-09 LAB — FERRITIN: Ferritin: 161.6 ng/mL (ref 22.0–322.0)

## 2016-02-15 ENCOUNTER — Ambulatory Visit (INDEPENDENT_AMBULATORY_CARE_PROVIDER_SITE_OTHER): Payer: Medicare Other

## 2016-02-15 ENCOUNTER — Ambulatory Visit (INDEPENDENT_AMBULATORY_CARE_PROVIDER_SITE_OTHER): Payer: Medicare Other | Admitting: Family Medicine

## 2016-02-15 ENCOUNTER — Encounter: Payer: Self-pay | Admitting: Family Medicine

## 2016-02-15 VITALS — BP 110/60 | HR 65 | Temp 98.6°F | Ht 65.5 in | Wt 197.0 lb

## 2016-02-15 DIAGNOSIS — Z0000 Encounter for general adult medical examination without abnormal findings: Secondary | ICD-10-CM | POA: Diagnosis not present

## 2016-02-15 DIAGNOSIS — R972 Elevated prostate specific antigen [PSA]: Secondary | ICD-10-CM | POA: Diagnosis not present

## 2016-02-15 DIAGNOSIS — F10288 Alcohol dependence with other alcohol-induced disorder: Secondary | ICD-10-CM

## 2016-02-15 DIAGNOSIS — H9113 Presbycusis, bilateral: Secondary | ICD-10-CM

## 2016-02-15 DIAGNOSIS — Z7189 Other specified counseling: Secondary | ICD-10-CM | POA: Diagnosis not present

## 2016-02-15 DIAGNOSIS — G4700 Insomnia, unspecified: Secondary | ICD-10-CM

## 2016-02-15 DIAGNOSIS — I10 Essential (primary) hypertension: Secondary | ICD-10-CM

## 2016-02-15 DIAGNOSIS — D7589 Other specified diseases of blood and blood-forming organs: Secondary | ICD-10-CM

## 2016-02-15 MED ORDER — SILDENAFIL CITRATE 100 MG PO TABS: 50.0000 mg | ORAL_TABLET | Freq: Every day | ORAL | Status: DC | PRN

## 2016-02-15 NOTE — Progress Notes (Signed)
BP 110/60 mmHg  Pulse 65  Temp(Src) 98.6 F (37 C) (Oral)  Ht 5' 5.5" (1.664 m)  Wt 197 lb (89.359 kg)  BMI 32.27 kg/m2  SpO2 94%   CC: medicare wellness visit CPE  Subjective:    Patient ID: [name], male    DOB: Mar 27, 1933, 82 y.o.   MRN: [id number]  HPI: [name] is a 82 y.o. male presenting on 02/15/2016 for Annual Exam   Saw Virl Axe today for medicare wellness visit. Note reviewed.  Remains preoccupied with taking care of [name]. She has multiple medical problems. Has started traveling as well - recent cruise to [place], [place], [place].   ED - trouble maintaining erection. Denies chest pain, tightness, dyspnea.   Eye exam 10/2015.  Dental exam regularly - now has partial dentures.   Alcohol - has decreased drinking to 1 glass wine 3-4 times/month.  Started working as Agricultural consultant at [employer]   Preventative: COLONOSCOPY Date: 07/2013 no polyps [name]  Prostate - elevated PSA in past with normal biopsy per patient - this was followed by Dr. Mena Goes in past Q6 mo active surveillance. Has not returned since 01/2013. Would like to follow up here for now. Declines DRE today.  Flu shot - yearly Tetanus 2010  Pneumovax 07/2002. prevnar: 01/2014 Tdap 2012 zostavax 2008 Advanced directives: has this at home. Wife [name] is HCPOA. Advised to bring [place] copy.  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 Diet: good water, fruits/vegetables daily. Significant sweet tea.  Relevant past medical, surgical, family and social history reviewed and updated as indicated. Interim medical history since our last visit reviewed. Allergies and medications reviewed and updated. Current Outpatient Prescriptions on File Prior to Visit  Medication Sig  . aspirin 81 MG tablet Take 81 mg by mouth daily.  . Coenzyme Q10 (COQ-10) 50 MG  CAPS Take by mouth daily.  . metoprolol tartrate (LOPRESSOR) 25 MG tablet Take 0.5 tablets (12.5 mg total) by mouth 2 (two) times daily.  . Multiple Vitamin (MULTIVITAMIN) tablet Take 1 tablet by mouth daily.  . multivitamin-lutein (OCUVITE-LUTEIN) CAPS capsule Take 1 capsule by mouth daily.  . Omega-3 Fatty Acids (FISH OIL) 1000 MG CAPS Take 2,000 each by mouth daily.  . Potassium Aminobenzoate 500 MG TABS Take by mouth daily.  Marland Kitchen Zn-Pyg Afri-Nettle-Saw Palmet (SAW PALMETTO COMPLEX PO) Take 250 mg by mouth every other day.    No current facility-administered medications on file prior to visit.    Review of Systems  Constitutional: Negative for fever, chills, activity change, appetite change, fatigue and unexpected weight change.  HENT: Negative for hearing loss.   Eyes: Negative for visual disturbance.  Respiratory: Negative for cough, chest tightness, shortness of breath and wheezing.   Cardiovascular: Negative for chest pain, palpitations and leg swelling.  Gastrointestinal: Negative for nausea, vomiting, abdominal pain, diarrhea, constipation, blood in stool and abdominal distention.  Genitourinary: Negative for hematuria and difficulty urinating.  Musculoskeletal: Negative for myalgias, arthralgias and neck pain.       Cramping at night   Skin: Negative for rash.  Neurological: Negative for dizziness, seizures, syncope and headaches.  Hematological: Negative for adenopathy. Does not bruise/bleed easily.  Psychiatric/Behavioral: Negative for dysphoric mood. The patient is not nervous/anxious.    Per HPI unless specifically indicated in ROS section     Objective:  BP 110/60 mmHg  Pulse 65  Temp(Src) 98.6 F (37 C) (Oral)  Ht 5' 5.5" (1.664 m)  Wt 197 lb (89.359 kg)  BMI 32.27 kg/m2  SpO2 94%  Wt Readings from Last 3 Encounters:  02/15/16 197 lb (89.359 kg)  02/15/16 197 lb (89.359 kg)  05/12/15 202 lb 4 oz (91.74 kg)    Physical Exam  Constitutional: He is oriented to  person, place, and time. He appears well-developed and well-nourished. No distress.  HENT:  Head: Normocephalic and atraumatic.  Right Ear: Hearing, tympanic membrane, external ear and ear canal normal.  Left Ear: Hearing, tympanic membrane, external ear and ear canal normal.  Nose: Nose normal.  Mouth/Throat: Uvula is midline, oropharynx is clear and moist and mucous membranes are normal. No oropharyngeal exudate, posterior oropharyngeal edema or posterior oropharyngeal erythema.  Wears hearing aides  Eyes: Conjunctivae and EOM are normal. Pupils are equal, round, and reactive to light. No scleral icterus.  Neck: Normal range of motion. Neck supple. Carotid bruit is not present. No thyromegaly present.  Cardiovascular: Normal rate, regular rhythm, normal heart sounds and intact distal pulses.   No murmur heard. Pulses:      Radial pulses are 2+ on the right side, and 2+ on the left side.  Pulmonary/Chest: Effort normal and breath sounds normal. No respiratory distress. He has no wheezes. He has no rales.  Abdominal: Soft. Bowel sounds are normal. He exhibits no distension and no mass. There is no tenderness. There is no rebound and no guarding.  Musculoskeletal: Normal range of motion. He exhibits no edema.  Lymphadenopathy:    He has no cervical adenopathy.  Neurological: He is alert and oriented to person, place, and time.  CN grossly intact, station and gait intact  Skin: Skin is warm and dry. No rash noted.  Psychiatric: He has a normal mood and affect. His behavior is normal. Judgment and thought content normal.  Nursing note and vitals reviewed.  Results for orders placed or performed in visit on 02/09/16  Lipid panel  Result Value Ref Range   Cholesterol 153 0 - 200 mg/dL   Triglycerides 95.6 0.0 - 149.0 mg/dL   HDL 21.30 >86.57 mg/dL   VLDL 9.4 0.0 - 84.6 mg/dL   LDL Cholesterol 86 0 - 99 mg/dL   Total CHOL/HDL Ratio 3    NonHDL 95.72   Comprehensive metabolic panel    Result Value Ref Range   Sodium 144 135 - 145 mEq/L   Potassium 4.6 3.5 - 5.1 mEq/L   Chloride 111 96 - 112 mEq/L   CO2 28 19 - 32 mEq/L   Glucose, Bld 109 (H) 70 - 99 mg/dL   BUN 14 6 - 23 mg/dL   Creatinine, Ser 9.62 0.40 - 1.50 mg/dL   Total Bilirubin 0.4 0.2 - 1.2 mg/dL   Alkaline Phosphatase 52 39 - 117 U/L   AST 17 0 - 37 U/L   ALT 12 0 - 53 U/L   Total Protein 6.1 6.0 - 8.3 g/dL   Albumin 3.8 3.5 - 5.2 g/dL   Calcium 9.3 8.4 - 95.2 mg/dL   GFR 84.13 >24.40 mL/min  CBC with Differential/Platelet  Result Value Ref Range   WBC 5.8 4.0 - 10.5 K/uL   RBC 3.69 (L) 4.22 - 5.81 Mil/uL   Hemoglobin 12.8 (L) 13.0 - 17.0 g/dL   HCT 10.2 (L) 72.5 - 36.6 %   MCV 103.0 (H) 78.0 - 100.0 fl   MCHC 33.6 30.0 -  36.0 g/dL   RDW 03.414.5 74.211.5 - 59.515.5 %   Platelets 190.0 150.0 - 400.0 K/uL   Neutrophils Relative % 46.2 43.0 - 77.0 %   Lymphocytes Relative 37.5 12.0 - 46.0 %   Monocytes Relative 11.7 3.0 - 12.0 %   Eosinophils Relative 4.1 0.0 - 5.0 %   Basophils Relative 0.5 0.0 - 3.0 %   Neutro Abs 2.7 1.4 - 7.7 K/uL   Lymphs Abs 2.2 0.7 - 4.0 K/uL   Monocytes Absolute 0.7 0.1 - 1.0 K/uL   Eosinophils Absolute 0.2 0.0 - 0.7 K/uL   Basophils Absolute 0.0 0.0 - 0.1 K/uL  Vitamin B12  Result Value Ref Range   Vitamin B-12 594 211 - 911 pg/mL  Ferritin  Result Value Ref Range   Ferritin 161.6 22.0 - 322.0 ng/mL  IBC panel  Result Value Ref Range   Iron 120 42 - 165 ug/dL   Transferrin 638.7203.0 (L) 212.0 - 360.0 mg/dL   Saturation Ratios 56.442.2 20.0 - 50.0 %  PSA  Result Value Ref Range   PSA 1.09 0.10 - 4.00 ng/mL  Sedimentation rate  Result Value Ref Range   Sed Rate 11 0 - 20 mm/hr      Assessment & Plan:   Problem List Items Addressed This Visit    Advanced care planning/counseling discussion    Advanced directives: has this at home. Wife Ernie HewLinda Ellis is HCPOA. Advised to bring us copy.      Alcohol dependence (HCC)    Endorses minimal alcohol use.       Elevated PSA     PSA remains stable.       Health maintenance examination - Primary    Preventative protocols reviewed and updated unless pt declined. Discussed healthy diet and lifestyle.       HTN (hypertension)    Chronic, stable. Continue current regimen.       Relevant Medications   sildenafil (VIAGRA) 100 MG tablet   Insomnia    Discussed klonopin use, suggested decreasing dose to 1/2 tab of 0.5mg  dose as needed.       Macrocytosis without anemia    Some improvement with less alcohol use. He is receiving 100% thiamine and folate in his MVI.  Check folate level next labwork.       Presbycusis of both ears    Wears hearing aides.           Follow up plan: Return in about 1 year (around 02/14/2017), or as needed, for follow up visit.  Eustaquio BoydenJavier Gauge Winski, MD

## 2016-02-15 NOTE — Patient Instructions (Addendum)
Trial viagra 1/2 tablet as needed. Coupon provided today.  Walk together with your wife, then walk separately as well.  Bring me copy of your living will to update your chart.  I'm glad you're volunteering.  Return in 1 year for next physical  Health Maintenance, Male A healthy lifestyle and preventative care can promote health and wellness.  Maintain regular health, dental, and eye exams.  Eat a healthy diet. Foods like vegetables, fruits, whole grains, low-fat dairy products, and lean protein foods contain the nutrients you need and are low in calories. Decrease your intake of foods high in solid fats, added sugars, and salt. Get information about a proper diet from your health care provider, if necessary.  Regular physical exercise is one of the most important things you can do for your health. Most adults should get at least 150 minutes of moderate-intensity exercise (any activity that increases your heart rate and causes you to sweat) each week. In addition, most adults need muscle-strengthening exercises on 2 or more days a week.   Maintain a healthy weight. The body mass index (BMI) is a screening tool to identify possible weight problems. It provides an estimate of body fat based on height and weight. Your health care provider can find your BMI and can help you achieve or maintain a healthy weight. For males 20 years and older:  A BMI below 18.5 is considered underweight.  A BMI of 18.5 to 24.9 is normal.  A BMI of 25 to 29.9 is considered overweight.  A BMI of 30 and above is considered obese.  Maintain normal blood lipids and cholesterol by exercising and minimizing your intake of saturated fat. Eat a balanced diet with plenty of fruits and vegetables. Blood tests for lipids and cholesterol should begin at age 820 and be repeated every 5 years. If your lipid or cholesterol levels are high, you are over age 150, or you are at high risk for heart disease, you may need your cholesterol  levels checked more frequently.Ongoing high lipid and cholesterol levels should be treated with medicines if diet and exercise are not working.  If you smoke, find out from your health care provider how to quit. If you do not use tobacco, do not start.  Lung cancer screening is recommended for adults aged 55-80 years who are at high risk for developing lung cancer because of a history of smoking. A yearly low-dose CT scan of the lungs is recommended for people who have at least a 30-pack-year history of smoking and are current smokers or have quit within the past 15 years. A pack year of smoking is smoking an average of 1 pack of cigarettes a day for 1 year (for example, a 30-pack-year history of smoking could mean smoking 1 pack a day for 30 years or 2 packs a day for 15 years). Yearly screening should continue until the smoker has stopped smoking for at least 15 years. Yearly screening should be stopped for people who develop a health problem that would prevent them from having lung cancer treatment.  If you choose to drink alcohol, do not have more than 2 drinks per day. One drink is considered to be 12 oz (360 mL) of beer, 5 oz (150 mL) of wine, or 1.5 oz (45 mL) of liquor.  Avoid the use of street drugs. Do not share needles with anyone. Ask for help if you need support or instructions about stopping the use of drugs.  High blood pressure causes heart  disease and increases the risk of stroke. High blood pressure is more likely to develop in:  People who have blood pressure in the end of the normal range (100-139/85-89 mm Hg).  People who are overweight or obese.  People who are African American.  If you are 74-26 years of age, have your blood pressure checked every 3-5 years. If you are 33 years of age or older, have your blood pressure checked every year. You should have your blood pressure measured twice--once when you are at a hospital or clinic, and once when you are not at a hospital or  clinic. Record the average of the two measurements. To check your blood pressure when you are not at a hospital or clinic, you can use:  An automated blood pressure machine at a pharmacy.  A home blood pressure monitor.  If you are 86-59 years old, ask your health care provider if you should take aspirin to prevent heart disease.  Diabetes screening involves taking a blood sample to check your fasting blood sugar level. This should be done once every 3 years after age 85 if you are at a normal weight and without risk factors for diabetes. Testing should be considered at a younger age or be carried out more frequently if you are overweight and have at least 1 risk factor for diabetes.  Colorectal cancer can be detected and often prevented. Most routine colorectal cancer screening begins at the age of 29 and continues through age 87. However, your health care provider may recommend screening at an earlier age if you have risk factors for colon cancer. On a yearly basis, your health care provider may provide home test kits to check for hidden blood in the stool. A small camera at the end of a tube may be used to directly examine the colon (sigmoidoscopy or colonoscopy) to detect the earliest forms of colorectal cancer. Talk to your health care provider about this at age 2 when routine screening begins. A direct exam of the colon should be repeated every 5-10 years through age 37, unless early forms of precancerous polyps or small growths are found.  People who are at an increased risk for hepatitis B should be screened for this virus. You are considered at high risk for hepatitis B if:  You were born in a country where hepatitis B occurs often. Talk with your health care provider about which countries are considered high risk.  Your parents were born in a high-risk country and you have not received a shot to protect against hepatitis B (hepatitis B vaccine).  You have HIV or AIDS.  You use needles  to inject street drugs.  You live with, or have sex with, someone who has hepatitis B.  You are a man who has sex with other men (MSM).  You get hemodialysis treatment.  You take certain medicines for conditions like cancer, organ transplantation, and autoimmune conditions.  Hepatitis C blood testing is recommended for all people born from [birth year] through 1963 and any individual with known risk factors for hepatitis C.  Healthy men should no longer receive prostate-specific antigen (PSA) blood tests as part of routine cancer screening. Talk to your health care provider about prostate cancer screening.  Testicular cancer screening is not recommended for adolescents or adult males who have no symptoms. Screening includes self-exam, a health care provider exam, and other screening tests. Consult with your health care provider about any symptoms you have or any concerns you have about testicular  cancer.  Practice safe sex. Use condoms and avoid high-risk sexual practices to reduce the spread of sexually transmitted infections (STIs).  You should be screened for STIs, including gonorrhea and chlamydia if:  You are sexually active and are younger than 24 years.  You are older than 24 years, and your health care provider tells you that you are at risk for this type of infection.  Your sexual activity has changed since you were last screened, and you are at an increased risk for chlamydia or gonorrhea. Ask your health care provider if you are at risk.  If you are at risk of being infected with HIV, it is recommended that you take a prescription medicine daily to prevent HIV infection. This is called pre-exposure prophylaxis (PrEP). You are considered at risk if:  You are a man who has sex with other men (MSM).  You are a heterosexual man who is sexually active with multiple partners.  You take drugs by injection.  You are sexually active with a partner who has HIV.  Talk with your health  care provider about whether you are at high risk of being infected with HIV. If you choose to begin PrEP, you should first be tested for HIV. You should then be tested every 3 months for as long as you are taking PrEP.  Use sunscreen. Apply sunscreen liberally and repeatedly throughout the day. You should seek shade when your shadow is shorter than you. Protect yourself by wearing long sleeves, pants, a wide-brimmed hat, and sunglasses year round whenever you are outdoors.  Tell your health care provider of new moles or changes in moles, especially if there is a change in shape or color. Also, tell your health care provider if a mole is larger than the size of a pencil eraser.  A one-time screening for abdominal aortic aneurysm (AAA) and surgical repair of large AAAs by ultrasound is recommended for men aged 43-75 years who are current or former smokers.  Stay current with your vaccines (immunizations).   This information is not intended to replace advice given to you by your health care provider. Make sure you discuss any questions you have with your health care provider.   Document Released: 01/13/2008 Document Revised: 08/07/2014 Document Reviewed: 12/12/2010 Elsevier Interactive Patient Education [employer].

## 2016-02-15 NOTE — Assessment & Plan Note (Signed)
Chronic, stable. Continue current regimen. 

## 2016-02-15 NOTE — Progress Notes (Signed)
I reviewed health advisor's note, was available for consultation, and agree with documentation and plan.  

## 2016-02-15 NOTE — Assessment & Plan Note (Signed)
Discussed klonopin use, suggested decreasing dose to 1/2 tab of 0.5mg  dose as needed.

## 2016-02-15 NOTE — Progress Notes (Signed)
Subjective:   [name] is a 82 y.o. male who presents for Medicare Annual/Subsequent preventive examination.  Review of Systems:  N/A Cardiac Risk Factors include: advanced age (>19men, >22 women);obesity (BMI >30kg/m2);male gender;hypertension     Objective:    Vitals: BP 110/60 mmHg  Pulse 65  Temp(Src) 98.6 F (37 C) (Oral)  Ht 5' 5.5" (1.664 m)  Wt 197 lb (89.359 kg)  BMI 32.27 kg/m2  SpO2 94%  Body mass index is 32.27 kg/(m^2).  Tobacco History  Smoking status  . Former Smoker -- 1.00 packs/day for 50 years  . Start date: 07/31/1948  . Quit date: 07/31/1998  Smokeless tobacco  . Never Used     Counseling given: No   Past Medical History  Diagnosis Date  . HTN (hypertension)   . Elevated PSA 2014    s/p normal biopsy 2003, now followed by Dr. Mena Goes, monitoring  . History of smoking 2000    50+ PY hx, s/p normal CT chest 2014  . Depression with anxiety   . Choledocholithiasis 2009    sepsis s/p ICU stay with VDRF  . History of alcohol abuse 1989    after bad divorce  . COPD (chronic obstructive pulmonary disease) (HCC)   . ED (erectile dysfunction)     on viagra  . OSA (obstructive sleep apnea)     could not tolerate CPAP  . DDD (degenerative disc disease)     cervical and lumbar spine  . Peyronie disease   . Colon polyp     adenomatous - [place]  . Presbycusis of both ears 2015    rec updated binaural amplification  . Chronic fungal otitis externa 2015    Jenne Pane)  . Wears hearing aid     [employer]   Past Surgical History  Procedure Laterality Date  . Tonsillectomy  1941  . Rectal fistula repair  1984  . Vasectomy  1969  . Cholecystectomy  2009  . Cardiac catheterization  2009    WNL per records, [place]  . Colonoscopy  2009    adenomatous polyp, rec rpt 5 yrs [name]  . Hospitalization  2010    ERCP with sphincertotomy and CBD stone removal - septic cholangiis with EtOH withdrawal, E coli bacteremia with septic shock,  with arterial thromboembolization with discoloration of toes of R foot  . Colonoscopy  07/2013    no polyps [name]   Family History  Problem Relation Age of Onset  . Cancer Father     throat?  Marland Kitchen Alcohol abuse Father   . Stroke Maternal Aunt     hemorrhagic  . Stroke Mother     ischemic  . Diabetes Neg Hx    History  Sexual Activity  . Sexual Activity: Yes    Outpatient Encounter Prescriptions as of 02/15/2016  Medication Sig  . aspirin 81 MG tablet Take 81 mg by mouth daily.  . clonazePAM (KLONOPIN) 0.5 MG tablet Take 1 tablet (0.5 mg total) by mouth 2 (two) times daily as needed.  . Coenzyme Q10 (COQ-10) 50 MG CAPS Take by mouth daily.  . metoprolol tartrate (LOPRESSOR) 25 MG tablet Take 0.5 tablets (12.5 mg total) by mouth 2 (two) times daily.  . Multiple Vitamin (MULTIVITAMIN) tablet Take 1 tablet by mouth daily.  . multivitamin-lutein (OCUVITE-LUTEIN) CAPS capsule Take 1 capsule by mouth daily.  . Omega-3 Fatty Acids (FISH OIL) 1000 MG CAPS Take 2,000 each by mouth daily.  . Potassium Aminobenzoate 500 MG TABS Take by  mouth daily.  Marland Kitchen Zn-Pyg Afri-Nettle-Saw Palmet (SAW PALMETTO COMPLEX PO) Take 250 mg by mouth every other day.   . [DISCONTINUED] cholecalciferol (VITAMIN D) 1000 UNITS tablet Take 1,000 Units by mouth daily.  . [DISCONTINUED] Flaxseed, Linseed, (FLAX SEED OIL PO) Take 1,200 Units by mouth every other day.  . [DISCONTINUED] Milk Thistle 250 MG CAPS Take by mouth daily.  . [DISCONTINUED] thiamine (VITAMIN B-1) 100 MG tablet Take 1 tablet (100 mg total) by mouth daily.  . [DISCONTINUED] triamterene-hydrochlorothiazide (MAXZIDE-25) 37.5-25 MG tablet Take 1 tablet by mouth every other day.   No facility-administered encounter medications on file as of 02/15/2016.    Activities of Daily Living In your present state of health, do you have any difficulty performing the following activities: 02/15/2016  Hearing? Y  Vision? N  Difficulty concentrating or making  decisions? N  Walking or climbing stairs? N  Dressing or bathing? N  Doing errands, shopping? N  Preparing Food and eating ? N  Using the Toilet? N  In the past six months, have you accidently leaked urine? N  Do you have problems with loss of bowel control? N  Managing your Medications? N  Managing your Finances? N  Housekeeping or managing your Housekeeping? N    Patient Care Team: Eustaquio Boyden, MD as PCP - General (Family Medicine) Oletha Cruel, DDS as Referring Physician (Dentistry) Harland Dingwall, MD as Referring Physician (Ophthalmology) Kathryne Hitch, MD as Consulting Physician (Orthopedic Surgery)   Assessment:    Hearing Screening Comments: Wears bilateral hearing aids Vision Screening Comments: Last vision exam on April 2017  Exercise Activities and Dietary recommendations Current Exercise Habits: Home exercise routine, Type of exercise: walking, Time (Minutes): 30, Frequency (Times/Week): 4, Weekly Exercise (Minutes/Week): 120, Intensity: Mild, Exercise limited by: None identified  Goals    . Increase physical activity     Starting 02/15/16, I will continue to walk at least 30 min 3-5 days per week.       Fall Risk Fall Risk  02/15/2016 02/15/2016 02/12/2015 02/10/2014  Falls in the past year? No No No No   Depression Screen PHQ 2/9 Scores 02/15/2016 02/15/2016 03/11/2015 02/12/2015  PHQ - 2 Score 0 0 1 3  PHQ- 9 Score - - - 9    Cognitive Testing MMSE - Mini Mental State Exam 02/15/2016  Orientation to time 5  Orientation to Place 5  Registration 3  Attention/ Calculation 0  Recall 3  Language- name 2 objects 0  Language- repeat 1  Language- follow 3 step command 3  Language- read & follow direction 0  Write a sentence 0  Copy design 0  Total score 20   PLEASE NOTE: A Mini-Cog screen was completed. Maximum score is 20. A value of 0 denotes this part of Folstein MMSE was not completed or the patient failed this part of the Mini-Cog  screening.   Mini-Cog Screening Orientation to Time - Max 5 pts Orientation to Place - Max 5 pts Registration - Max 3 pts Recall - Max 3 pts Language Repeat - Max 1 pts Language Follow 3 Step Command - Max 3 pts  Immunization History  Administered Date(s) Administered  . Hepatitis A 04/14/2003, 10/07/2003  . Influenza Whole 04/14/2013  . Influenza, High Dose Seasonal PF 04/08/2014, 03/27/2015  . Pneumococcal Conjugate-13 02/10/2014  . Pneumococcal Polysaccharide-23 08/06/2002  . Td 04/14/2003  . Tdap 12/13/2010  . Zoster 12/03/2006   Screening Tests Health Maintenance  Topic Date Due  . INFLUENZA  VACCINE  02/29/2016  . TETANUS/TDAP  12/12/2020  . DTaP/Tdap/Td  Completed  . ZOSTAVAX  Completed  . PNA vac Low Risk Adult  Completed      Plan:     I have personally reviewed and addressed the Medicare Annual Wellness questionnaire and have noted the following in the patient's chart:  A. Medical and social history B. Use of alcohol, tobacco or illicit drugs  C. Current medications and supplements D. Functional ability and status E.  Nutritional status F.  Physical activity G. Advance directives H. List of other physicians I.  Hospitalizations, surgeries, and ER visits in previous 12 months J.  Vitals K. Screenings to include hearing, vision, cognitive, depression L. Referrals and appointments - none  In addition, I have reviewed and discussed with patient certain preventive protocols, quality metrics, and best practice recommendations. A written personalized care plan for preventive services as well as general preventive health recommendations were provided to patient.  See attached scanned questionnaire for additional information.   Signed,   Randa EvensLesia Pinson, MHA, BS, LPN Health Advisor

## 2016-02-15 NOTE — Progress Notes (Signed)
PCP notes:  Health maintenance:  No gaps identified or addressed  Abnormal screenings:  None  Patient concerns:  None  Nurse concerns:   None  Next PCP appt:   02/15/16 @ 1500

## 2016-02-15 NOTE — Assessment & Plan Note (Signed)
Endorses minimal alcohol use.

## 2016-02-15 NOTE — Assessment & Plan Note (Signed)
Some improvement with less alcohol use. He is receiving 100% thiamine and folate in his MVI.  Check folate level next labwork.

## 2016-02-15 NOTE — Assessment & Plan Note (Signed)
Preventative protocols reviewed and updated unless pt declined. Discussed healthy diet and lifestyle.  

## 2016-02-15 NOTE — Assessment & Plan Note (Addendum)
Advanced directives: has this at home. Wife Ernie HewLinda Wolf is HCPOA. Advised to bring us copy.

## 2016-02-15 NOTE — Addendum Note (Signed)
Addended by: Eustaquio BoydenGUTIERREZ, Maleeyah Mccaughey on: 02/15/2016 10:45 PM   Modules accepted: Kipp BroodSmartSet

## 2016-02-15 NOTE — Patient Instructions (Signed)
[name] , Thank you for taking time to come for your Medicare Wellness Visit. I appreciate your ongoing commitment to your health goals. Please review the following plan we discussed and let me know if I can assist you in the future.   These are the goals we discussed: Goals    . Increase physical activity     Starting 02/15/16, I will continue to walk at least 30 min 3-5 days per week.        This is a list of the screening recommended for you and due dates:  Health Maintenance  Topic Date Due  . Flu Shot  02/29/2016  . Tetanus Vaccine  12/12/2020  . DTaP/Tdap/Td vaccine  Completed  . Shingles Vaccine  Completed  . Pneumonia vaccines  Completed    Preventive Care for Adults  A healthy lifestyle and preventive care can promote health and wellness. Preventive health guidelines for adults include the following key practices.  . A routine yearly physical is a good way to check with your health care provider about your health and preventive screening. It is a chance to share any concerns and updates on your health and to receive a thorough exam.  . Visit your dentist for a routine exam and preventive care every 6 months. Brush your teeth twice a day and floss once a day. Good oral hygiene prevents tooth decay and gum disease.  . The frequency of eye exams is based on your age, health, family medical history, use  of contact lenses, and other factors. Follow your health care provider's ecommendations for frequency of eye exams.  . Eat a healthy diet. Foods like vegetables, fruits, whole grains, low-fat dairy products, and lean protein foods contain the nutrients you need without too many calories. Decrease your intake of foods high in solid fats, added sugars, and salt. Eat the right amount of calories for you. Get information about a proper diet from your health care provider, if necessary.  . Regular physical exercise is one of the most important things you can do for your health.  Most adults should get at least 150 minutes of moderate-intensity exercise (any activity that increases your heart rate and causes you to sweat) each week. In addition, most adults need muscle-strengthening exercises on 2 or more days a week.  Silver Sneakers may be a benefit available to you. To determine eligibility, you may visit the website: www.silversneakers.com or contact program at [phone] Mon-Fri between 8AM-8PM.   . Maintain a healthy weight. The body mass index (BMI) is a screening tool to identify possible weight problems. It provides an estimate of body fat based on height and weight. Your health care provider can find your BMI and can help you achieve or maintain a healthy weight.   For adults 20 years and older: ? A BMI below 18.5 is considered underweight. ? A BMI of 18.5 to 24.9 is normal. ? A BMI of 25 to 29.9 is considered overweight. ? A BMI of 30 and above is considered obese.   . Maintain normal blood lipids and cholesterol levels by exercising and minimizing your intake of saturated fat. Eat a balanced diet with plenty of fruit and vegetables. Blood tests for lipids and cholesterol should begin at age 80 and be repeated every 5 years. If your lipid or cholesterol levels are high, you are over 50, or you are at high risk for heart disease, you may need your cholesterol levels checked more frequently. Ongoing high lipid and cholesterol  levels should be treated with medicines if diet and exercise are not working.  . If you smoke, find out from your health care provider how to quit. If you do not use tobacco, please do not start.  . If you choose to drink alcohol, please do not consume more than 2 drinks per day. One drink is considered to be 12 ounces (355 mL) of beer, 5 ounces (148 mL) of wine, or 1.5 ounces (44 mL) of liquor.  . If you are 74-46 years old, ask your health care provider if you should take aspirin to prevent strokes.  . Use sunscreen. Apply sunscreen  liberally and repeatedly throughout the day. You should seek shade when your shadow is shorter than you. Protect yourself by wearing long sleeves, pants, a wide-brimmed hat, and sunglasses year round, whenever you are outdoors.  . Once a month, do a whole body skin exam, using a mirror to look at the skin on your back. Tell your health care provider of new moles, moles that have irregular borders, moles that are larger than a pencil eraser, or moles that have changed in shape or color.

## 2016-02-15 NOTE — Progress Notes (Signed)
Pre visit review using our clinic review tool, if applicable. No additional management support is needed unless otherwise documented below in the visit note. 

## 2016-02-15 NOTE — Assessment & Plan Note (Signed)
Wears hearing aides 

## 2016-02-15 NOTE — Assessment & Plan Note (Signed)
PSA remains stable

## 2016-02-16 ENCOUNTER — Telehealth: Payer: Self-pay | Admitting: Family Medicine

## 2016-02-16 NOTE — Telephone Encounter (Signed)
Patient notified

## 2016-02-16 NOTE — Telephone Encounter (Signed)
This wasn't in our exam rooms. Did he leave it in yours perhaps?

## 2016-02-16 NOTE — Telephone Encounter (Signed)
No, I have not seen pt's planner.

## 2016-02-16 NOTE — Telephone Encounter (Signed)
Pt called stating he left his planner here.  Did you find it please advise pt

## 2016-02-18 ENCOUNTER — Encounter: Payer: Self-pay | Admitting: Family Medicine

## 2016-02-21 ENCOUNTER — Other Ambulatory Visit: Payer: Self-pay | Admitting: Family Medicine

## 2016-03-20 DIAGNOSIS — Z23 Encounter for immunization: Secondary | ICD-10-CM | POA: Diagnosis not present

## 2016-03-23 ENCOUNTER — Encounter: Payer: Self-pay | Admitting: Family Medicine

## 2016-04-19 ENCOUNTER — Encounter: Payer: Self-pay | Admitting: Family Medicine

## 2016-05-06 DIAGNOSIS — J069 Acute upper respiratory infection, unspecified: Secondary | ICD-10-CM | POA: Diagnosis not present

## 2016-05-11 DIAGNOSIS — J22 Unspecified acute lower respiratory infection: Secondary | ICD-10-CM | POA: Diagnosis not present

## 2016-05-11 DIAGNOSIS — Z79899 Other long term (current) drug therapy: Secondary | ICD-10-CM | POA: Diagnosis not present

## 2016-07-21 ENCOUNTER — Encounter: Payer: Self-pay | Admitting: Family Medicine

## 2016-07-25 NOTE — Telephone Encounter (Signed)
Pt called wanting lab test done; advised per note Dr Reece AgarG wanted to see pt; pt scheduled appt with DR G 07/27/16 at 10:30.

## 2016-07-27 ENCOUNTER — Ambulatory Visit (INDEPENDENT_AMBULATORY_CARE_PROVIDER_SITE_OTHER): Payer: Medicare Other | Admitting: Family Medicine

## 2016-07-27 ENCOUNTER — Encounter: Payer: Self-pay | Admitting: Family Medicine

## 2016-07-27 VITALS — BP 140/82 | HR 64 | Temp 97.6°F | Wt 198.0 lb

## 2016-07-27 DIAGNOSIS — M79675 Pain in left toe(s): Secondary | ICD-10-CM | POA: Diagnosis not present

## 2016-07-27 NOTE — Assessment & Plan Note (Signed)
Discomfort at toenail of unknown etiology. Not consistent with cramping, gout or paronychia. rec epsom salt soak, callus pad to great toe callus, update if not improving with treatment.

## 2016-07-27 NOTE — Patient Instructions (Addendum)
You have toenail irritation for some reason.  I recommend epsom salt soaks twice daily to toenail.  Try callus pad on left great toe during the day.  Let me know if not better with this.  Hold potassium for now.

## 2016-07-27 NOTE — Progress Notes (Signed)
Pre visit review using our clinic review tool, if applicable. No additional management support is needed unless otherwise documented below in the visit note. 

## 2016-07-27 NOTE — Progress Notes (Signed)
BP 140/82   Pulse 64   Temp 97.6 F (36.4 C) (Oral)   Wt 198 lb (89.8 kg)   BMI 32.45 kg/m    CC:  L great toe pain Subjective:    Patient ID: [name], male    DOB: 05/01/1933, 82 y.o.   MRN: [id number]  HPI: [name] is a 82 y.o. male presenting on 07/27/2016 for Toe Pain (left big toe; only occurs at night)   L great toenail pain started 1 month ago, only present at night in bed. Denies redness, draining. No fevers. It wakes him up from sleep at 2am, improved when he lays his foot over the edge of mattress. Does not have tight sheets on mattress. Describes sharp pain.   He does receive pedicures.  No h/o gout.   Relevant past medical, surgical, family and social history reviewed and updated as indicated. Interim medical history since our last visit reviewed. Allergies and medications reviewed and updated. Current Outpatient Prescriptions on File Prior to Visit  Medication Sig  . aspirin 81 MG tablet Take 81 mg by mouth daily.  . clonazePAM (KLONOPIN) 0.5 MG tablet Take 1 tablet (0.5 mg total) by mouth at bedtime.  . Coenzyme Q10 (COQ-10) 50 MG CAPS Take by mouth daily.  . metoprolol tartrate (LOPRESSOR) 25 MG tablet TAKE 1/2 TABLET(12.5 MG) BY MOUTH TWICE DAILY  . Multiple Vitamin (MULTIVITAMIN) tablet Take 1 tablet by mouth daily.  . multivitamin-lutein (OCUVITE-LUTEIN) CAPS capsule Take 1 capsule by mouth daily.  . Omega-3 Fatty Acids (FISH OIL) 1000 MG CAPS Take 2,000 each by mouth daily.  . Potassium Aminobenzoate 500 MG TABS Take by mouth daily.  Marland Kitchen. Zn-Pyg Afri-Nettle-Saw Palmet (SAW PALMETTO COMPLEX PO) Take 250 mg by mouth every other day.   . sildenafil (VIAGRA) 100 MG tablet Take 0.5-1 tablets (50-100 mg total) by mouth daily as needed for erectile dysfunction. (Patient not taking: Reported on 07/27/2016)   No current facility-administered medications on file prior to visit.     Review of Systems Per HPI unless specifically indicated in ROS  section     Objective:    BP 140/82   Pulse 64   Temp 97.6 F (36.4 C) (Oral)   Wt 198 lb (89.8 kg)   BMI 32.45 kg/m   Wt Readings from Last 3 Encounters:  07/27/16 198 lb (89.8 kg)  02/15/16 197 lb (89.4 kg)  02/15/16 197 lb (89.4 kg)    Physical Exam  Constitutional: He is oriented to person, place, and time. He appears well-developed and well-nourished. No distress.  Musculoskeletal: He exhibits no edema.  2+ DP on right, 1+ on left No pain to palpation at L great toe No pain with axial loading at toe Nail seems healthy - no evidence of ingrown toenail or paronychia  Neurological: He is alert and oriented to person, place, and time.  Sensation intact to light touch and 10g semmes weinstein monofilament testing  Skin: Skin is warm and dry. No rash noted. No erythema.  Psychiatric: He has a normal mood and affect.  Nursing note and vitals reviewed.  Results for orders placed or performed in visit on 02/09/16  Lipid panel  Result Value Ref Range   Cholesterol 153 0 - 200 mg/dL   Triglycerides 40.947.0 0.0 - 149.0 mg/dL   HDL 81.1956.90 >14.78>39.00 mg/dL   VLDL 9.4 0.0 - 29.540.0 mg/dL   LDL Cholesterol 86 0 - 99 mg/dL   Total CHOL/HDL Ratio 3  NonHDL 95.72   Comprehensive metabolic panel  Result Value Ref Range   Sodium 144 135 - 145 mEq/L   Potassium 4.6 3.5 - 5.1 mEq/L   Chloride 111 96 - 112 mEq/L   CO2 28 19 - 32 mEq/L   Glucose, Bld 109 (H) 70 - 99 mg/dL   BUN 14 6 - 23 mg/dL   Creatinine, Ser 1.611.08 0.40 - 1.50 mg/dL   Total Bilirubin 0.4 0.2 - 1.2 mg/dL   Alkaline Phosphatase 52 39 - 117 U/L   AST 17 0 - 37 U/L   ALT 12 0 - 53 U/L   Total Protein 6.1 6.0 - 8.3 g/dL   Albumin 3.8 3.5 - 5.2 g/dL   Calcium 9.3 8.4 - 09.610.5 mg/dL   GFR 04.5469.79 >09.81>60.00 mL/min  CBC with Differential/Platelet  Result Value Ref Range   WBC 5.8 4.0 - 10.5 K/uL   RBC 3.69 (L) 4.22 - 5.81 Mil/uL   Hemoglobin 12.8 (L) 13.0 - 17.0 g/dL   HCT 19.138.1 (L) 47.839.0 - 29.552.0 %   MCV 103.0 (H) 78.0 - 100.0 fl    MCHC 33.6 30.0 - 36.0 g/dL   RDW 62.114.5 30.811.5 - 65.715.5 %   Platelets 190.0 150.0 - 400.0 K/uL   Neutrophils Relative % 46.2 43.0 - 77.0 %   Lymphocytes Relative 37.5 12.0 - 46.0 %   Monocytes Relative 11.7 3.0 - 12.0 %   Eosinophils Relative 4.1 0.0 - 5.0 %   Basophils Relative 0.5 0.0 - 3.0 %   Neutro Abs 2.7 1.4 - 7.7 K/uL   Lymphs Abs 2.2 0.7 - 4.0 K/uL   Monocytes Absolute 0.7 0.1 - 1.0 K/uL   Eosinophils Absolute 0.2 0.0 - 0.7 K/uL   Basophils Absolute 0.0 0.0 - 0.1 K/uL  Vitamin B12  Result Value Ref Range   Vitamin B-12 594 211 - 911 pg/mL  Ferritin  Result Value Ref Range   Ferritin 161.6 22.0 - 322.0 ng/mL  IBC panel  Result Value Ref Range   Iron 120 42 - 165 ug/dL   Transferrin 846.9203.0 (L) 212.0 - 360.0 mg/dL   Saturation Ratios 62.942.2 20.0 - 50.0 %  PSA  Result Value Ref Range   PSA 1.09 0.10 - 4.00 ng/mL  Sedimentation rate  Result Value Ref Range   Sed Rate 11 0 - 20 mm/hr      Assessment & Plan:   Problem List Items Addressed This Visit    Pain of great toe, left - Primary    Discomfort at toenail of unknown etiology. Not consistent with cramping, gout or paronychia. rec epsom salt soak, callus pad to great toe callus, update if not improving with treatment.           Follow up plan: Return if symptoms worsen or fail to improve.  Eustaquio BoydenJavier Anet Logsdon, MD

## 2016-08-21 ENCOUNTER — Other Ambulatory Visit: Payer: Self-pay | Admitting: Family Medicine

## 2016-08-21 NOTE — Telephone Encounter (Signed)
Ok to refill? Historical med on 02/15/16

## 2016-08-22 ENCOUNTER — Other Ambulatory Visit: Payer: Self-pay | Admitting: Family Medicine

## 2016-08-22 ENCOUNTER — Encounter: Payer: Self-pay | Admitting: Family Medicine

## 2016-08-22 NOTE — Telephone Encounter (Signed)
Ok to refill? Appears to have been DC'd in July '17, but I can't find documentation of such in office notes or lab results.

## 2016-08-22 NOTE — Telephone Encounter (Signed)
Rx called in as directed.   

## 2016-08-22 NOTE — Telephone Encounter (Signed)
plz phone in. 

## 2016-08-23 NOTE — Telephone Encounter (Signed)
Refilled

## 2016-08-25 DIAGNOSIS — Z961 Presence of intraocular lens: Secondary | ICD-10-CM | POA: Diagnosis not present

## 2016-08-25 DIAGNOSIS — H02135 Senile ectropion of left lower eyelid: Secondary | ICD-10-CM | POA: Diagnosis not present

## 2016-08-25 DIAGNOSIS — H02132 Senile ectropion of right lower eyelid: Secondary | ICD-10-CM | POA: Diagnosis not present

## 2016-08-25 DIAGNOSIS — H04522 Eversion of left lacrimal punctum: Secondary | ICD-10-CM | POA: Diagnosis not present

## 2016-11-07 DIAGNOSIS — H02403 Unspecified ptosis of bilateral eyelids: Secondary | ICD-10-CM | POA: Diagnosis not present

## 2016-11-07 DIAGNOSIS — H02831 Dermatochalasis of right upper eyelid: Secondary | ICD-10-CM | POA: Diagnosis not present

## 2016-11-07 DIAGNOSIS — H02132 Senile ectropion of right lower eyelid: Secondary | ICD-10-CM | POA: Diagnosis not present

## 2016-11-07 DIAGNOSIS — H16143 Punctate keratitis, bilateral: Secondary | ICD-10-CM | POA: Diagnosis not present

## 2016-11-20 ENCOUNTER — Encounter: Payer: Self-pay | Admitting: Family Medicine

## 2016-12-12 DIAGNOSIS — H02135 Senile ectropion of left lower eyelid: Secondary | ICD-10-CM | POA: Diagnosis not present

## 2016-12-12 DIAGNOSIS — H02132 Senile ectropion of right lower eyelid: Secondary | ICD-10-CM | POA: Diagnosis not present

## 2017-01-15 ENCOUNTER — Other Ambulatory Visit: Payer: Self-pay | Admitting: Family Medicine

## 2017-01-25 DIAGNOSIS — Z01818 Encounter for other preprocedural examination: Secondary | ICD-10-CM | POA: Diagnosis not present

## 2017-01-25 DIAGNOSIS — I10 Essential (primary) hypertension: Secondary | ICD-10-CM | POA: Diagnosis not present

## 2017-02-13 NOTE — Progress Notes (Signed)
Subjective:   [name] is a 83 y.o. male who presents for Medicare Annual/Subsequent preventive examination.  Review of Systems:  No ROS.  Medicare Wellness Visit. Additional risk factors are reflected in the social history.  Cardiac Risk Factors include: advanced age (>4055men, 21>65 women);hypertension;male gender;obesity (BMI >30kg/m2)     Objective:    Vitals: BP (!) 152/78   Pulse 69   Temp 98 F (36.7 C) (Oral)   Resp 16   Ht 5\' 6"  (1.676 m)   Wt 189 lb 8 oz (86 kg)   SpO2 96%   BMI 30.59 kg/m   Body mass index is 30.59 kg/m.  Tobacco History  Smoking Status  . Former Smoker  . Packs/day: 1.00  . Years: 50.00  . Start date: 07/31/1948  . Quit date: 07/31/1998  Smokeless Tobacco  . Never Used     Counseling given: Not Answered   Past Medical History:  Diagnosis Date  . Choledocholithiasis 2009   sepsis s/p ICU stay with VDRF  . Chronic fungal otitis externa 2015   [facility]([place])  . Colon polyp    adenomatous - [place]  . COPD (chronic obstructive pulmonary disease) (HCC)   . DDD (degenerative disc disease)    cervical and lumbar spine  . Depression with anxiety   . ED (erectile dysfunction)    on viagra  . Elevated PSA 2014   s/p normal biopsy 2003, now followed by Dr. Mena GoesEskridge, monitoring  . History of alcohol abuse 1989   after bad divorce  . History of smoking 2000   50+ PY hx, s/p normal CT chest 2014  . HTN (hypertension)   . OSA (obstructive sleep apnea)    could not tolerate CPAP  . Peyronie disease   . Presbycusis of both ears 2015   rec updated binaural amplification  . Wears hearing aid    [employer]   Past Surgical History:  Procedure Laterality Date  . CARDIAC CATHETERIZATION  2009   [place] per [place], [place]  . CHOLECYSTECTOMY  2009  . COLONOSCOPY  2009   adenomatous polyp, rec rpt 5 yrs Evette Cristal([place])  . COLONOSCOPY  07/2013   no polyps ([place])  . hospitalization  2010   ERCP with sphincertotomy and CBD stone removal - septic  cholangiis with EtOH withdrawal, E coli bacteremia with septic shock, with arterial thromboembolization with discoloration of toes of R foot  . rectal fistula repair  1984  . TONSILLECTOMY  1941  . VASECTOMY  1969   Family History  Problem Relation Age of Onset  . Cancer Father        throat?  Marland Kitchen. Alcohol abuse Father   . Stroke Maternal Aunt        hemorrhagic  . Stroke Mother        ischemic  . Diabetes Neg Hx    History  Sexual Activity  . Sexual activity: Yes    Outpatient Encounter Prescriptions as of 02/21/2017  Medication Sig  . aspirin 81 MG tablet Take 81 mg by mouth daily.  . Cholecalciferol (D 1000) 1000 units CHEW Chew 1 tablet by mouth daily.  . Coenzyme Q10 (COQ-10) 50 MG CAPS Take by mouth daily.  . Flaxseed, Linseed, (FLAXSEED OIL) 1000 MG CAPS Take 1,200 mg by mouth daily.  . metoprolol tartrate (LOPRESSOR) 25 MG tablet TAKE 1/2 TABLET TWICE A DAY  . Milk Thistle Extract 175 MG TABS Take 250 mg by mouth daily.  . Multiple Vitamin (MULTIVITAMIN) tablet Take 1  tablet by mouth daily.  . multivitamin-lutein (OCUVITE-LUTEIN) CAPS capsule Take 1 capsule by mouth daily.  . Omega-3 Fatty Acids (FISH OIL) 1000 MG CAPS Take 2,000 each by mouth daily.  . Potassium Aminobenzoate 500 MG TABS Take by mouth daily.  Marland Kitchen Zn-Pyg Afri-Nettle-Saw Palmet (SAW PALMETTO COMPLEX PO) Take 250 mg by mouth every other day.   . clonazePAM (KLONOPIN) 0.5 MG tablet Take 0.5-1 tablets (0.25-0.5 mg total) by mouth 2 (two) times daily as needed (anxiety/sleep). (Patient not taking: Reported on 02/21/2017)  . triamterene-hydrochlorothiazide (MAXZIDE-25) 37.5-25 MG tablet TAKE 1 TABLET BY MOUTH EVERY OTHER DAY (Patient not taking: Reported on 02/21/2017)  . [DISCONTINUED] sildenafil (VIAGRA) 100 MG tablet Take 0.5-1 tablets (50-100 mg total) by mouth daily as needed for erectile dysfunction. (Patient not taking: Reported on 07/27/2016)   No facility-administered encounter medications on file as of  02/21/2017.     Activities of Daily Living In your present state of health, do you have any difficulty performing the following activities: 02/21/2017  Hearing? N  Vision? N  Difficulty concentrating or making decisions? N  Walking or climbing stairs? N  Dressing or bathing? N  Doing errands, shopping? N  Preparing Food and eating ? N  Using the Toilet? N  In the past six months, have you accidently leaked urine? N  Do you have problems with loss of bowel control? N  Managing your Medications? N  Managing your Finances? N  Housekeeping or managing your Housekeeping? N  Some recent data might be hidden    Patient Care Team: Eustaquio Boyden, MD as PCP - General (Family Medicine) Oletha Cruel, DDS (Dentistry) Marden Noble, MD (Ophthalmology)   Assessment:    Physical assessment deferred to PCP.  Exercise Activities and Dietary recommendations Current Exercise Habits: Home exercise routine, Type of exercise: walking, Frequency (Times/Week): 7  Goals    . Increase physical activity (pt-stated)          Starting 02/21/2017, I will continue to walk at least 30 min 3-5 days per week.       Fall Risk Fall Risk  02/21/2017 02/15/2016 02/15/2016 02/12/2015 02/10/2014  Falls in the past year? No No No No No   Depression Screen PHQ 2/9 Scores 02/21/2017 02/15/2016 02/15/2016 03/11/2015  PHQ - 2 Score 1 0 0 1  PHQ- 9 Score - - - -    Cognitive Function PLEASE NOTE: A Mini-Cog screen was completed. Maximum score is 20. A value of 0 denotes this part of Folstein MMSE was not completed or the patient failed this part of the Mini-Cog screening.   Mini-Cog Screening Orientation to Time - Max 5 pts Orientation to Place - Max 5 pts Registration - Max 3 pts Recall - Max 3 pts Language Repeat - Max 1 pts Language Follow 3 Step Command - Max 3 pts      Mini-Cog - 02/21/17 0818    Normal clock drawing test? yes   How many words correct? 2      MMSE - Mini Mental State  Exam 02/21/2017 02/15/2016  Orientation to time 5 5  Orientation to Place 5 5  Registration 3 3  Attention/ Calculation 0 0  Recall 2 3  Language- name 2 objects 0 0  Language- repeat 1 1  Language- follow 3 step command 3 3  Language- read & follow direction 0 0  Write a sentence 0 0  Copy design 0 0  Total score 19 20  Immunization History  Administered Date(s) Administered  . Hepatitis A 04/14/2003, 10/07/2003  . Influenza Whole 04/14/2013  . Influenza, High Dose Seasonal PF 04/08/2014, 03/27/2015  . Influenza-Unspecified 03/20/2016  . Pneumococcal Conjugate-13 02/10/2014  . Pneumococcal Polysaccharide-23 08/06/2002  . Td 04/14/2003  . Tdap 12/13/2010  . Zoster 12/03/2006   Screening Tests Health Maintenance  Topic Date Due  . INFLUENZA VACCINE  02/28/2017  . DTaP/Tdap/Td (2 - Td) 12/12/2020  . TETANUS/TDAP  12/12/2020  . PNA vac Low Risk Adult  Completed      Plan:    Follow-up w/ PCP as scheduled.  I have personally reviewed and noted the following in the patient's chart:   . Medical and social history . Use of alcohol, tobacco or illicit drugs  . Current medications and supplements . Functional ability and status . Nutritional status . Physical activity . Advanced directives . List of other physicians . Vitals . Screenings to include cognitive, depression, and falls . Referrals and appointments  In addition, I have reviewed and discussed with patient certain preventive protocols, quality metrics, and best practice recommendations. A written personalized care plan for preventive services as well as general preventive health recommendations were provided to patient.     Starla Link, RN  02/21/2017

## 2017-02-13 NOTE — Progress Notes (Signed)
PCP notes:   Health maintenance: No gaps identified.   Abnormal screenings: None   Patient concerns:  1) He is concerned that his short term memory is worsening.   2) He would like to know if he should have a bone density scan.   3) He is having eye surgery tomorrow. No concerns, just FYI.   Nurse concerns: He is not taking Maxzide and is not familiar with this medication.   Next PCP appt: Today immediately following this appt.

## 2017-02-18 ENCOUNTER — Other Ambulatory Visit: Payer: Self-pay | Admitting: Family Medicine

## 2017-02-18 DIAGNOSIS — R739 Hyperglycemia, unspecified: Secondary | ICD-10-CM

## 2017-02-18 DIAGNOSIS — D7589 Other specified diseases of blood and blood-forming organs: Secondary | ICD-10-CM

## 2017-02-18 DIAGNOSIS — R972 Elevated prostate specific antigen [PSA]: Secondary | ICD-10-CM

## 2017-02-18 DIAGNOSIS — F10288 Alcohol dependence with other alcohol-induced disorder: Secondary | ICD-10-CM

## 2017-02-19 ENCOUNTER — Other Ambulatory Visit (INDEPENDENT_AMBULATORY_CARE_PROVIDER_SITE_OTHER): Payer: Medicare Other

## 2017-02-19 DIAGNOSIS — R739 Hyperglycemia, unspecified: Secondary | ICD-10-CM | POA: Diagnosis not present

## 2017-02-19 DIAGNOSIS — F10288 Alcohol dependence with other alcohol-induced disorder: Secondary | ICD-10-CM | POA: Diagnosis not present

## 2017-02-19 DIAGNOSIS — R972 Elevated prostate specific antigen [PSA]: Secondary | ICD-10-CM

## 2017-02-19 DIAGNOSIS — D7589 Other specified diseases of blood and blood-forming organs: Secondary | ICD-10-CM

## 2017-02-19 LAB — CBC WITH DIFFERENTIAL/PLATELET
Basophils Absolute: 0 10*3/uL (ref 0.0–0.1)
Basophils Relative: 0.5 % (ref 0.0–3.0)
EOS ABS: 0.3 10*3/uL (ref 0.0–0.7)
EOS PCT: 5 % (ref 0.0–5.0)
HCT: 40.7 % (ref 39.0–52.0)
HEMOGLOBIN: 13.7 g/dL (ref 13.0–17.0)
Lymphocytes Relative: 36.8 % (ref 12.0–46.0)
Lymphs Abs: 2.3 10*3/uL (ref 0.7–4.0)
MCHC: 33.6 g/dL (ref 30.0–36.0)
MCV: 107.4 fl — ABNORMAL HIGH (ref 78.0–100.0)
MONO ABS: 0.7 10*3/uL (ref 0.1–1.0)
Monocytes Relative: 11.9 % (ref 3.0–12.0)
Neutro Abs: 2.8 10*3/uL (ref 1.4–7.7)
Neutrophils Relative %: 45.8 % (ref 43.0–77.0)
Platelets: 198 10*3/uL (ref 150.0–400.0)
RBC: 3.79 Mil/uL — AB (ref 4.22–5.81)
RDW: 14.3 % (ref 11.5–15.5)
WBC: 6.1 10*3/uL (ref 4.0–10.5)

## 2017-02-19 LAB — LIPID PANEL
CHOLESTEROL: 183 mg/dL (ref 0–200)
HDL: 82.7 mg/dL (ref >39.00)
LDL CALC: 90 mg/dL (ref 0–99)
NonHDL: 100.64
Total CHOL/HDL Ratio: 2
Triglycerides: 53 mg/dL (ref 0.0–149.0)
VLDL: 10.6 mg/dL (ref 0.0–40.0)

## 2017-02-19 LAB — COMPREHENSIVE METABOLIC PANEL
ALBUMIN: 4 g/dL (ref 3.5–5.2)
ALT: 14 U/L (ref 0–53)
AST: 20 U/L (ref 0–37)
Alkaline Phosphatase: 57 U/L (ref 39–117)
BILIRUBIN TOTAL: 0.7 mg/dL (ref 0.2–1.2)
BUN: 12 mg/dL (ref 6–23)
CHLORIDE: 105 meq/L (ref 96–112)
CO2: 27 mEq/L (ref 19–32)
CREATININE: 1.07 mg/dL (ref 0.40–1.50)
Calcium: 9.7 mg/dL (ref 8.4–10.5)
GFR: 70.36 mL/min (ref >60.00)
Glucose, Bld: 104 mg/dL — ABNORMAL HIGH (ref 70–99)
Potassium: 4.4 mEq/L (ref 3.5–5.1)
SODIUM: 141 meq/L (ref 135–145)
TOTAL PROTEIN: 6.4 g/dL (ref 6.0–8.3)

## 2017-02-19 LAB — HEMOGLOBIN A1C: HEMOGLOBIN A1C: 5.4 % (ref 4.6–6.5)

## 2017-02-19 LAB — PSA: PSA: 2.87 ng/mL (ref 0.10–4.00)

## 2017-02-21 ENCOUNTER — Ambulatory Visit (INDEPENDENT_AMBULATORY_CARE_PROVIDER_SITE_OTHER): Payer: Medicare Other | Admitting: Family Medicine

## 2017-02-21 ENCOUNTER — Ambulatory Visit (INDEPENDENT_AMBULATORY_CARE_PROVIDER_SITE_OTHER): Payer: Medicare Other

## 2017-02-21 ENCOUNTER — Telehealth: Payer: Self-pay

## 2017-02-21 VITALS — BP 152/82 | HR 69 | Temp 98.0°F | Resp 16 | Ht 66.0 in | Wt 189.5 lb

## 2017-02-21 DIAGNOSIS — Z0000 Encounter for general adult medical examination without abnormal findings: Secondary | ICD-10-CM

## 2017-02-21 NOTE — Patient Instructions (Addendum)
[name] , Thank you for taking time to come for your Medicare Wellness Visit. I appreciate your ongoing commitment to your health goals. Please review the following plan we discussed and let me know if I can assist you in the future.   These are the goals we discussed: Goals    . Increase physical activity (pt-stated)          Starting 02/21/2017, I will continue to walk at least 30 min 3-5 days per week.        This is a list of the screening recommended for you and due dates:  Health Maintenance  Topic Date Due  . Flu Shot  02/28/2017  . DTaP/Tdap/Td vaccine (2 - Td) 12/12/2020  . Tetanus Vaccine  12/12/2020  . Pneumonia vaccines  Completed   Preventive Care for Adults  A healthy lifestyle and preventive care can promote health and wellness. Preventive health guidelines for adults include the following key practices.  . A routine yearly physical is a good way to check with your health care provider about your health and preventive screening. It is a chance to share any concerns and updates on your health and to receive a thorough exam.  . Visit your dentist for a routine exam and preventive care every 6 months. Brush your teeth twice a day and floss once a day. Good oral hygiene prevents tooth decay and gum disease.  . The frequency of eye exams is based on your age, health, family medical history, use  of contact lenses, and other factors. Follow your health care provider's ecommendations for frequency of eye exams.  . Eat a healthy diet. Foods like vegetables, fruits, whole grains, low-fat dairy products, and lean protein foods contain the nutrients you need without too many calories. Decrease your intake of foods high in solid fats, added sugars, and salt. Eat the right amount of calories for you. Get information about a proper diet from your health care provider, if necessary.  . Regular physical exercise is one of the most important things you can do for your health. Most  adults should get at least 150 minutes of moderate-intensity exercise (any activity that increases your heart rate and causes you to sweat) each week. In addition, most adults need muscle-strengthening exercises on 2 or more days a week.  Silver Sneakers may be a benefit available to you. To determine eligibility, you may visit the website: www.silversneakers.com or contact program at [phone] Mon-Fri between 8AM-8PM.   . Maintain a healthy weight. The body mass index (BMI) is a screening tool to identify possible weight problems. It provides an estimate of body fat based on height and weight. Your health care provider can find your BMI and can help you achieve or maintain a healthy weight.   For adults 20 years and older: ? A BMI below 18.5 is considered underweight. ? A BMI of 18.5 to 24.9 is normal. ? A BMI of 25 to 29.9 is considered overweight. ? A BMI of 30 and above is considered obese.   . Maintain normal blood lipids and cholesterol levels by exercising and minimizing your intake of saturated fat. Eat a balanced diet with plenty of fruit and vegetables. Blood tests for lipids and cholesterol should begin at age 81 and be repeated every 5 years. If your lipid or cholesterol levels are high, you are over 50, or you are at high risk for heart disease, you may need your cholesterol levels checked more frequently. Ongoing high lipid and  cholesterol levels should be treated with medicines if diet and exercise are not working.  . If you smoke, find out from your health care provider how to quit. If you do not use tobacco, please do not start.  . If you choose to drink alcohol, please do not consume more than 2 drinks per day. One drink is considered to be 12 ounces (355 mL) of beer, 5 ounces (148 mL) of wine, or 1.5 ounces (44 mL) of liquor.  . If you are 87-2 years old, ask your health care provider if you should take aspirin to prevent strokes.  . Use sunscreen. Apply sunscreen  liberally and repeatedly throughout the day. You should seek shade when your shadow is shorter than you. Protect yourself by wearing long sleeves, pants, a wide-brimmed hat, and sunglasses year round, whenever you are outdoors.  . Once a month, do a whole body skin exam, using a mirror to look at the skin on your back. Tell your health care provider of new moles, moles that have irregular borders, moles that are larger than a pencil eraser, or moles that have changed in shape or color.

## 2017-02-21 NOTE — Progress Notes (Signed)
I reviewed health advisor's note, was available for consultation, and agree with documentation and plan.  

## 2017-02-21 NOTE — Telephone Encounter (Signed)
Noted. Appt with me was at 9:15am. I was ready to see patient at 9:35am.

## 2017-02-21 NOTE — Telephone Encounter (Signed)
Pt was seen by Eber Jonesarolyn for Part 1 of his Medicare Wellness Exam and was then scheduled to see Dr Sharen HonesGutierrez at 915am for part 2. Pt was done early with his Part 1 exam and was roomed to wait for Dr Sharen HonesGutierrez. At 925am, Laurena SlimmerWaynetta Knight, CMA went and checked on him and give him an update that it would be about 5 more minutes. I heard Mr. Charm BargesButler tell Marcelline MatesWaynetta that it was ridiculous to wait this long and he did not want to stay. Marcelline MatesWaynetta tried to talk to him in a calm manner and reassure him that it would only be about 5 more minutes. He still said he wanted to leave. She came out of the room and asked if I would come talk to him. I went in and asked if there was anything I could get him while he waited for Dr Sharen HonesGutierrez. He responded that he was not going to wait and wanted his paperwork so he could leave. He was tired of waiting and said he had other things to do. He said he would go find a different doctor. I told him there was no paperwork because he had been seen yet. He said he wanted his lab results. I told him I would be glad to get his lab results and he could leave if he liked. I handed him his labs and he continued to sit and look at them. I asked him if he was intending to stay since he was sitting there reading his labs. He said if Dr Sharen HonesGutierrez is in the room by the time he got done reading his labs, he would see the doctor. I told him I would leave the door open for him if he wanted to leave when he got done. He then proceeded to leave. I went up front and asked Gillis Santamily Hunt to remove him from the schedule and went and advised Barrington EllisonAmanda Wagoner, RN about the situation. By that time, Dr Sharen HonesGutierrez was out from seeing the patient before this patient.

## 2017-02-22 DIAGNOSIS — H02105 Unspecified ectropion of left lower eyelid: Secondary | ICD-10-CM | POA: Diagnosis not present

## 2017-02-22 DIAGNOSIS — Z7982 Long term (current) use of aspirin: Secondary | ICD-10-CM | POA: Diagnosis not present

## 2017-02-22 DIAGNOSIS — H02413 Mechanical ptosis of bilateral eyelids: Secondary | ICD-10-CM | POA: Diagnosis not present

## 2017-02-22 DIAGNOSIS — Z79899 Other long term (current) drug therapy: Secondary | ICD-10-CM | POA: Diagnosis not present

## 2017-02-22 DIAGNOSIS — H02831 Dermatochalasis of right upper eyelid: Secondary | ICD-10-CM | POA: Diagnosis not present

## 2017-02-22 DIAGNOSIS — H02132 Senile ectropion of right lower eyelid: Secondary | ICD-10-CM | POA: Diagnosis not present

## 2017-02-22 DIAGNOSIS — H02834 Dermatochalasis of left upper eyelid: Secondary | ICD-10-CM | POA: Diagnosis not present

## 2017-02-22 DIAGNOSIS — H02102 Unspecified ectropion of right lower eyelid: Secondary | ICD-10-CM | POA: Diagnosis not present

## 2017-02-22 DIAGNOSIS — H02402 Unspecified ptosis of left eyelid: Secondary | ICD-10-CM | POA: Diagnosis not present

## 2017-02-22 DIAGNOSIS — I10 Essential (primary) hypertension: Secondary | ICD-10-CM | POA: Diagnosis not present

## 2017-02-22 DIAGNOSIS — Z87891 Personal history of nicotine dependence: Secondary | ICD-10-CM | POA: Diagnosis not present

## 2017-02-22 NOTE — Progress Notes (Signed)
Patient decided to leave prior to being seen as I was running 20 min behind

## 2017-03-12 ENCOUNTER — Encounter: Payer: Self-pay | Admitting: Family Medicine

## 2017-03-12 ENCOUNTER — Ambulatory Visit (INDEPENDENT_AMBULATORY_CARE_PROVIDER_SITE_OTHER): Payer: Medicare Other | Admitting: Family Medicine

## 2017-03-12 VITALS — BP 146/78 | HR 73 | Temp 98.1°F | Ht 66.0 in | Wt 187.5 lb

## 2017-03-12 DIAGNOSIS — F329 Major depressive disorder, single episode, unspecified: Secondary | ICD-10-CM | POA: Diagnosis not present

## 2017-03-12 DIAGNOSIS — D7589 Other specified diseases of blood and blood-forming organs: Secondary | ICD-10-CM | POA: Diagnosis not present

## 2017-03-12 DIAGNOSIS — I10 Essential (primary) hypertension: Secondary | ICD-10-CM

## 2017-03-12 DIAGNOSIS — J449 Chronic obstructive pulmonary disease, unspecified: Secondary | ICD-10-CM | POA: Diagnosis not present

## 2017-03-12 DIAGNOSIS — Z0000 Encounter for general adult medical examination without abnormal findings: Secondary | ICD-10-CM

## 2017-03-12 DIAGNOSIS — R972 Elevated prostate specific antigen [PSA]: Secondary | ICD-10-CM | POA: Diagnosis not present

## 2017-03-12 DIAGNOSIS — F10288 Alcohol dependence with other alcohol-induced disorder: Secondary | ICD-10-CM

## 2017-03-12 DIAGNOSIS — Z7189 Other specified counseling: Secondary | ICD-10-CM

## 2017-03-12 DIAGNOSIS — F32A Depression, unspecified: Secondary | ICD-10-CM

## 2017-03-12 MED ORDER — TRIAMTERENE-HCTZ 37.5-25 MG PO TABS: 1.0000 | ORAL_TABLET | ORAL | 1 refills | Status: DC

## 2017-03-12 MED ORDER — METOPROLOL TARTRATE 25 MG PO TABS: 12.5000 mg | ORAL_TABLET | Freq: Two times a day (BID) | ORAL | 1 refills | Status: DC

## 2017-03-12 NOTE — Assessment & Plan Note (Signed)
Advanced directives: has this at home. Wife Ernie HewLinda Ellis is HCPOA.

## 2017-03-12 NOTE — Progress Notes (Signed)
BP (!) 146/78   Pulse 73   Temp 98.1 F (36.7 C) (Oral)   Ht 5\' 6"  (1.676 m)   Wt 187 lb 8 oz (85 kg)   SpO2 95%   BMI 30.26 kg/m    CC: CPE Subjective:    Patient ID: [name], male    DOB: Dec 27, 1932, 83 y.o.   MRN: [id number]  HPI: [name] is a 83 y.o. male presenting on 03/12/2017 for Annual Exam (part 2)   Saw [name] last month for medicare wellness visit, note reviewed. Finds short term memory has worsened.   Remains upset about having to wait for me 20 minutes last month and states he is planning on establishing with a new provider due to this.   Left blepharoplasty last week. Recovering well from this.   Bought blood pressure cuff - brings log showing bp 130-150/60-80. He has not been taking maxzide.   Alcohol - has decreased drinking to 1 glass wine or a beer with dinner.  Continues working at [employer], as well as at [employer].  He is off klonopin.   Preventative: COLONOSCOPY Date: 07/2013 no polyps [name])  Prostate - elevated PSA in past with normal biopsy per patient - this was followed by Dr. Mena Goes in past Q6 mo active surveillance. Has not returned since 01/2013. Does not want to return to uro.  Lung cancer screening - not eligible Flu shot - yearly Tetanus 2010  Pneumovax 07/2002. prevnar: 01/2014 Tdap 2012 zostavax 2008 Advanced directives: has this at home. Wife [name] is HCPOA.  Ex-smoker - quit 2000 (50 PY hx) Alcohol - states he averages 1 drink a day  Lives with [name] wife (married since 2005) Occupation: Medical sales representative, retired Edu: 18+ yrs Activity: walking regularly, but not as much as he would like to - wife has trouble keeping up. Diet: good water, fruits/vegetables daily. Significant sweet tea.  Relevant past medical, surgical, family and social history reviewed and updated as indicated. Interim medical history since our last visit reviewed. Allergies and medications reviewed and updated. Outpatient  Medications Prior to Visit  Medication Sig Dispense Refill  . aspirin 81 MG tablet Take 81 mg by mouth daily.    . Cholecalciferol (D 1000) 1000 units CHEW Chew 1 tablet by mouth daily.    . Coenzyme Q10 (COQ-10) 50 MG CAPS Take by mouth daily.    . Flaxseed, Linseed, (FLAXSEED OIL) 1000 MG CAPS Take 1,200 mg by mouth daily.    . Milk Thistle Extract 175 MG TABS Take 250 mg by mouth daily.    . Multiple Vitamin (MULTIVITAMIN) tablet Take 1 tablet by mouth daily.    . multivitamin-lutein (OCUVITE-LUTEIN) CAPS capsule Take 1 capsule by mouth daily.    . Omega-3 Fatty Acids (FISH OIL) 1000 MG CAPS Take 2,000 each by mouth daily.    . Potassium Aminobenzoate 500 MG TABS Take by mouth daily.    Marland Kitchen Zn-Pyg Afri-Nettle-Saw Palmet (SAW PALMETTO COMPLEX PO) Take 250 mg by mouth every other day.     . metoprolol tartrate (LOPRESSOR) 25 MG tablet TAKE 1/2 TABLET TWICE A DAY 90 tablet 1  . clonazePAM (KLONOPIN) 0.5 MG tablet Take 0.5-1 tablets (0.25-0.5 mg total) by mouth 2 (two) times daily as needed (anxiety/sleep). (Patient not taking: Reported on 02/21/2017) 60 tablet 1  . triamterene-hydrochlorothiazide (MAXZIDE-25) 37.5-25 MG tablet TAKE 1 TABLET BY MOUTH EVERY OTHER DAY (Patient not taking: Reported on 03/12/2017) 45 tablet 1   No facility-administered medications  prior to visit.      Per HPI unless specifically indicated in ROS section below Review of Systems  Constitutional: Negative for activity change, appetite change, chills, fatigue, fever and unexpected weight change.  HENT: Negative for hearing loss.   Eyes: Negative for visual disturbance.  Respiratory: Negative for cough, chest tightness, shortness of breath and wheezing.   Cardiovascular: Negative for chest pain, palpitations and leg swelling.  Gastrointestinal: Negative for abdominal distention, abdominal pain, blood in stool, constipation, diarrhea, nausea and vomiting.  Genitourinary: Negative for difficulty urinating and hematuria.    Musculoskeletal: Negative for arthralgias, myalgias and neck pain.  Skin: Negative for rash.  Neurological: Negative for dizziness, seizures, syncope and headaches.  Hematological: Negative for adenopathy. Does not bruise/bleed easily.  Psychiatric/Behavioral: Negative for dysphoric mood. The patient is not nervous/anxious.        Objective:    BP (!) 146/78   Pulse 73   Temp 98.1 F (36.7 C) (Oral)   Ht 5\' 6"  (1.676 m)   Wt 187 lb 8 oz (85 kg)   SpO2 95%   BMI 30.26 kg/m   Wt Readings from Last 3 Encounters:  03/12/17 187 lb 8 oz (85 kg)  02/21/17 189 lb 8 oz (86 kg)  02/21/17 189 lb 8 oz (86 kg)    Physical Exam  Constitutional: He is oriented to person, place, and time. He appears well-developed and well-nourished. No distress.  HENT:  Head: Normocephalic and atraumatic.  Right Ear: Hearing, tympanic membrane, external ear and ear canal normal.  Left Ear: Hearing, tympanic membrane, external ear and ear canal normal.  Nose: Nose normal.  Mouth/Throat: Uvula is midline, oropharynx is clear and moist and mucous membranes are normal. No oropharyngeal exudate, posterior oropharyngeal edema or posterior oropharyngeal erythema.  Eyes: Pupils are equal, round, and reactive to light. Conjunctivae and EOM are normal. No scleral icterus.  Neck: Normal range of motion. Neck supple. No thyromegaly present.  Cardiovascular: Normal rate, regular rhythm, normal heart sounds and intact distal pulses.   No murmur heard. Pulses:      Radial pulses are 2+ on the right side, and 2+ on the left side.  Pulmonary/Chest: Effort normal and breath sounds normal. No respiratory distress. He has no wheezes. He has no rales.  Abdominal: Soft. Bowel sounds are normal. He exhibits no distension and no mass. There is no tenderness. There is no rebound and no guarding.  Musculoskeletal: Normal range of motion. He exhibits no edema.  Lymphadenopathy:    He has no cervical adenopathy.  Neurological: He  is alert and oriented to person, place, and time.  CN grossly intact, station and gait intact  Skin: Skin is warm and dry. No rash noted.  Psychiatric: He has a normal mood and affect. His behavior is normal. Judgment and thought content normal.  Nursing note and vitals reviewed.  Results for orders placed or performed in visit on 02/19/17  Comprehensive metabolic panel  Result Value Ref Range   Sodium 141 135 - 145 mEq/L   Potassium 4.4 3.5 - 5.1 mEq/L   Chloride 105 96 - 112 mEq/L   CO2 27 19 - 32 mEq/L   Glucose, Bld 104 (H) 70 - 99 mg/dL   BUN 12 6 - 23 mg/dL   Creatinine, Ser 0.98 0.40 - 1.50 mg/dL   Total Bilirubin 0.7 0.2 - 1.2 mg/dL   Alkaline Phosphatase 57 39 - 117 U/L   AST 20 0 - 37 U/L   ALT 14  0 - 53 U/L   Total Protein 6.4 6.0 - 8.3 g/dL   Albumin 4.0 3.5 - 5.2 g/dL   Calcium 9.7 8.4 - 86.510.5 mg/dL   GFR 78.4670.36 >96.29>60.00 mL/min  Lipid panel  Result Value Ref Range   Cholesterol 183 0 - 200 mg/dL   Triglycerides 52.853.0 0.0 - 149.0 mg/dL   HDL 41.3282.70 >44.01>39.00 mg/dL   VLDL 02.710.6 0.0 - 25.340.0 mg/dL   LDL Cholesterol 90 0 - 99 mg/dL   Total CHOL/HDL Ratio 2    NonHDL 100.64   Hemoglobin A1c  Result Value Ref Range   Hgb A1c MFr Bld 5.4 4.6 - 6.5 %  CBC with Differential/Platelet  Result Value Ref Range   WBC 6.1 4.0 - 10.5 K/uL   RBC 3.79 (L) 4.22 - 5.81 Mil/uL   Hemoglobin 13.7 13.0 - 17.0 g/dL   HCT 66.440.7 40.339.0 - 47.452.0 %   MCV 107.4 (H) 78.0 - 100.0 fl   MCHC 33.6 30.0 - 36.0 g/dL   RDW 25.914.3 56.311.5 - 87.515.5 %   Platelets 198.0 150.0 - 400.0 K/uL   Neutrophils Relative % 45.8 43.0 - 77.0 %   Lymphocytes Relative 36.8 12.0 - 46.0 %   Monocytes Relative 11.9 3.0 - 12.0 %   Eosinophils Relative 5.0 0.0 - 5.0 %   Basophils Relative 0.5 0.0 - 3.0 %   Neutro Abs 2.8 1.4 - 7.7 K/uL   Lymphs Abs 2.3 0.7 - 4.0 K/uL   Monocytes Absolute 0.7 0.1 - 1.0 K/uL   Eosinophils Absolute 0.3 0.0 - 0.7 K/uL   Basophils Absolute 0.0 0.0 - 0.1 K/uL  PSA  Result Value Ref Range   PSA 2.87  0.10 - 4.00 ng/mL      Assessment & Plan:   Problem List Items Addressed This Visit    Advanced care planning/counseling discussion    Advanced directives: has this at home. Wife Kevin HewLinda Wolf is HCPOA.       Alcohol dependence (HCC)    Continue to encourage alcohol cessation. He states he is drinking 1 alcoholic beverage a day. He has previously minimized alcohol use.       COPD (chronic obstructive pulmonary disease) (HCC)    Stable off breathing medication.       Depression    Pt denies significant trouble at this time.       Elevated PSA    PSA stable. Will stop screening.       Health maintenance examination - Primary    Preventative protocols reviewed and updated unless pt declined. Discussed healthy diet and lifestyle.       HTN (hypertension)    Chronic, some elevated readings. He had not been taking maxzide. Advised restart at prior dose of 1 tab QOD.       Relevant Medications   triamterene-hydrochlorothiazide (MAXZIDE-25) 37.5-25 MG tablet   metoprolol tartrate (LOPRESSOR) 25 MG tablet   Macrocytosis without anemia    Chronic. Anticipate alcohol related. S/p stable periph smear 2016. Consider folate check next labs.           Follow up plan: Return if symptoms worsen or fail to improve.  Eustaquio BoydenJavier Veola Cafaro, MD

## 2017-03-12 NOTE — Patient Instructions (Addendum)
Restart maxzide blood pressure medicine every other day (sent to your pharmacy) - and keep monitoring blood pressures at home, watch for any low blood pressure readings.  Continue walking regularly. Best wishes to you!

## 2017-03-12 NOTE — Assessment & Plan Note (Signed)
Preventative protocols reviewed and updated unless pt declined. Discussed healthy diet and lifestyle.  

## 2017-03-13 NOTE — Assessment & Plan Note (Signed)
Continue to encourage alcohol cessation. He states he is drinking 1 alcoholic beverage a day. He has previously minimized alcohol use.

## 2017-03-13 NOTE — Assessment & Plan Note (Signed)
Chronic. Anticipate alcohol related. S/p stable periph smear 2016. Consider folate check next labs.

## 2017-03-13 NOTE — Assessment & Plan Note (Signed)
PSA stable. Will stop screening.

## 2017-03-13 NOTE — Assessment & Plan Note (Signed)
Chronic, some elevated readings. He had not been taking maxzide. Advised restart at prior dose of 1 tab QOD.

## 2017-03-13 NOTE — Assessment & Plan Note (Signed)
Pt denies significant trouble at this time.

## 2017-03-13 NOTE — Assessment & Plan Note (Signed)
Stable off breathing medication.

## 2017-04-07 DIAGNOSIS — Z23 Encounter for immunization: Secondary | ICD-10-CM | POA: Diagnosis not present

## 2017-04-16 DIAGNOSIS — H26491 Other secondary cataract, right eye: Secondary | ICD-10-CM | POA: Diagnosis not present

## 2017-04-16 DIAGNOSIS — H02132 Senile ectropion of right lower eyelid: Secondary | ICD-10-CM | POA: Diagnosis not present

## 2017-04-16 DIAGNOSIS — H11823 Conjunctivochalasis, bilateral: Secondary | ICD-10-CM | POA: Diagnosis not present

## 2017-04-16 DIAGNOSIS — H02135 Senile ectropion of left lower eyelid: Secondary | ICD-10-CM | POA: Diagnosis not present

## 2017-07-02 ENCOUNTER — Other Ambulatory Visit: Payer: Self-pay | Admitting: Family Medicine

## 2017-08-10 ENCOUNTER — Telehealth: Payer: Self-pay | Admitting: Family Medicine

## 2017-08-10 MED ORDER — HYDROXYZINE HCL 25 MG PO TABS: 12.5000 mg | ORAL_TABLET | Freq: Two times a day (BID) | ORAL | 0 refills | Status: DC | PRN

## 2017-08-10 NOTE — Telephone Encounter (Signed)
Pt requesting Clonazepam prescription

## 2017-08-10 NOTE — Telephone Encounter (Signed)
I assume he needs it for anxiety? I don't recommend we restart clonazepam. May try hydroxyzine PRN anxiety - don't take and drive as it can also make him sleepy. Start at 1/2 pill at a time.  Also, last visit he mentioned he was planning on establishing with new provider closer to home as he was also upset at wait time to see me. Would encourage him to establish with new provider.

## 2017-08-10 NOTE — Telephone Encounter (Signed)
Rx not on pt's current med list. I do see in med hx, pt has taken before.  Last OV (CPE):  03/12/17 Next OV:  03/14/18

## 2017-08-10 NOTE — Telephone Encounter (Signed)
Copied from CRM [phone]. Topic: Quick Communication - Rx Refill/Question >> Aug 10, 2017 11:00 AM Jolayne Hainesaylor, Brittany L wrote: Medication: clonazepam (said he took it for a long time & he quit but now he needs it again)   Has the patient contacted their pharmacy? Yes, they advised him to call his dr to get a new script faxed to the pharmacy   (Agent: If no, request that the patient contact the pharmacy for the refill.)   Preferred Pharmacy (with phone number or street name): [id number] [place] [place] - [address]   Agent: Please be advised that RX refills may take up to 3 business days. We ask that you follow-up with your pharmacy.

## 2017-08-13 ENCOUNTER — Other Ambulatory Visit: Payer: Self-pay | Admitting: Family Medicine

## 2017-08-13 NOTE — Telephone Encounter (Signed)
Spoke with pt about the clonazepam rx.  Says he was requesting it to help him sleep.  I also relayed message per Dr. Reece AgarG.  Pt says he does not understand why he can not be given something that he knows works.  And as far as establishing with at new provider, says it would cost him more to do that than to see Dr. Reece AgarG once a year.    Gives permission to lvm with response if Dr. Reece AgarG is going to do give pt rx for clonazepam or "whatever he decides to do."

## 2017-08-13 NOTE — Telephone Encounter (Signed)
In alcohol use history I don't recommend any medication that is alcohol - like such as any benzodiazepine. He may try hydroxyzine. If ineffective we can try other medications.

## 2017-08-14 NOTE — Telephone Encounter (Signed)
Spoke with pt relaying message per Dr. Anne NgG.   Says he quit taking clonazepam because he started sleeping better. But then sleep was off again. So he started taking some of an old rx, which helped. However, pt states his willing to try the hydroxyzine and will let Dr. Reece AgarG know how it works.  Also, pt requests all meds after this point [facility] [address]. [facility][Marked pharmacy as primary.]

## 2017-08-15 ENCOUNTER — Encounter: Payer: Self-pay | Admitting: Family Medicine

## 2017-08-15 NOTE — Telephone Encounter (Signed)
Spoke with [facility] [address].  Says rx needed PA.  Gave me phn # [phone] to call.  Called above number, did PA.  Says they will fax decision.

## 2017-08-16 ENCOUNTER — Telehealth: Payer: Self-pay | Admitting: Family Medicine

## 2017-08-16 MED ORDER — TRAZODONE HCL 50 MG PO TABS: 25.0000 mg | ORAL_TABLET | Freq: Every evening | ORAL | 3 refills | Status: DC | PRN

## 2017-08-16 NOTE — Telephone Encounter (Signed)
Spoke with [employer] about PA.  Answered question needed to complete PA.  Says it came back approved. They will fax official approval.

## 2017-08-16 NOTE — Telephone Encounter (Signed)
Spoke with [facility] [address] and canceled hydroxyzine rx.

## 2017-08-16 NOTE — Telephone Encounter (Signed)
See pt email. I've sent in a replacement to hydroxyzine. I want him taking trazodone, not hydroxyzine - plz call pharmacy and cancel hydroxyzine script.

## 2017-08-16 NOTE — Telephone Encounter (Signed)
Received notice from [employer] - insurance will likely not cover this medication. I would like him to try in its place trazodone 50mg  1/2-1 tablet nightly for sleep. Start at 1/2 tablet first. I have sent this in to his pharmacy. We can review effect at next office visit. I have sent message through MyChart.

## 2017-08-16 NOTE — Telephone Encounter (Signed)
Copied from CRM [phone]. Topic: Quick Communication - See Telephone Encounter >> Aug 16, 2017  3:13 PM Everardo PacificMoton, Sandrea Boer, [place] wrote: CRM for notification. See Telephone encounter for: [name] called because [employer] needs the question answered for the prior authoraztion for his Hydroxyzine-Hlc 25 mg tablet. They can be reached at [phone] option 5  08/16/17.

## 2017-08-17 ENCOUNTER — Telehealth: Payer: Self-pay | Admitting: Family Medicine

## 2017-08-17 NOTE — Telephone Encounter (Signed)
Noted  

## 2017-08-17 NOTE — Telephone Encounter (Signed)
Copied from CRM [id number]. Topic: Quick Communication - See Telephone Encounter >> Aug 17, 2017  8:46 AM Landry MellowFoltz, Melissa J wrote: CRM for notification. See Telephone encounter for:   08/17/17.[employer] called - the hydroxozine PA has been approved as of 08/15/17 for 1 year.  No cb needed

## 2017-08-23 ENCOUNTER — Telehealth: Payer: Self-pay | Admitting: Family Medicine

## 2017-08-23 DIAGNOSIS — G4700 Insomnia, unspecified: Secondary | ICD-10-CM

## 2017-08-23 NOTE — Telephone Encounter (Signed)
Pt dropped off sealed envelope. Pt states he would like Dr Sharen HonesGutierrez to address some concerns noted in his letter before his August appt. Placed envelope in RX tower up front.

## 2017-08-23 NOTE — Telephone Encounter (Signed)
Placed envelope in Dr. Timoteo ExposeG's box.

## 2017-08-26 ENCOUNTER — Encounter: Payer: Self-pay | Admitting: Family Medicine

## 2017-08-27 MED ORDER — HYDROXYZINE HCL 25 MG PO TABS: 25.0000 mg | ORAL_TABLET | Freq: Every evening | ORAL | 0 refills | Status: DC | PRN

## 2017-08-27 NOTE — Addendum Note (Signed)
Addended by: Eustaquio BoydenGUTIERREZ, Delante Karapetyan on: 08/27/2017 08:02 AM   Modules accepted: Orders

## 2017-08-27 NOTE — Telephone Encounter (Addendum)
Trazodone, melatonin ineffective. plz notify patient - I'd like him to first try hydroxyzine which I've sent to the pharmacy. This is the first med we prescribed (that his insurance has now approved). Watch for constipation or urinary retention on medicine.  There are several other medications we should try before returning to benzo like klonopin.

## 2017-08-27 NOTE — Telephone Encounter (Signed)
Spoke with pt relaying message per Dr. Reece AgarG.  Pt says he is aware of this information and has picked up the med.  He will try it for another 5 days then will be on the myChart to Dr. Reece AgarG asking to return to the other medication.

## 2017-09-10 ENCOUNTER — Ambulatory Visit: Payer: Medicare Other | Admitting: Family Medicine

## 2017-09-10 ENCOUNTER — Encounter: Payer: Self-pay | Admitting: Radiology

## 2017-09-10 ENCOUNTER — Encounter: Payer: Self-pay | Admitting: Family Medicine

## 2017-09-10 VITALS — BP 128/68 | HR 69 | Temp 98.2°F | Wt 196.0 lb

## 2017-09-10 DIAGNOSIS — G4700 Insomnia, unspecified: Secondary | ICD-10-CM

## 2017-09-10 DIAGNOSIS — F10288 Alcohol dependence with other alcohol-induced disorder: Secondary | ICD-10-CM

## 2017-09-10 DIAGNOSIS — F32A Depression, unspecified: Secondary | ICD-10-CM

## 2017-09-10 DIAGNOSIS — Z5181 Encounter for therapeutic drug level monitoring: Secondary | ICD-10-CM

## 2017-09-10 DIAGNOSIS — F329 Major depressive disorder, single episode, unspecified: Secondary | ICD-10-CM

## 2017-09-10 MED ORDER — CLONAZEPAM 0.5 MG PO TABS: 0.5000 mg | ORAL_TABLET | Freq: Every evening | ORAL | 1 refills | Status: DC | PRN

## 2017-09-10 NOTE — Assessment & Plan Note (Signed)
Endorses no more than 1 alcoholic drink per day.

## 2017-09-10 NOTE — Assessment & Plan Note (Signed)
Denies trouble with this.  

## 2017-09-10 NOTE — Assessment & Plan Note (Signed)
Longstanding sleep maintenance insomnia.  Reviewed sleep hygiene measures, handout provided.  Has tried and failed OTC (melatonin) and Rx medications (trazodone, hydroxyzine).  Invested in benzodiazepine. Reviewed risks of medication including but not limited to dependence, increased car accidents, increased fall and fracture risk, association with short term and long term memory loss, and increased mortality of this class of medications. He feels use benefits outweigh risks of medication. Reviewed controlled substance use, will fill out controlled substance agreement form as well as check UDS today.  Will Rx klonopin 0.5mg  QHS PRN insomnia #30/mo - reviewed sparing use PRN to only take if he's having trouble sleeping. Pt agrees with plan.

## 2017-09-10 NOTE — Patient Instructions (Addendum)
May take klonopin 0.5mg  at night as needed for sleep. Don't take regularly - only take as needed for sleep.  Update controlled substance agreement and urine drug screen today.  Make sure you're doing below recommendations to help with sleep.   Bedtime routine checklist: 1. Avoid naps during the day 2. Avoid stimulants such as caffeine and nicotine. Avoid bedtime alcohol (it can speed onset of sleep but the body's metabolism can cause awakenings). 3. All forms of exercise help ensure sound sleep - limit vigorous exercise to morning or late afternoon 4. Avoid food too close to bedtime including chocolate (which contains caffeine) 5. Soak up natural light 6. Establish regular bedtime routine. 7. Associate bed with sleep - avoid TV, computer or phone, reading while in bed. 8. Ensure pleasant, relaxing sleep environment - quiet, dark, cool room.

## 2017-09-10 NOTE — Progress Notes (Signed)
BP 128/68 (BP Location: Left Arm, Patient Position: Sitting, Cuff Size: Normal)   Pulse 69   Temp 98.2 F (36.8 C) (Oral)   Wt 196 lb (88.9 kg)   SpO2 100%   BMI 31.64 kg/m    CC: discuss meds Subjective:    Patient ID: Kevin ShipperRobert D Orosz, male    DOB: 11/11/1932, 84 y.o.   MRN: [id number]  HPI: [name] is a 84 y.o. male presenting on 09/10/2017 for Discuss medications (Requests rx for hydroxyzine)   Sleep initiation insomnia - longstanding. Bedtime is 9:30pm. No significant trouble with sleep maintenance. Nocturia x1. He does snore, sleeps on his stomach. Denies PNdyspnea or witnessed apnea. States he was previously tested for sleep apnea - and tested negative. Vivid dreams.  Pt desires to restart klonopin for insomnia. Was prior on klonopin long term but decided to come off of it middle of last year. Had done well until the beginning of this year when insomnia returned, unclear trigger.  Other medications tried: Trazodone did not help. Melatonin did not help. Hydroxyzine caused over sedation the next day and didn't help him sleep much.   Alcohol use h/o dependence - last visit (02/2017) he stated he was down to 1 glass of wine or beer at night. Today endorses no more than 1 alcoholic drink per day.   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   Relevant past medical, surgical, family and social history reviewed and updated as indicated. Interim medical history since our last visit reviewed. Allergies and medications reviewed and updated. Outpatient Medications Prior to Visit  Medication Sig Dispense Refill  . aspirin 81 MG tablet Take 81 mg by mouth daily.    . Cholecalciferol (D 1000) 1000 units CHEW Chew 1 tablet by mouth daily.    . Coenzyme Q10 (COQ-10) 50 MG CAPS Take by mouth daily.    . Flaxseed, Linseed, (FLAXSEED OIL) 1000 MG CAPS Take 1,200 mg by mouth daily.    . hydrOXYzine (ATARAX/VISTARIL) 25 MG tablet Take 1 tablet (25 mg  total) by mouth at bedtime as needed (sleep). 30 tablet 0  . metoprolol tartrate (LOPRESSOR) 25 MG tablet Take 0.5 tablets (12.5 mg total) by mouth 2 (two) times daily. 90 tablet 1  . Milk Thistle Extract 175 MG TABS Take 250 mg by mouth daily.    . Multiple Vitamin (MULTIVITAMIN) tablet Take 1 tablet by mouth daily.    . multivitamin-lutein (OCUVITE-LUTEIN) CAPS capsule Take 1 capsule by mouth daily.    . Omega-3 Fatty Acids (FISH OIL) 1000 MG CAPS Take 2,000 each by mouth daily.    . Potassium Aminobenzoate 500 MG TABS Take by mouth daily.    Marland Kitchen. triamterene-hydrochlorothiazide (MAXZIDE-25) 37.5-25 MG tablet TAKE 1 TABLET BY MOUTH EVERY OTHER DAY 45 tablet 3  . Zn-Pyg Afri-Nettle-Saw Palmet (SAW PALMETTO COMPLEX PO) Take 250 mg by mouth every other day.      No facility-administered medications prior to visit.      Per HPI unless specifically indicated in ROS section below Review of Systems     Objective:    BP 128/68 (BP Location: Left Arm, Patient Position: Sitting, Cuff Size: Normal)   Pulse 69   Temp 98.2 F (36.8 C) (Oral)   Wt 196 lb (88.9 kg)   SpO2 100%   BMI 31.64 kg/m   Wt Readings from Last 3 Encounters:  09/10/17 196 lb (88.9 kg)  03/12/17 187 lb 8 oz (85  kg)  02/21/17 189 lb 8 oz (86 kg)    Physical Exam  Constitutional: He appears well-developed and well-nourished. No distress.  Psychiatric: He has a normal mood and affect. His behavior is normal. Judgment and thought content normal.  Nursing note and vitals reviewed.      Assessment & Plan:  Over 25 minutes were spent face-to-face with the patient during this encounter and >50% of that time was spent on counseling and coordination of care  Problem List Items Addressed This Visit    Alcohol dependence (HCC)    Endorses no more than 1 alcoholic drink per day.       Depression    Denies trouble with this.       Insomnia - Primary    Longstanding sleep maintenance insomnia.  Reviewed sleep hygiene  measures, handout provided.  Has tried and failed OTC (melatonin) and Rx medications (trazodone, hydroxyzine).  Invested in benzodiazepine. Reviewed risks of medication including but not limited to dependence, increased car accidents, increased fall and fracture risk, association with short term and long term memory loss, and increased mortality of this class of medications. He feels use benefits outweigh risks of medication. Reviewed controlled substance use, will fill out controlled substance agreement form as well as check UDS today.  Will Rx klonopin 0.5mg  QHS PRN insomnia #30/mo - reviewed sparing use PRN to only take if he's having trouble sleeping. Pt agrees with plan.        Other Visit Diagnoses    Therapeutic drug monitoring       Relevant Orders   Pain Mgmt, Profile 8 w/Conf, U       Follow up plan: Return if symptoms worsen or fail to improve.  Eustaquio Boyden, MD

## 2017-09-11 ENCOUNTER — Telehealth: Payer: Self-pay | Admitting: *Deleted

## 2017-09-11 DIAGNOSIS — H04203 Unspecified epiphora, bilateral lacrimal glands: Secondary | ICD-10-CM | POA: Diagnosis not present

## 2017-09-11 NOTE — Telephone Encounter (Signed)
Copied from CRM [phone]. Topic: General - Other >> Sep 11, 2017  9:41 AM Darletta MollLander, Lumin L wrote: Reason for CRM: Patient would like a call back from Dr. Reece AgarG CMA. Would not disclose why.

## 2017-09-11 NOTE — Telephone Encounter (Signed)
  FYI Spoke to patient and was advised that [facility] Clonazepam because Dr. Reece AgarG did not approve the medication. Patient stated that he called the pharmacy and they told him his insurance would not cover it without Dr. Reece AgarG getting approval from them. Advised patient that we will contact the pharmacy and find out what they need?  [facility] and was advised that Medicare part D does not cover this type of medication at all any longer. Rosanne AshingJim stated that patient can get the medication for $13.56 and he will go ahead and get it ready for patient to pick up. Rosanne AshingJim stated that insurance will still not approve it if a PA is done.  Called and advised patient of conversation with Rosanne AshingJim (pharmacist) and told him it will cost him out of pocket $13.56 and patient was okay with that.

## 2017-09-15 LAB — PAIN MGMT, PROFILE 8 W/CONF, U
6 ACETYLMORPHINE: NEGATIVE ng/mL (ref <10)
ALPHAHYDROXYALPRAZOLAM: NEGATIVE ng/mL (ref <25)
ALPHAHYDROXYTRIAZOLAM: NEGATIVE ng/mL (ref <50)
AMPHETAMINES: NEGATIVE ng/mL (ref <500)
Alcohol Metabolites: POSITIVE ng/mL — AB (ref <500)
Alphahydroxymidazolam: NEGATIVE ng/mL (ref <50)
Aminoclonazepam: 55 ng/mL — ABNORMAL HIGH (ref <25)
BUPRENORPHINE, URINE: NEGATIVE ng/mL (ref <5)
Benzodiazepines: POSITIVE ng/mL — AB (ref <100)
COCAINE METABOLITE: NEGATIVE ng/mL (ref <150)
CREATININE: 131.9 mg/dL
Ethyl Glucuronide (ETG): 6109 ng/mL — ABNORMAL HIGH (ref <500)
Ethyl Sulfate (ETS): 1543 ng/mL — ABNORMAL HIGH (ref <100)
Hydroxyethylflurazepam: NEGATIVE ng/mL (ref <50)
LORAZEPAM: NEGATIVE ng/mL (ref <50)
MDMA: NEGATIVE ng/mL (ref <500)
Marijuana Metabolite: NEGATIVE ng/mL (ref <20)
NORDIAZEPAM: NEGATIVE ng/mL (ref <50)
Noroxycodone: 117 ng/mL — ABNORMAL HIGH (ref <50)
OPIATES: NEGATIVE ng/mL (ref <100)
OXAZEPAM: NEGATIVE ng/mL (ref <50)
OXIDANT: NEGATIVE ug/mL (ref <200)
OXYCODONE: NEGATIVE ng/mL (ref <50)
Oxycodone: POSITIVE ng/mL — AB (ref <100)
Oxymorphone: 150 ng/mL — ABNORMAL HIGH (ref <50)
PH: 7.18 (ref 4.5–9.0)
Temazepam: NEGATIVE ng/mL (ref <50)

## 2017-09-17 ENCOUNTER — Encounter: Payer: Self-pay | Admitting: Family Medicine

## 2017-09-17 DIAGNOSIS — R892 Abnormal level of other drugs, medicaments and biological substances in specimens from other organs, systems and tissues: Secondary | ICD-10-CM | POA: Insufficient documentation

## 2017-10-08 ENCOUNTER — Other Ambulatory Visit: Payer: Self-pay | Admitting: Family Medicine

## 2017-10-08 DIAGNOSIS — J189 Pneumonia, unspecified organism: Secondary | ICD-10-CM | POA: Diagnosis not present

## 2017-10-08 DIAGNOSIS — R509 Fever, unspecified: Secondary | ICD-10-CM | POA: Diagnosis not present

## 2017-10-08 DIAGNOSIS — J069 Acute upper respiratory infection, unspecified: Secondary | ICD-10-CM | POA: Diagnosis not present

## 2017-10-08 DIAGNOSIS — I10 Essential (primary) hypertension: Secondary | ICD-10-CM | POA: Diagnosis not present

## 2017-10-08 NOTE — Telephone Encounter (Signed)
Electronic refill request Last office visit 09/10/17 Medication is no longer on list.

## 2017-10-09 NOTE — Telephone Encounter (Signed)
Pt no longer taking.

## 2017-10-12 DIAGNOSIS — J189 Pneumonia, unspecified organism: Secondary | ICD-10-CM | POA: Diagnosis not present

## 2017-10-12 DIAGNOSIS — I10 Essential (primary) hypertension: Secondary | ICD-10-CM | POA: Diagnosis not present

## 2017-10-14 ENCOUNTER — Other Ambulatory Visit: Payer: Self-pay | Admitting: Family Medicine

## 2017-10-15 NOTE — Telephone Encounter (Signed)
I believe patient is no longer taking this.  He's taking klonopin in its place for sleep. See prior office visit.

## 2017-12-29 ENCOUNTER — Other Ambulatory Visit: Payer: Self-pay | Admitting: Family Medicine

## 2017-12-31 NOTE — Telephone Encounter (Signed)
Would call patient to clarify - I believe he is no longer taking trazodone but rather has been using klonopin sparingly for sleep. Would call to verify what he's actually taking.

## 2017-12-31 NOTE — Telephone Encounter (Signed)
Electronic refill request Last office visit 09/10/17 Medication is no longer on medication list

## 2018-01-01 NOTE — Telephone Encounter (Signed)
Spoke to patient and was advised that he is taking Trazodone and last filled it in May. Patient stated that he is not taking Klonopin. Patient stated that he did request the refill on the Trazodone.

## 2018-01-03 NOTE — Telephone Encounter (Signed)
Refilled

## 2018-01-16 ENCOUNTER — Other Ambulatory Visit: Payer: Self-pay | Admitting: Family Medicine

## 2018-02-18 DIAGNOSIS — H02132 Senile ectropion of right lower eyelid: Secondary | ICD-10-CM | POA: Diagnosis not present

## 2018-02-18 DIAGNOSIS — H26491 Other secondary cataract, right eye: Secondary | ICD-10-CM | POA: Diagnosis not present

## 2018-02-18 DIAGNOSIS — Z961 Presence of intraocular lens: Secondary | ICD-10-CM | POA: Diagnosis not present

## 2018-02-18 DIAGNOSIS — H02135 Senile ectropion of left lower eyelid: Secondary | ICD-10-CM | POA: Diagnosis not present

## 2018-02-19 DIAGNOSIS — H02132 Senile ectropion of right lower eyelid: Secondary | ICD-10-CM | POA: Diagnosis not present

## 2018-02-19 DIAGNOSIS — H02135 Senile ectropion of left lower eyelid: Secondary | ICD-10-CM | POA: Diagnosis not present

## 2018-02-27 ENCOUNTER — Other Ambulatory Visit: Payer: Medicare Other

## 2018-03-01 ENCOUNTER — Ambulatory Visit: Payer: Medicare Other | Admitting: Family Medicine

## 2018-03-01 ENCOUNTER — Ambulatory Visit: Payer: Medicare Other

## 2018-03-12 ENCOUNTER — Ambulatory Visit (INDEPENDENT_AMBULATORY_CARE_PROVIDER_SITE_OTHER): Payer: Medicare Other

## 2018-03-12 ENCOUNTER — Other Ambulatory Visit: Payer: Self-pay | Admitting: Family Medicine

## 2018-03-12 VITALS — BP 114/62 | HR 73 | Temp 97.6°F | Ht 66.25 in | Wt 189.0 lb

## 2018-03-12 DIAGNOSIS — Z0000 Encounter for general adult medical examination without abnormal findings: Secondary | ICD-10-CM | POA: Diagnosis not present

## 2018-03-12 DIAGNOSIS — F10288 Alcohol dependence with other alcohol-induced disorder: Secondary | ICD-10-CM | POA: Diagnosis not present

## 2018-03-12 DIAGNOSIS — D7589 Other specified diseases of blood and blood-forming organs: Secondary | ICD-10-CM

## 2018-03-12 DIAGNOSIS — I10 Essential (primary) hypertension: Secondary | ICD-10-CM

## 2018-03-12 LAB — COMPREHENSIVE METABOLIC PANEL
ALBUMIN: 4.1 g/dL (ref 3.5–5.2)
ALK PHOS: 46 U/L (ref 39–117)
ALT: 17 U/L (ref 0–53)
AST: 23 U/L (ref 0–37)
BILIRUBIN TOTAL: 0.6 mg/dL (ref 0.2–1.2)
BUN: 15 mg/dL (ref 6–23)
CO2: 31 mEq/L (ref 19–32)
Calcium: 10 mg/dL (ref 8.4–10.5)
Chloride: 101 mEq/L (ref 96–112)
Creatinine, Ser: 1.07 mg/dL (ref 0.40–1.50)
GFR: 70.18 mL/min (ref >60.00)
Glucose, Bld: 108 mg/dL — ABNORMAL HIGH (ref 70–99)
Potassium: 3.9 mEq/L (ref 3.5–5.1)
Sodium: 137 mEq/L (ref 135–145)
TOTAL PROTEIN: 7 g/dL (ref 6.0–8.3)

## 2018-03-12 LAB — CBC WITH DIFFERENTIAL/PLATELET
BASOS ABS: 0 10*3/uL (ref 0.0–0.1)
BASOS PCT: 0.6 % (ref 0.0–3.0)
EOS PCT: 6.3 % — AB (ref 0.0–5.0)
Eosinophils Absolute: 0.3 10*3/uL (ref 0.0–0.7)
HEMATOCRIT: 41.1 % (ref 39.0–52.0)
Hemoglobin: 13.9 g/dL (ref 13.0–17.0)
LYMPHS ABS: 1.9 10*3/uL (ref 0.7–4.0)
LYMPHS PCT: 39.9 % (ref 12.0–46.0)
MCHC: 33.7 g/dL (ref 30.0–36.0)
MCV: 106.7 fl — AB (ref 78.0–100.0)
Monocytes Absolute: 0.6 10*3/uL (ref 0.1–1.0)
Monocytes Relative: 13.4 % — ABNORMAL HIGH (ref 3.0–12.0)
NEUTROS ABS: 1.9 10*3/uL (ref 1.4–7.7)
NEUTROS PCT: 39.8 % — AB (ref 43.0–77.0)
PLATELETS: 182 10*3/uL (ref 150.0–400.0)
RBC: 3.85 Mil/uL — ABNORMAL LOW (ref 4.22–5.81)
RDW: 14 % (ref 11.5–15.5)
WBC: 4.8 10*3/uL (ref 4.0–10.5)

## 2018-03-12 LAB — MICROALBUMIN / CREATININE URINE RATIO
Creatinine,U: 82 mg/dL
Microalb Creat Ratio: 0.9 mg/g (ref 0.0–30.0)
Microalb, Ur: <0.7 mg/dL (ref 0.0–1.9)

## 2018-03-12 LAB — LIPID PANEL
CHOLESTEROL: 170 mg/dL (ref 0–200)
HDL: 82.8 mg/dL (ref >39.00)
LDL Cholesterol: 77 mg/dL (ref 0–99)
NONHDL: 87.22
Total CHOL/HDL Ratio: 2
Triglycerides: 52 mg/dL (ref 0.0–149.0)
VLDL: 10.4 mg/dL (ref 0.0–40.0)

## 2018-03-12 LAB — FOLATE

## 2018-03-12 NOTE — Patient Instructions (Signed)
Mr. Kevin BargesButler , Thank you for taking time to come for your Medicare Wellness Visit. I appreciate your ongoing commitment to your health goals. Please review the following plan we discussed and let me know if I can assist you in the future.   These are the goals we discussed: Goals      Patient Stated   . Increase physical activity (pt-stated)     Starting 03/12/2018, I will continue to walk at least 30 min 7 days per week.        This is a list of the screening recommended for you and due dates:  Health Maintenance  Topic Date Due  . Flu Shot  10/29/2018*  . DTaP/Tdap/Td vaccine (2 - Td) 12/12/2020  . Tetanus Vaccine  12/12/2020  . Pneumonia vaccines  Completed  *Topic was postponed. The date shown is not the original due date.   Preventive Care for Adults  A healthy lifestyle and preventive care can promote health and wellness. Preventive health guidelines for adults include the following key practices.  . A routine yearly physical is a good way to check with your health care provider about your health and preventive screening. It is a chance to share any concerns and updates on your health and to receive a thorough exam.  . Visit your dentist for a routine exam and preventive care every 6 months. Brush your teeth twice a day and floss once a day. Good oral hygiene prevents tooth decay and gum disease.  . The frequency of eye exams is based on your age, health, family medical history, use  of contact lenses, and other factors. Follow your health care provider's recommendations for frequency of eye exams.  . Eat a healthy diet. Foods like vegetables, fruits, whole grains, low-fat dairy products, and lean protein foods contain the nutrients you need without too many calories. Decrease your intake of foods high in solid fats, added sugars, and salt. Eat the right amount of calories for you. Get information about a proper diet from your health care provider, if necessary.  . Regular  physical exercise is one of the most important things you can do for your health. Most adults should get at least 150 minutes of moderate-intensity exercise (any activity that increases your heart rate and causes you to sweat) each week. In addition, most adults need muscle-strengthening exercises on 2 or more days a week.  Silver Sneakers may be a benefit available to you. To determine eligibility, you may visit the website: www.silversneakers.com or contact program at [phone])[phone] Mon-Fri between 8AM-8PM.   . Maintain a healthy weight. The body mass index (BMI) is a screening tool to identify possible weight problems. It provides an estimate of body fat based on height and weight. Your health care provider can find your BMI and can help you achieve or maintain a healthy weight.   For adults 20 years and older: ? A BMI below 18.5 is considered underweight. ? A BMI of 18.5 to 24.9 is normal. ? A BMI of 25 to 29.9 is considered overweight. ? A BMI of 30 and above is considered obese.   . Maintain normal blood lipids and cholesterol levels by exercising and minimizing your intake of saturated fat. Eat a balanced diet with plenty of fruit and vegetables. Blood tests for lipids and cholesterol should begin at age 82 and be repeated every 5 years. If your lipid or cholesterol levels are high, you are over 50, or you are at high risk for heart  disease, you may need your cholesterol levels checked more frequently. Ongoing high lipid and cholesterol levels should be treated with medicines if diet and exercise are not working.  . If you smoke, find out from your health care provider how to quit. If you do not use tobacco, please do not start.  . If you choose to drink alcohol, please do not consume more than 2 drinks per day. One drink is considered to be 12 ounces (355 mL) of beer, 5 ounces (148 mL) of wine, or 1.5 ounces (44 mL) of liquor.  . If you are 68-5 years old, ask your health care provider if  you should take aspirin to prevent strokes.  . Use sunscreen. Apply sunscreen liberally and repeatedly throughout the day. You should seek shade when your shadow is shorter than you. Protect yourself by wearing long sleeves, pants, a wide-brimmed hat, and sunglasses year round, whenever you are outdoors.  . Once a month, do a whole body skin exam, using a mirror to look at the skin on your back. Tell your health care provider of new moles, moles that have irregular borders, moles that are larger than a pencil eraser, or moles that have changed in shape or color.

## 2018-03-13 NOTE — Progress Notes (Signed)
Subjective:   Kevin ShipperRobert D Wolf is a 84 y.o. male who presents for Medicare Annual/Subsequent preventive examination.  Review of Systems:  N/A Cardiac Risk Factors include: advanced age (>3955men, 27>65 women);male gender;obesity (BMI >30kg/m2);hypertension     Objective:    Vitals: BP 114/62 (BP Location: Right Arm, Patient Position: Sitting, Cuff Size: Normal)   Pulse 73   Temp 97.6 F (36.4 C) (Oral)   Ht 5' 6.25" (1.683 m) Comment: shoes  Wt 189 lb (85.7 kg)   SpO2 98%   BMI 30.28 kg/m   Body mass index is 30.28 kg/m.  Advanced Directives 03/12/2018 02/21/2017 02/15/2016 02/15/2016 02/15/2016  Does Patient Have a Medical Advance Directive? Yes Yes Yes - Yes  Type of Advance Directive Healthcare Power of [place];Living will Healthcare Power of [place];Living will Healthcare Power of [place];Living will (No Data) Healthcare Power of [place];Living will  Does patient want to make changes to medical advance directive? - - No - Patient declined - No - Patient declined  Copy of Healthcare Power of Attorney in Chart? No - copy requested No - copy requested No - copy requested - No - copy requested    Tobacco Social History   Tobacco Use  Smoking Status Former Smoker  . Packs/day: 1.00  . Years: 50.00  . Pack years: 50.00  . Start date: 07/31/1948  . Last attempt to quit: 07/31/1998  . Years since quitting: 19.6  Smokeless Tobacco Never Used     Counseling given: No   Clinical Intake:  Pre-visit preparation completed: Yes  Pain : No/denies pain Pain Score: 0-No pain     Nutritional Status: BMI > 30  Obese Nutritional Risks: None Diabetes: No  How often do you need to have someone help you when you read instructions, pamphlets, or other written materials from your doctor or pharmacy?: 1 - Never What is the last grade level you completed in school?: Masters degree + post graduate courses  Interpreter Needed?: No  Comments: pt lives with spouse Information entered by  :: LPinson, LPN  Past Medical History:  Diagnosis Date  . Choledocholithiasis 2009   sepsis s/p ICU stay with VDRF  . Chronic fungal otitis externa 2015   [facility]([place])  . Colon polyp    adenomatous - [place]  . COPD (chronic obstructive pulmonary disease) (HCC)   . DDD (degenerative disc disease)    cervical and lumbar spine  . Depression with anxiety   . ED (erectile dysfunction)    on viagra  . Elevated PSA 2014   s/p normal biopsy 2003, now followed by Dr. Mena GoesEskridge, monitoring  . History of alcohol abuse 1989   after bad divorce  . History of smoking 2000   50+ PY hx, s/p normal CT chest 2014  . HTN (hypertension)   . OSA (obstructive sleep apnea)    could not tolerate CPAP  . Peyronie disease   . Presbycusis of both ears 2015   rec updated binaural amplification  . Wears hearing aid    [facility]   Past Surgical History:  Procedure Laterality Date  . CARDIAC CATHETERIZATION  2009   [place]  . CHOLECYSTECTOMY  2009  . COLONOSCOPY  2009   adenomatous polyp, rec rpt 5 yrs Evette Cristal([facility])  . COLONOSCOPY  07/2013   no polyps ([facility])  . hospitalization  2010   ERCP with sphincertotomy and CBD stone removal - septic cholangiis with EtOH withdrawal, E coli bacteremia with septic shock, with arterial thromboembolization with discoloration  of toes of R foot  . rectal fistula repair  1984  . TONSILLECTOMY  1941  . VASECTOMY  1969   Family History  Problem Relation Age of Onset  . Cancer Father        throat?  Marland Kitchen Alcohol abuse Father   . Stroke Maternal Aunt        hemorrhagic  . Stroke Mother        ischemic  . Diabetes Neg Hx    Social History   Socioeconomic History  . Marital status: Married    Spouse name: Not on file  . Number of children: Not on file  . Years of education: Not on file  . Highest education level: Not on file  Occupational History  . Not on file  Social Needs  . Financial resource strain: Not on file  . Food insecurity:     Worry: Not on file    Inability: Not on file  . Transportation needs:    Medical: Not on file    Non-medical: Not on file  Tobacco Use  . Smoking status: Former Smoker    Packs/day: 1.00    Years: 50.00    Pack years: 50.00    Start date: 07/31/1948    Last attempt to quit: 07/31/1998    Years since quitting: 19.6  . Smokeless tobacco: Never Used  Substance and Sexual Activity  . Alcohol use: Yes    Alcohol/week: 6.0 - 7.0 standard drinks    Types: 6 - 7 Glasses of wine per week  . Drug use: No  . Sexual activity: Yes  Lifestyle  . Physical activity:    Days per week: Not on file    Minutes per session: Not on file  . Stress: Not on file  Relationships  . Social connections:    Talks on phone: Not on file    Gets together: Not on file    Attends religious service: Not on file    Active member of club or organization: Not on file    Attends meetings of clubs or organizations: Not on file    Relationship status: Not on file  Other Topics Concern  . Not on file  Social History Narrative   Lives with [name] wife (married since 2005)   Occupation: Medical sales representative, retired   Edu: 18+ yrs   Activity: walking regularly, but not as much as he would like to - wife has trouble keeping up.   Diet: good water, fruits/vegetables daily. Significant sweet tea.    Outpatient Encounter Medications as of 03/12/2018  Medication Sig  . aspirin 81 MG tablet Take 81 mg by mouth every other day.   . clonazePAM (KLONOPIN) 0.5 MG tablet Take 1 tablet (0.5 mg total) by mouth at bedtime as needed (sleep).  . Coenzyme Q10 (COQ-10) 50 MG CAPS Take by mouth daily.  . Flaxseed, Linseed, (FLAXSEED OIL) 1000 MG CAPS Take 1,200 mg by mouth daily.  . metoprolol tartrate (LOPRESSOR) 25 MG tablet TAKE 1/2 TABLET TWICE A DAY  . Milk Thistle Extract 175 MG TABS Take 250 mg by mouth daily.  . Multiple Vitamin (MULTIVITAMIN) tablet Take 1 tablet by mouth daily.  . multivitamin-lutein (OCUVITE-LUTEIN) CAPS capsule  Take 1 capsule by mouth daily.  . Omega-3 Fatty Acids (FISH OIL) 1000 MG CAPS Take 2,000 each by mouth daily.  . Potassium Aminobenzoate 500 MG TABS Take by mouth daily.  Marland Kitchen triamterene-hydrochlorothiazide (MAXZIDE-25) 37.5-25 MG tablet TAKE 1 TABLET BY MOUTH EVERY OTHER DAY  .  Zn-Pyg Afri-Nettle-Saw Palmet (SAW PALMETTO COMPLEX PO) Take 250 mg by mouth every other day.   . [DISCONTINUED] Cholecalciferol (D 1000) 1000 units CHEW Chew 1 tablet by mouth daily.  . [DISCONTINUED] traZODone (DESYREL) 50 MG tablet TAKE 1/2 TO 1 TABLET AT BEDTIME AS NEEDED FOR SLEEP. (Patient not taking: Reported on 03/12/2018)   No facility-administered encounter medications on file as of 03/12/2018.     Activities of Daily Living In your present state of health, do you have any difficulty performing the following activities: 03/12/2018  Hearing? Y  Vision? N  Difficulty concentrating or making decisions? Y  Walking or climbing stairs? N  Dressing or bathing? N  Doing errands, shopping? N  Preparing Food and eating ? N  Using the Toilet? N  In the past six months, have you accidently leaked urine? N  Do you have problems with loss of bowel control? N  Managing your Medications? N  Managing your Finances? N  Housekeeping or managing your Housekeeping? N  Some recent data might be hidden    Patient Care Team: Eustaquio BoydenGutierrez, Javier, MD as PCP - General (Family Medicine) Oletha CruelLangdon, Charles Woodrow, DDS (Dentistry) Marden NobleEftekhari, Kian, MD (Ophthalmology)   Assessment:   This is a routine wellness examination for [name].  Hearing Screening Comments: Hearing aids Vision [facility]   Exercise Activities and Dietary recommendations Current Exercise Habits: Home exercise routine, Type of exercise: walking, Time (Minutes): 30(2 miles/daily), Frequency (Times/Week): 7, Weekly Exercise (Minutes/Week): 210, Intensity: Mild, Exercise limited by: None identified  Goals      Patient  Stated   . Increase physical activity (pt-stated)     Starting 03/12/2018, I will continue to walk at least 30 min 7 days per week.        Fall Risk Fall Risk  03/12/2018 02/21/2017 02/15/2016 02/15/2016 02/12/2015  Falls in the past year? No No No No No   Depression Screen PHQ 2/9 Scores 03/12/2018 02/21/2017 02/15/2016 02/15/2016  PHQ - 2 Score 0 1 0 0  PHQ- 9 Score 0 - - -    Cognitive Function MMSE - Mini Mental State Exam 03/12/2018 02/21/2017 02/15/2016  Orientation to time 5 5 5   Orientation to Place 5 5 5   Registration 3 3 3   Attention/ Calculation 0 0 0  Recall 3 2 3   Language- name 2 objects 0 0 0  Language- repeat 1 1 1   Language- follow 3 step command 3 3 3   Language- read & follow direction 0 0 0  Write a sentence 0 0 0  Copy design 0 0 0  Total score 20 19 20      PLEASE NOTE: A Mini-Cog screen was completed. Maximum score is 20. A value of 0 denotes this part of Folstein MMSE was not completed or the patient failed this part of the Mini-Cog screening.   Mini-Cog Screening Orientation to Time - Max 5 pts Orientation to Place - Max 5 pts Registration - Max 3 pts Recall - Max 3 pts Language Repeat - Max 1 pts Language Follow 3 Step Command - Max 3 pts     Immunization History  Administered Date(s) Administered  . Hepatitis A 04/14/2003, 10/07/2003  . Influenza Whole 04/14/2013  . Influenza, High Dose Seasonal PF 04/08/2014, 03/27/2015, 06/04/2017  . Influenza-Unspecified 03/20/2016  . Pneumococcal Conjugate-13 02/10/2014  . Pneumococcal Polysaccharide-23 08/06/2002  . Td 04/14/2003  . Tdap 12/13/2010  . Zoster 12/03/2006   Screening Tests Health Maintenance  Topic  Date Due  . INFLUENZA VACCINE  10/29/2018 (Originally 02/28/2018)  . DTaP/Tdap/Td (2 - Td) 12/12/2020  . TETANUS/TDAP  12/12/2020  . PNA vac Low Risk Adult  Completed     Plan:     I have personally reviewed, addressed, and noted the following in the patient's chart:  A. Medical and social  history B. Use of alcohol, tobacco or illicit drugs  C. Current medications and supplements D. Functional ability and status E.  Nutritional status F.  Physical activity G. Advance directives H. List of other physicians I.  Hospitalizations, surgeries, and ER visits in previous 12 months J.  Vitals K. Screenings to include hearing, vision, cognitive, depression L. Referrals and appointments - none  In addition, I have reviewed and discussed with patient certain preventive protocols, quality metrics, and best practice recommendations. A written personalized care plan for preventive services as well as general preventive health recommendations were provided to patient.  See attached scanned questionnaire for additional information.   Signed,   Randa Evens, MHA, BS, LPN Health Coach

## 2018-03-13 NOTE — Progress Notes (Signed)
PCP notes:   Health maintenance:  Flu vaccine - addressed  Abnormal screenings:   None  Patient concerns:   None  Nurse concerns:  None  Next PCP appt:   03/14/18 @ 0830

## 2018-03-14 ENCOUNTER — Ambulatory Visit (INDEPENDENT_AMBULATORY_CARE_PROVIDER_SITE_OTHER): Payer: Medicare Other | Admitting: Family Medicine

## 2018-03-14 ENCOUNTER — Encounter: Payer: Self-pay | Admitting: Family Medicine

## 2018-03-14 VITALS — BP 122/76 | HR 80 | Temp 98.2°F | Ht 66.25 in | Wt 186.8 lb

## 2018-03-14 DIAGNOSIS — D7589 Other specified diseases of blood and blood-forming organs: Secondary | ICD-10-CM | POA: Diagnosis not present

## 2018-03-14 DIAGNOSIS — F1020 Alcohol dependence, uncomplicated: Secondary | ICD-10-CM

## 2018-03-14 DIAGNOSIS — Z7189 Other specified counseling: Secondary | ICD-10-CM

## 2018-03-14 DIAGNOSIS — I10 Essential (primary) hypertension: Secondary | ICD-10-CM

## 2018-03-14 DIAGNOSIS — G4700 Insomnia, unspecified: Secondary | ICD-10-CM

## 2018-03-14 DIAGNOSIS — Z0000 Encounter for general adult medical examination without abnormal findings: Secondary | ICD-10-CM | POA: Diagnosis not present

## 2018-03-14 DIAGNOSIS — N509 Disorder of male genital organs, unspecified: Secondary | ICD-10-CM

## 2018-03-14 DIAGNOSIS — N5319 Other ejaculatory dysfunction: Secondary | ICD-10-CM

## 2018-03-14 MED ORDER — POTASSIUM 99 MG PO TABS: 1.0000 | ORAL_TABLET | ORAL | 0 refills | Status: DC

## 2018-03-14 MED ORDER — TRIAMTERENE-HCTZ 37.5-25 MG PO TABS
1.0000 | ORAL_TABLET | ORAL | 3 refills | Status: DC
Start: 2018-03-14 — End: 2018-07-17

## 2018-03-14 MED ORDER — VITAMIN K 100 MCG PO TABS: 100.0000 ug | ORAL_TABLET | Freq: Every day | ORAL | Status: DC

## 2018-03-14 MED ORDER — METOPROLOL TARTRATE 25 MG PO TABS: 12.5000 mg | ORAL_TABLET | Freq: Two times a day (BID) | ORAL | 3 refills | Status: DC

## 2018-03-14 MED ORDER — MILK THISTLE EXTRACT 175 MG PO TABS
250.0000 mg | ORAL_TABLET | ORAL | Status: DC
Start: 2018-03-15 — End: 2022-05-03

## 2018-03-14 NOTE — Assessment & Plan Note (Signed)
Chronic ?alcohol related. Had stable periph smear 2016.

## 2018-03-14 NOTE — Progress Notes (Signed)
BP 122/76 (BP Location: Left Arm, Patient Position: Sitting, Cuff Size: Normal)   Pulse 80   Temp 98.2 F (36.8 C) (Oral)   Ht 5' 6.25" (1.683 m)   Wt 186 lb 12 oz (84.7 kg)   SpO2 97%   BMI 29.92 kg/m    CC: CPE Subjective:    Patient ID: Kevin Shipperobert D Stolarz, male    DOB: 10/04/1932, 84 y.o.   MRN: [id number]  HPI: Kevin ShipperRobert D Wolf is a 84 y.o. male presenting on 03/14/2018 for Annual Exam (Pt 2.)   Saw Virl AxeLesia earlier this week for medicare wellness visit. Note reviewed.  Pt upset about wait time today.  Wants scrotal cyst evaluated - developed over last 6-8 months.   Notes worsening sexual dysfunction - trouble with both erection and ejaculation. Viagra previously caused headache. Would be interested in further treatment. More troubled with ejaculatory dysfunction than erection issues. Would like referral to urologist.  HTN - compliant with metoprolol and maxzide.   He has stopped klonopin since last month. Was using this for insomnia, but decided to stop this due to concern over side effects. Endorses vivid dreams.   Has had several eye surgeries over the last year. Persistent ectropion L eye. Has f/u next week with plastic surgery to re evaluate L eye.   Preventative: COLONOSCOPY Date: 07/2013 no polyps Evette Cristal([place])  Prostate - elevated PSA in past with normal biopsy per patient - this was followed by Dr. Mena GoesEskridge in past Q6 mo active surveillance. Has not returned since 01/2013. Does not want to return to uro. Latest labs had normalized Lung cancer screening - not eligible Flu shot - yearly Tetanus 2010  Pneumovax 07/2002. prevnar: 01/2014 Tdap 2012 zostavax 2008 Shingrix - discussed, declines Advanced directives: has this at home. Wife Ernie HewLinda Ellis is HCPOA. Declines bringing us a copy.  Seat belt use discussed Sunscreen use discussed. No changing moles on skin.  Ex-smoker - quit 2000 (50 PY hx) Alcohol - states he averages 1 drink a day Dentist - Q4 mo Eye exam - at least  yearly  Lives with Bonita QuinLinda wife (married since 2005) Occupation: Medical sales representativeceanographer, retired Edu: 18+ yrs Activity: walking 2 miles a day Diet: good water, fruits/vegetables daily. Following mediterranean diet. Drinks unsweet tea, avoid soft drinks.   Relevant past medical, surgical, family and social history reviewed and updated as indicated. Interim medical history since our last visit reviewed. Allergies and medications reviewed and updated. Outpatient Medications Prior to Visit  Medication Sig Dispense Refill  . aspirin 81 MG tablet Take 81 mg by mouth every other day.     . Coenzyme Q10 (COQ-10) 50 MG CAPS Take by mouth daily.    . Flaxseed, Linseed, (FLAXSEED OIL) 1000 MG CAPS Take 1,200 mg by mouth daily.    . Multiple Vitamin (MULTIVITAMIN) tablet Take 1 tablet by mouth daily.    . multivitamin-lutein (OCUVITE-LUTEIN) CAPS capsule Take 1 capsule by mouth daily.    . Omega-3 Fatty Acids (FISH OIL) 1000 MG CAPS Take 2,000 each by mouth daily.    Marland Kitchen. Zn-Pyg Afri-Nettle-Saw Palmet (SAW PALMETTO COMPLEX PO) Take 250 mg by mouth every other day.     . metoprolol tartrate (LOPRESSOR) 25 MG tablet TAKE 1/2 TABLET TWICE A DAY 90 tablet 0  . Milk Thistle Extract 175 MG TABS Take 250 mg by mouth daily.    . Potassium Aminobenzoate 500 MG TABS Take by mouth daily.    Marland Kitchen. triamterene-hydrochlorothiazide (MAXZIDE-25) 37.5-25 MG tablet TAKE 1 TABLET BY  MOUTH EVERY OTHER DAY 45 tablet 3  . clonazePAM (KLONOPIN) 0.5 MG tablet Take 1 tablet (0.5 mg total) by mouth at bedtime as needed (sleep). 30 tablet 1   No facility-administered medications prior to visit.      Per HPI unless specifically indicated in ROS section below Review of Systems  Constitutional: Negative for activity change, appetite change, chills, fatigue, fever and unexpected weight change.  HENT: Negative for hearing loss.   Eyes: Negative for visual disturbance.  Respiratory: Negative for cough, chest tightness, shortness of breath and  wheezing.   Cardiovascular: Negative for chest pain, palpitations and leg swelling.  Gastrointestinal: Positive for constipation (occasional). Negative for abdominal distention, abdominal pain, blood in stool, diarrhea, nausea and vomiting.  Genitourinary: Negative for difficulty urinating and hematuria.  Musculoskeletal: Negative for arthralgias, myalgias and neck pain.  Skin: Negative for rash.  Neurological: Negative for dizziness, seizures, syncope and headaches.  Hematological: Negative for adenopathy. Does not bruise/bleed easily.  Psychiatric/Behavioral: Negative for dysphoric mood. The patient is not nervous/anxious.        Objective:    BP 122/76 (BP Location: Left Arm, Patient Position: Sitting, Cuff Size: Normal)   Pulse 80   Temp 98.2 F (36.8 C) (Oral)   Ht 5' 6.25" (1.683 m)   Wt 186 lb 12 oz (84.7 kg)   SpO2 97%   BMI 29.92 kg/m   Wt Readings from Last 3 Encounters:  03/14/18 186 lb 12 oz (84.7 kg)  03/12/18 189 lb (85.7 kg)  09/10/17 196 lb (88.9 kg)    Physical Exam  Constitutional: He is oriented to person, place, and time. He appears well-developed and well-nourished. No distress.  HENT:  Head: Normocephalic and atraumatic.  Right Ear: Hearing, tympanic membrane, external ear and ear canal normal.  Left Ear: Hearing, tympanic membrane, external ear and ear canal normal.  Nose: Nose normal.  Mouth/Throat: Uvula is midline, oropharynx is clear and moist and mucous membranes are normal. No oropharyngeal exudate, posterior oropharyngeal edema or posterior oropharyngeal erythema.  Cerumen covering R TM  Eyes: Pupils are equal, round, and reactive to light. Conjunctivae and EOM are normal. No scleral icterus.  Neck: Normal range of motion. Neck supple. Carotid bruit is not present. No thyromegaly present.  Cardiovascular: Normal rate, regular rhythm, normal heart sounds and intact distal pulses.  No murmur heard. Pulses:      Radial pulses are 2+ on the right  side, and 2+ on the left side.  Pulmonary/Chest: Effort normal and breath sounds normal. No respiratory distress. He has no wheezes. He has no rales.  Abdominal: Soft. Bowel sounds are normal. He exhibits no distension and no mass. There is no tenderness. There is no rebound and no guarding.  Genitourinary: Testes normal and penis normal. Right testis shows no mass, no swelling and no tenderness. Right testis is descended. Left testis shows no mass, no swelling and no tenderness. Left testis is descended.  Genitourinary Comments: Small superficial sebaceous cyst at perineum L of scrotum   Musculoskeletal: Normal range of motion. He exhibits no edema.  Lymphadenopathy:    He has no cervical adenopathy.  Neurological: He is alert and oriented to person, place, and time.  CN grossly intact, station and gait intact  Skin: Skin is warm and dry. No rash noted.  Psychiatric: He has a normal mood and affect. His behavior is normal. Judgment and thought content normal.  Nursing note and vitals reviewed.  Results for orders placed or performed in visit on  03/12/18  CBC with Differential/Platelet  Result Value Ref Range   WBC 4.8 4.0 - 10.5 K/uL   RBC 3.85 (L) 4.22 - 5.81 Mil/uL   Hemoglobin 13.9 13.0 - 17.0 g/dL   HCT 46.9 62.9 - 52.8 %   MCV 106.7 (H) 78.0 - 100.0 fl   MCHC 33.7 30.0 - 36.0 g/dL   RDW 41.3 24.4 - 01.0 %   Platelets 182.0 150.0 - 400.0 K/uL   Neutrophils Relative % 39.8 (L) 43.0 - 77.0 %   Lymphocytes Relative 39.9 12.0 - 46.0 %   Monocytes Relative 13.4 (H) 3.0 - 12.0 %   Eosinophils Relative 6.3 (H) 0.0 - 5.0 %   Basophils Relative 0.6 0.0 - 3.0 %   Neutro Abs 1.9 1.4 - 7.7 K/uL   Lymphs Abs 1.9 0.7 - 4.0 K/uL   Monocytes Absolute 0.6 0.1 - 1.0 K/uL   Eosinophils Absolute 0.3 0.0 - 0.7 K/uL   Basophils Absolute 0.0 0.0 - 0.1 K/uL  Microalbumin / creatinine urine ratio  Result Value Ref Range   Microalb, Ur <0.7 0.0 - 1.9 mg/dL   Creatinine,U 27.2 mg/dL   Microalb Creat  Ratio 0.9 0.0 - 30.0 mg/g  Comprehensive metabolic panel  Result Value Ref Range   Sodium 137 135 - 145 mEq/L   Potassium 3.9 3.5 - 5.1 mEq/L   Chloride 101 96 - 112 mEq/L   CO2 31 19 - 32 mEq/L   Glucose, Bld 108 (H) 70 - 99 mg/dL   BUN 15 6 - 23 mg/dL   Creatinine, Ser 5.36 0.40 - 1.50 mg/dL   Total Bilirubin 0.6 0.2 - 1.2 mg/dL   Alkaline Phosphatase 46 39 - 117 U/L   AST 23 0 - 37 U/L   ALT 17 0 - 53 U/L   Total Protein 7.0 6.0 - 8.3 g/dL   Albumin 4.1 3.5 - 5.2 g/dL   Calcium 64.4 8.4 - 03.4 mg/dL   GFR 74.25 >95.63 mL/min  Lipid panel  Result Value Ref Range   Cholesterol 170 0 - 200 mg/dL   Triglycerides 87.5 0.0 - 149.0 mg/dL   HDL 64.33 >29.51 mg/dL   VLDL 88.4 0.0 - 16.6 mg/dL   LDL Cholesterol 77 0 - 99 mg/dL   Total CHOL/HDL Ratio 2    NonHDL 87.22   Folate  Result Value Ref Range   Folate >24.1 >5.9 ng/mL   Lab Results  Component Value Date   VITAMINB12 594 02/09/2016    Lab Results  Component Value Date   PSA 2.87 02/19/2017   PSA 1.09 02/09/2016   PSA 1.26 02/08/2015       Assessment & Plan:   Problem List Items Addressed This Visit    Scrotal skin lesion    Looks like benign small perineal sebaceous cyst, not involving scrotum. Reassured patient.      Macrocytosis without anemia    Chronic ?alcohol related. Had stable periph smear 2016.       Insomnia    longterm managed on klonopin, has decided to stop this. Encouraged limited alcohol use.       HTN (hypertension)    Chronic, stable. Continue current regimen.       Relevant Medications   triamterene-hydrochlorothiazide (MAXZIDE-25) 37.5-25 MG tablet   metoprolol tartrate (LOPRESSOR) 25 MG tablet   Health maintenance examination - Primary    Preventative protocols reviewed and updated unless pt declined. Discussed healthy diet and lifestyle.       Ejaculatory disorder  Will refer to urology for both erectile and ejaculatory dysfunction per patient request.       Relevant  Orders   Ambulatory referral to Urology   Alcohol dependence (HCC)    Currently limiting alcohol to 1 drink a day.       Advanced care planning/counseling discussion    Advanced directives: has this at home. Wife Ernie HewLinda Ellis is HCPOA.           Meds ordered this encounter  Medications  . triamterene-hydrochlorothiazide (MAXZIDE-25) 37.5-25 MG tablet    Sig: Take 1 tablet by mouth every other day.    Dispense:  45 tablet    Refill:  3  . metoprolol tartrate (LOPRESSOR) 25 MG tablet    Sig: Take 0.5 tablets (12.5 mg total) by mouth 2 (two) times daily.    Dispense:  90 tablet    Refill:  3  . Potassium 99 MG TABS    Sig: Take 1 tablet (99 mg total) by mouth 3 (three) times a week.    Dispense:  330 each    Refill:  0  . Milk Thistle Extract 175 MG TABS    Sig: Take 1.4286 tablets (250 mg total) by mouth 3 (three) times a week.  . vitamin k 100 MCG tablet    Sig: Take 1 tablet (100 mcg total) by mouth daily.   Orders Placed This Encounter  Procedures  . Ambulatory referral to Urology    Referral Priority:   Routine    Referral Type:   Consultation    Referral Reason:   Specialty Services Required    Requested Specialty:   Urology    Number of Visits Requested:   1    Follow up plan: Return in about 1 year (around 03/15/2019) for annual exam, prior fasting for blood work, medicare wellness visit.  Eustaquio BoydenJavier Vasily Fedewa, MD

## 2018-03-14 NOTE — Assessment & Plan Note (Signed)
Preventative protocols reviewed and updated unless pt declined. Discussed healthy diet and lifestyle.  

## 2018-03-14 NOTE — Patient Instructions (Addendum)
We will refer you to urologist for further evaluation.  You have a benign sebaceous cyst past left scrotum You are doing well today. Labs were looking good Return as needed or in 1 year for next physical  Health Maintenance, Male A healthy lifestyle and preventive care is important for your health and wellness. Ask your health care provider about what schedule of regular examinations is right for you. What should I know about weight and diet? Eat a Healthy Diet  Eat plenty of vegetables, fruits, whole grains, low-fat dairy products, and lean protein.  Do not eat a lot of foods high in solid fats, added sugars, or salt.  Maintain a Healthy Weight Regular exercise can help you achieve or maintain a healthy weight. You should:  Do at least 150 minutes of exercise each week. The exercise should increase your heart rate and make you sweat (moderate-intensity exercise).  Do strength-training exercises at least twice a week.  Watch Your Levels of Cholesterol and Blood Lipids  Have your blood tested for lipids and cholesterol every 5 years starting at 82 years of age. If you are at high risk for heart disease, you should start having your blood tested when you are 82 years old. You may need to have your cholesterol levels checked more often if: ? Your lipid or cholesterol levels are high. ? You are older than 82 years of age. ? You are at high risk for heart disease.  What should I know about cancer screening? Many types of cancers can be detected early and may often be prevented. Lung Cancer  You should be screened every year for lung cancer if: ? You are a current smoker who has smoked for at least 30 years. ? You are a former smoker who has quit within the past 15 years.  Talk to your health care provider about your screening options, when you should start screening, and how often you should be screened.  Colorectal Cancer  Routine colorectal cancer screening usually begins at 6550  years of age and should be repeated every 5-10 years until you are 82 years old. You may need to be screened more often if early forms of precancerous polyps or small growths are found. Your health care provider may recommend screening at an earlier age if you have risk factors for colon cancer.  Your health care provider may recommend using home test kits to check for hidden blood in the stool.  A small camera at the end of a tube can be used to examine your colon (sigmoidoscopy or colonoscopy). This checks for the earliest forms of colorectal cancer.  Prostate and Testicular Cancer  Depending on your age and overall health, your health care provider may do certain tests to screen for prostate and testicular cancer.  Talk to your health care provider about any symptoms or concerns you have about testicular or prostate cancer.  Skin Cancer  Check your skin from head to toe regularly.  Tell your health care provider about any new moles or changes in moles, especially if: ? There is a change in a mole's size, shape, or color. ? You have a mole that is larger than a pencil eraser.  Always use sunscreen. Apply sunscreen liberally and repeat throughout the day.  Protect yourself by wearing long sleeves, pants, a wide-brimmed hat, and sunglasses when outside.  What should I know about heart disease, diabetes, and high blood pressure?  If you are 6718-82 years of age, have your blood pressure  checked every 3-5 years. If you are 55 years of age or older, have your blood pressure checked every year. You should have your blood pressure measured twice-once when you are at a hospital or clinic, and once when you are not at a hospital or clinic. Record the average of the two measurements. To check your blood pressure when you are not at a hospital or clinic, you can use: ? An automated blood pressure machine at a pharmacy. ? A home blood pressure monitor.  Talk to your health care provider about your  target blood pressure.  If you are between 4-24 years old, ask your health care provider if you should take aspirin to prevent heart disease.  Have regular diabetes screenings by checking your fasting blood sugar level. ? If you are at a normal weight and have a low risk for diabetes, have this test once every three years after the age of 98. ? If you are overweight and have a high risk for diabetes, consider being tested at a younger age or more often.  A one-time screening for abdominal aortic aneurysm (AAA) by ultrasound is recommended for men aged 72-75 years who are current or former smokers. What should I know about preventing infection? Hepatitis B If you have a higher risk for hepatitis B, you should be screened for this virus. Talk with your health care provider to find out if you are at risk for hepatitis B infection. Hepatitis C Blood testing is recommended for:  Everyone born from [birth year] through 1963.  Anyone with known risk factors for hepatitis C.  Sexually Transmitted Diseases (STDs)  You should be screened each year for STDs including gonorrhea and chlamydia if: ? You are sexually active and are younger than 82 years of age. ? You are older than 82 years of age and your health care provider tells you that you are at risk for this type of infection. ? Your sexual activity has changed since you were last screened and you are at an increased risk for chlamydia or gonorrhea. Ask your health care provider if you are at risk.  Talk with your health care provider about whether you are at high risk of being infected with HIV. Your health care provider may recommend a prescription medicine to help prevent HIV infection.  What else can I do?  Schedule regular health, dental, and eye exams.  Stay current with your vaccines (immunizations).  Do not use any tobacco products, such as cigarettes, chewing tobacco, and e-cigarettes. If you need help quitting, ask your health care  provider.  Limit alcohol intake to no more than 2 drinks per day. One drink equals 12 ounces of beer, 5 ounces of wine, or 1 ounces of hard liquor.  Do not use street drugs.  Do not share needles.  Ask your health care provider for help if you need support or information about quitting drugs.  Tell your health care provider if you often feel depressed.  Tell your health care provider if you have ever been abused or do not feel safe at home. This information is not intended to replace advice given to you by your health care provider. Make sure you discuss any questions you have with your health care provider. Document Released: 01/13/2008 Document Revised: 03/15/2016 Document Reviewed: 04/20/2015 Elsevier Interactive Patient Education  [employer].

## 2018-03-17 DIAGNOSIS — N509 Disorder of male genital organs, unspecified: Secondary | ICD-10-CM | POA: Insufficient documentation

## 2018-03-17 DIAGNOSIS — N5319 Other ejaculatory dysfunction: Secondary | ICD-10-CM | POA: Insufficient documentation

## 2018-03-17 NOTE — Assessment & Plan Note (Addendum)
Chronic, stable. Continue current regimen. 

## 2018-03-17 NOTE — Assessment & Plan Note (Signed)
Looks like benign small perineal sebaceous cyst, not involving scrotum. Reassured patient.

## 2018-03-17 NOTE — Assessment & Plan Note (Signed)
Advanced directives: has this at home. Wife Ernie HewLinda Wolf is HCPOA.

## 2018-03-17 NOTE — Assessment & Plan Note (Addendum)
Currently limiting alcohol to 1 drink a day.

## 2018-03-17 NOTE — Assessment & Plan Note (Signed)
longterm managed on klonopin, has decided to stop this. Encouraged limited alcohol use.

## 2018-03-17 NOTE — Assessment & Plan Note (Signed)
Will refer to urology for both erectile and ejaculatory dysfunction per patient request.

## 2018-03-18 DIAGNOSIS — H02132 Senile ectropion of right lower eyelid: Secondary | ICD-10-CM | POA: Diagnosis not present

## 2018-03-18 DIAGNOSIS — H02135 Senile ectropion of left lower eyelid: Secondary | ICD-10-CM | POA: Diagnosis not present

## 2018-03-18 DIAGNOSIS — H0289 Other specified disorders of eyelid: Secondary | ICD-10-CM | POA: Diagnosis not present

## 2018-03-24 NOTE — Progress Notes (Signed)
I reviewed health advisor's note, was available for consultation, and agree with documentation and plan.  

## 2018-04-16 ENCOUNTER — Ambulatory Visit: Payer: Medicare Other

## 2018-04-16 ENCOUNTER — Ambulatory Visit (INDEPENDENT_AMBULATORY_CARE_PROVIDER_SITE_OTHER): Payer: Medicare Other

## 2018-04-16 DIAGNOSIS — Z23 Encounter for immunization: Secondary | ICD-10-CM | POA: Diagnosis not present

## 2018-04-24 DIAGNOSIS — N5201 Erectile dysfunction due to arterial insufficiency: Secondary | ICD-10-CM | POA: Diagnosis not present

## 2018-04-24 DIAGNOSIS — R6882 Decreased libido: Secondary | ICD-10-CM | POA: Diagnosis not present

## 2018-04-24 DIAGNOSIS — R8271 Bacteriuria: Secondary | ICD-10-CM | POA: Diagnosis not present

## 2018-04-30 HISTORY — PX: ECTROPION REPAIR: SHX357

## 2018-05-01 DIAGNOSIS — R6882 Decreased libido: Secondary | ICD-10-CM | POA: Diagnosis not present

## 2018-05-22 DIAGNOSIS — H02109 Unspecified ectropion of unspecified eye, unspecified eyelid: Secondary | ICD-10-CM | POA: Diagnosis not present

## 2018-05-22 DIAGNOSIS — I10 Essential (primary) hypertension: Secondary | ICD-10-CM | POA: Diagnosis not present

## 2018-05-22 DIAGNOSIS — J449 Chronic obstructive pulmonary disease, unspecified: Secondary | ICD-10-CM | POA: Diagnosis not present

## 2018-05-24 DIAGNOSIS — Z87891 Personal history of nicotine dependence: Secondary | ICD-10-CM | POA: Diagnosis not present

## 2018-05-24 DIAGNOSIS — H02135 Senile ectropion of left lower eyelid: Secondary | ICD-10-CM | POA: Diagnosis not present

## 2018-05-24 DIAGNOSIS — H02132 Senile ectropion of right lower eyelid: Secondary | ICD-10-CM | POA: Diagnosis not present

## 2018-05-24 DIAGNOSIS — Z7982 Long term (current) use of aspirin: Secondary | ICD-10-CM | POA: Diagnosis not present

## 2018-05-24 DIAGNOSIS — J449 Chronic obstructive pulmonary disease, unspecified: Secondary | ICD-10-CM | POA: Diagnosis not present

## 2018-05-24 DIAGNOSIS — Z9841 Cataract extraction status, right eye: Secondary | ICD-10-CM | POA: Diagnosis not present

## 2018-05-24 DIAGNOSIS — Z9049 Acquired absence of other specified parts of digestive tract: Secondary | ICD-10-CM | POA: Diagnosis not present

## 2018-05-24 DIAGNOSIS — I10 Essential (primary) hypertension: Secondary | ICD-10-CM | POA: Diagnosis not present

## 2018-07-03 ENCOUNTER — Other Ambulatory Visit: Payer: Self-pay | Admitting: Family Medicine

## 2018-07-03 NOTE — Telephone Encounter (Signed)
Electronic refill request for Trazodone Last office visit 03/14/18 Medication is no longer on the med list

## 2018-07-17 ENCOUNTER — Other Ambulatory Visit: Payer: Self-pay | Admitting: Family Medicine

## 2018-10-07 DIAGNOSIS — Z961 Presence of intraocular lens: Secondary | ICD-10-CM | POA: Diagnosis not present

## 2018-10-07 DIAGNOSIS — H26491 Other secondary cataract, right eye: Secondary | ICD-10-CM | POA: Diagnosis not present

## 2018-10-07 DIAGNOSIS — Z9889 Other specified postprocedural states: Secondary | ICD-10-CM | POA: Diagnosis not present

## 2019-01-07 DIAGNOSIS — H903 Sensorineural hearing loss, bilateral: Secondary | ICD-10-CM | POA: Diagnosis not present

## 2019-01-07 DIAGNOSIS — H6123 Impacted cerumen, bilateral: Secondary | ICD-10-CM | POA: Diagnosis not present

## 2019-02-04 ENCOUNTER — Encounter: Payer: Self-pay | Admitting: Family Medicine

## 2019-03-01 HISTORY — PX: REFRACTIVE SURGERY: SHX103

## 2019-03-10 DIAGNOSIS — H26491 Other secondary cataract, right eye: Secondary | ICD-10-CM | POA: Diagnosis not present

## 2019-03-12 DIAGNOSIS — H524 Presbyopia: Secondary | ICD-10-CM | POA: Diagnosis not present

## 2019-03-19 ENCOUNTER — Other Ambulatory Visit: Payer: Self-pay

## 2019-03-19 ENCOUNTER — Ambulatory Visit: Payer: Medicare Other

## 2019-03-19 ENCOUNTER — Ambulatory Visit (INDEPENDENT_AMBULATORY_CARE_PROVIDER_SITE_OTHER): Payer: Medicare Other | Admitting: Family Medicine

## 2019-03-19 ENCOUNTER — Encounter: Payer: Self-pay | Admitting: Family Medicine

## 2019-03-19 VITALS — BP 118/64 | HR 70 | Temp 98.5°F | Ht 65.0 in | Wt 176.0 lb

## 2019-03-19 DIAGNOSIS — Z0000 Encounter for general adult medical examination without abnormal findings: Secondary | ICD-10-CM | POA: Diagnosis not present

## 2019-03-19 DIAGNOSIS — R413 Other amnesia: Secondary | ICD-10-CM | POA: Insufficient documentation

## 2019-03-19 DIAGNOSIS — Z23 Encounter for immunization: Secondary | ICD-10-CM

## 2019-03-19 DIAGNOSIS — D7589 Other specified diseases of blood and blood-forming organs: Secondary | ICD-10-CM | POA: Diagnosis not present

## 2019-03-19 DIAGNOSIS — Z7189 Other specified counseling: Secondary | ICD-10-CM

## 2019-03-19 DIAGNOSIS — G4700 Insomnia, unspecified: Secondary | ICD-10-CM

## 2019-03-19 DIAGNOSIS — I10 Essential (primary) hypertension: Secondary | ICD-10-CM | POA: Diagnosis not present

## 2019-03-19 DIAGNOSIS — J449 Chronic obstructive pulmonary disease, unspecified: Secondary | ICD-10-CM

## 2019-03-19 DIAGNOSIS — F1020 Alcohol dependence, uncomplicated: Secondary | ICD-10-CM

## 2019-03-19 LAB — CBC WITH DIFFERENTIAL/PLATELET
Basophils Absolute: 0 10*3/uL (ref 0.0–0.1)
Basophils Relative: 0.6 % (ref 0.0–3.0)
Eosinophils Absolute: 0.1 10*3/uL (ref 0.0–0.7)
Eosinophils Relative: 1.8 % (ref 0.0–5.0)
HCT: 38.4 % — ABNORMAL LOW (ref 39.0–52.0)
Hemoglobin: 13 g/dL (ref 13.0–17.0)
Lymphocytes Relative: 30.6 % (ref 12.0–46.0)
Lymphs Abs: 1.5 10*3/uL (ref 0.7–4.0)
MCHC: 34 g/dL (ref 30.0–36.0)
MCV: 109.5 fl — ABNORMAL HIGH (ref 78.0–100.0)
Monocytes Absolute: 0.7 10*3/uL (ref 0.1–1.0)
Monocytes Relative: 13.5 % — ABNORMAL HIGH (ref 3.0–12.0)
Neutro Abs: 2.7 10*3/uL (ref 1.4–7.7)
Neutrophils Relative %: 53.5 % (ref 43.0–77.0)
Platelets: 176 10*3/uL (ref 150.0–400.0)
RBC: 3.51 Mil/uL — ABNORMAL LOW (ref 4.22–5.81)
RDW: 14 % (ref 11.5–15.5)
WBC: 5 10*3/uL (ref 4.0–10.5)

## 2019-03-19 LAB — COMPREHENSIVE METABOLIC PANEL
ALT: 17 U/L (ref 0–53)
AST: 24 U/L (ref 0–37)
Albumin: 4.4 g/dL (ref 3.5–5.2)
Alkaline Phosphatase: 58 U/L (ref 39–117)
BUN: 16 mg/dL (ref 6–23)
CO2: 30 mEq/L (ref 19–32)
Calcium: 10.6 mg/dL — ABNORMAL HIGH (ref 8.4–10.5)
Chloride: 101 mEq/L (ref 96–112)
Creatinine, Ser: 1.02 mg/dL (ref 0.40–1.50)
GFR: 69.6 mL/min (ref >60.00)
Glucose, Bld: 110 mg/dL — ABNORMAL HIGH (ref 70–99)
Potassium: 4.2 mEq/L (ref 3.5–5.1)
Sodium: 138 mEq/L (ref 135–145)
Total Bilirubin: 0.6 mg/dL (ref 0.2–1.2)
Total Protein: 6.9 g/dL (ref 6.0–8.3)

## 2019-03-19 LAB — LIPID PANEL
Cholesterol: 182 mg/dL (ref 0–200)
HDL: 99.1 mg/dL (ref >39.00)
LDL Cholesterol: 72 mg/dL (ref 0–99)
NonHDL: 82.94
Total CHOL/HDL Ratio: 2
Triglycerides: 55 mg/dL (ref 0.0–149.0)
VLDL: 11 mg/dL (ref 0.0–40.0)

## 2019-03-19 LAB — VITAMIN B12: Vitamin B-12: 1310 pg/mL — ABNORMAL HIGH (ref 211–911)

## 2019-03-19 MED ORDER — ALEVE PM 220-25 MG PO TABS: 1.0000 | ORAL_TABLET | Freq: Every evening | ORAL | Status: DC | PRN

## 2019-03-19 MED ORDER — TRIAMTERENE-HCTZ 37.5-25 MG PO TABS: 1.0000 | ORAL_TABLET | ORAL | 3 refills | Status: DC

## 2019-03-19 MED ORDER — METOPROLOL TARTRATE 25 MG PO TABS: 12.5000 mg | ORAL_TABLET | Freq: Two times a day (BID) | ORAL | 3 refills | Status: DC

## 2019-03-19 MED ORDER — VITAMIN D3 25 MCG (1000 UT) PO CAPS: 1.0000 | ORAL_CAPSULE | Freq: Every day | ORAL | Status: DC

## 2019-03-19 NOTE — Assessment & Plan Note (Signed)
Advanced directives: has this at home. Wife Christin Fudge is HCPOA.Declines bringing [place] a copy.

## 2019-03-19 NOTE — Assessment & Plan Note (Signed)

## 2019-03-19 NOTE — Assessment & Plan Note (Signed)
Preventative protocols reviewed and updated unless pt declined. Discussed healthy diet and lifestyle.  

## 2019-03-19 NOTE — Assessment & Plan Note (Signed)
Some trouble with recall today. Encouraged continued physical activity, social engagement, encouraged reading and word puzzles to keep memory active. Update if worsening memory trouble noted by pt or wife.

## 2019-03-19 NOTE — Assessment & Plan Note (Signed)
Asxs. Not on respiratory medication.

## 2019-03-19 NOTE — Patient Instructions (Addendum)
Labs today. Flu shot today.  Let's try off aspirin.  Start vitamin b12.  Congratulations with stopping drinking! Keep it up.  Return as needed or in 1 year for next wellness visit.   Health Maintenance After Age 83 After age 88, you are at a higher risk for certain long-term diseases and infections as well as injuries from falls. Falls are a major cause of broken bones and head injuries in people who are older than age 21. Getting regular preventive care can help to keep you healthy and well. Preventive care includes getting regular testing and making lifestyle changes as recommended by your health care provider. Talk with your health care provider about:  Which screenings and tests you should have. A screening is a test that checks for a disease when you have no symptoms.  A diet and exercise plan that is right for you. What should I know about screenings and tests to prevent falls? Screening and testing are the best ways to find a health problem early. Early diagnosis and treatment give you the best chance of managing medical conditions that are common after age 22. Certain conditions and lifestyle choices may make you more likely to have a fall. Your health care provider may recommend:  Regular vision checks. Poor vision and conditions such as cataracts can make you more likely to have a fall. If you wear glasses, make sure to get your prescription updated if your vision changes.  Medicine review. Work with your health care provider to regularly review all of the medicines you are taking, including over-the-counter medicines. Ask your health care provider about any side effects that may make you more likely to have a fall. Tell your health care provider if any medicines that you take make you feel dizzy or sleepy.  Osteoporosis screening. Osteoporosis is a condition that causes the bones to get weaker. This can make the bones weak and cause them to break more easily.  Blood pressure  screening. Blood pressure changes and medicines to control blood pressure can make you feel dizzy.  Strength and balance checks. Your health care provider may recommend certain tests to check your strength and balance while standing, walking, or changing positions.  Foot health exam. Foot pain and numbness, as well as not wearing proper footwear, can make you more likely to have a fall.  Depression screening. You may be more likely to have a fall if you have a fear of falling, feel emotionally low, or feel unable to do activities that you used to do.  Alcohol use screening. Using too much alcohol can affect your balance and may make you more likely to have a fall. What actions can I take to lower my risk of falls? General instructions  Talk with your health care provider about your risks for falling. Tell your health care provider if: ? You fall. Be sure to tell your health care provider about all falls, even ones that seem minor. ? You feel dizzy, sleepy, or off-balance.  Take over-the-counter and prescription medicines only as told by your health care provider. These include any supplements.  Eat a healthy diet and maintain a healthy weight. A healthy diet includes low-fat dairy products, low-fat (lean) meats, and fiber from whole grains, beans, and lots of fruits and vegetables. Home safety  Remove any tripping hazards, such as rugs, cords, and clutter.  Install safety equipment such as grab bars in bathrooms and safety rails on stairs.  Keep rooms and walkways well-lit. Activity  Follow a regular exercise program to stay fit. This will help you maintain your balance. Ask your health care provider what types of exercise are appropriate for you.  If you need a cane or walker, use it as recommended by your health care provider.  Wear supportive shoes that have nonskid soles. Lifestyle  Do not drink alcohol if your health care provider tells you not to drink.  If you drink  alcohol, limit how much you have: ? 0-1 drink a day for women. ? 0-2 drinks a day for men.  Be aware of how much alcohol is in your drink. In the [place] one drink equals one typical bottle of beer (12 oz), one-half glass of wine (5 oz), or one shot of hard liquor (1 oz).  Do not use any products that contain nicotine or tobacco, such as cigarettes and e-cigarettes. If you need help quitting, ask your health care provider. Summary  Having a healthy lifestyle and getting preventive care can help to protect your health and wellness after age 55.  Screening and testing are the best way to find a health problem early and help you avoid having a fall. Early diagnosis and treatment give you the best chance for managing medical conditions that are more common for people who are older than age 70.  Falls are a major cause of broken bones and head injuries in people who are older than age 66. Take precautions to prevent a fall at home.  Work with your health care provider to learn what changes you can make to improve your health and wellness and to prevent falls. This information is not intended to replace advice given to you by your health care provider. Make sure you discuss any questions you have with your health care provider. Document Released: 05/30/2017 Document Revised: 11/07/2018 Document Reviewed: 05/30/2017 Elsevier Patient Education  2020 [employer].

## 2019-03-19 NOTE — Assessment & Plan Note (Signed)
Patient decided to stop klonopin last year. Now using aleve PM intermittently with benefit.

## 2019-03-19 NOTE — Progress Notes (Signed)
This visit was conducted in person.  BP 118/64 (BP Location: Left Arm, Patient Position: Sitting, Cuff Size: Normal)   Pulse 70   Temp 98.5 F (36.9 C) (Temporal)   Ht 5\' 5"  (1.651 m) Comment: Per pt at home  Wt 176 lb (79.8 kg) Comment: Per pt at home  SpO2 94%   BMI 29.29 kg/m    CC: AMW/CPE Subjective:    Patient ID: [name], male    DOB: January 25, 1933, 85 y.o.   MRN: [id number]  HPI: [name] is a 85 y.o. male presenting on 03/19/2019 for Medicare Wellness   Did not see health advisor this year.   Saw urology last year for decreased libido and sexual dysfunction - planned testosterone evaluation. No further records available. Pt states normal exam.  Fasting today.  New puppy at home.  Sleeps prone.  Notes some trouble with memory.  Ongoing vivid dreams.   Hearing Screening   125Hz  250Hz  500Hz  1000Hz  2000Hz  3000Hz  4000Hz  6000Hz  8000Hz   Right ear:           Left ear:           Comments: Wears bilateral hearing aids  Vision Screening Comments: Last eye exam, 03/2019    Office Visit from 03/19/2019 in [facility] [place]  PHQ-2 Total Score  0      Fall Risk  03/19/2019 03/12/2018 02/21/2017 02/15/2016 02/15/2016  Falls in the past year? 0 No No No No     Preventative: COLONOSCOPY Date: 07/2013 no polyps [name]  Prostate - elevated PSA in past with normal biopsy per patient - this was followed by Dr. Mena Goes in past Q6 mo active surveillance. Has not returned since 01/2013.Doesnot want to return to uro. Latest labs had normalized Lung cancer screening - not eligible Flu shot - yearly Tetanus 2010  Pneumovax 07/2002. prevnar: 01/2014 Tdap 2012 zostavax 2008 Shingrix - discussed, declines  Advanced directives: has this at home. Wife [name] is HCPOA.Declines bringing [place] a copy.  Seat belt use discussed Sunscreen use discussed. No changing moles on skin.  Ex-smoker - quit 2000 (50 PY hx) Alcohol - states he was drinking "too much."  States he quit drinking alcohol 3d ago! Has quit before, has never had withdrawals from alcohol.  Dentist - Q4 mo  Eye exam - at least yearly  Bowel - occasional constipation - declines treatment.  Bladder - no incontinence   Lives with [name] wife (married since 2005) Occupation: Medical sales representative, retired Edu: 18+ yrs Activity: walking 2 miles a day Diet: good water, fruits/vegetables daily. Following mediterranean diet. Drinks unsweet tea, avoid soft drinks.      Relevant past medical, surgical, family and social history reviewed and updated as indicated. Interim medical history since our last visit reviewed. Allergies and medications reviewed and updated. Outpatient Medications Prior to Visit  Medication Sig Dispense Refill  . Coenzyme Q10 (COQ-10) 50 MG CAPS Take by mouth daily.    . Milk Thistle Extract 175 MG TABS Take 1.4286 tablets (250 mg total) by mouth 3 (three) times a week.    . Multiple Vitamin (MULTIVITAMIN) tablet Take 1 tablet by mouth daily.    . multivitamin-lutein (OCUVITE-LUTEIN) CAPS capsule Take 1 capsule by mouth daily.    . Omega-3 Fatty Acids (FISH OIL) 1000 MG CAPS Take 2,000 each by mouth daily.    Marland Kitchen POTASSIUM PO Take 500 mg by mouth. Takes 3 times weekly    . vitamin k 100 MCG tablet Take  1 tablet (100 mcg total) by mouth daily.    Marland Kitchen. Zn-Pyg Afri-Nettle-Saw Palmet (SAW PALMETTO COMPLEX PO) Take 250 mg by mouth every other day.     Marland Kitchen. aspirin 81 MG tablet Take 81 mg by mouth every other day.     . metoprolol tartrate (LOPRESSOR) 25 MG tablet Take 0.5 tablets (12.5 mg total) by mouth 2 (two) times daily. 90 tablet 3  . triamterene-hydrochlorothiazide (MAXZIDE-25) 37.5-25 MG tablet TAKE 1 TABLET BY MOUTH EVERY OTHER DAY 45 tablet 3  . Flaxseed, Linseed, (FLAXSEED OIL) 1000 MG CAPS Take 1,200 mg by mouth daily.    . Potassium 99 MG TABS Take 1 tablet (99 mg total) by mouth 3 (three) times a week. 330 each 0  . traZODone (DESYREL) 50 MG tablet TAKE 1/2 TO 1 TABLET AT  BEDTIME AS NEEDED FOR SLEEP. 30 tablet 6   No facility-administered medications prior to visit.      Per HPI unless specifically indicated in ROS section below Review of Systems  Constitutional: Negative for activity change, appetite change, chills, fatigue, fever and unexpected weight change.  HENT: Negative for hearing loss.   Eyes: Negative for visual disturbance.  Respiratory: Negative for cough, chest tightness, shortness of breath and wheezing.   Cardiovascular: Negative for chest pain, palpitations and leg swelling.  Gastrointestinal: Positive for constipation. Negative for abdominal distention, abdominal pain, blood in stool, diarrhea, nausea and vomiting.  Genitourinary: Negative for difficulty urinating and hematuria.  Musculoskeletal: Positive for back pain (R buttock pain). Negative for arthralgias, myalgias and neck pain.  Skin: Negative for rash.  Neurological: Negative for dizziness, seizures, syncope and headaches.  Hematological: Negative for adenopathy. Bruises/bleeds easily.  Psychiatric/Behavioral: Negative for dysphoric mood. The patient is not nervous/anxious.    Objective:    BP 118/64 (BP Location: Left Arm, Patient Position: Sitting, Cuff Size: Normal)   Pulse 70   Temp 98.5 F (36.9 C) (Temporal)   Ht 5\' 5"  (1.651 m) Comment: Per pt at home  Wt 176 lb (79.8 kg) Comment: Per pt at home  SpO2 94%   BMI 29.29 kg/m   Wt Readings from Last 3 Encounters:  03/19/19 176 lb (79.8 kg)  03/14/18 186 lb 12 oz (84.7 kg)  03/12/18 189 lb (85.7 kg)    Physical Exam Vitals signs and nursing note reviewed.  Constitutional:      General: He is not in acute distress.    Appearance: Normal appearance. He is well-developed. He is not ill-appearing.  HENT:     Head: Normocephalic and atraumatic.     Right Ear: Hearing, tympanic membrane, ear canal and external ear normal.     Left Ear: Hearing, tympanic membrane, ear canal and external ear normal.     Nose: Nose  normal.     Mouth/Throat:     Mouth: Mucous membranes are moist.     Pharynx: Uvula midline. No oropharyngeal exudate or posterior oropharyngeal erythema.  Eyes:     General: No scleral icterus.    Extraocular Movements: Extraocular movements intact.     Conjunctiva/sclera: Conjunctivae normal.     Pupils: Pupils are equal, round, and reactive to light.  Neck:     Musculoskeletal: Normal range of motion and neck supple.     Vascular: No carotid bruit.  Cardiovascular:     Rate and Rhythm: Normal rate and regular rhythm.     Pulses: Normal pulses.          Radial pulses are 2+ on the right  side and 2+ on the left side.     Heart sounds: Normal heart sounds. No murmur.  Pulmonary:     Effort: Pulmonary effort is normal. No respiratory distress.     Breath sounds: Normal breath sounds. No wheezing, rhonchi or rales.  Abdominal:     General: Abdomen is flat. Bowel sounds are normal. There is no distension.     Palpations: Abdomen is soft. There is no mass.     Tenderness: There is no abdominal tenderness. There is no guarding or rebound.     Hernia: No hernia is present.  Musculoskeletal: Normal range of motion.     Right lower leg: No edema.     Left lower leg: No edema.  Lymphadenopathy:     Cervical: No cervical adenopathy.  Skin:    General: Skin is warm and dry.     Findings: No rash.  Neurological:     General: No focal deficit present.     Mental Status: He is alert and oriented to person, place, and time.     Comments:  CN grossly intact, station and gait intact Recall 1/3, 1/3 with cue Calculation 4/5 [id number]  Psychiatric:        Mood and Affect: Mood normal.        Behavior: Behavior normal.        Thought Content: Thought content normal.        Judgment: Judgment normal.       Results for orders placed or performed in visit on 03/12/18  CBC with Differential/Platelet  Result Value Ref Range   WBC 4.8 4.0 - 10.5 K/uL   RBC 3.85 (L) 4.22 - 5.81 Mil/uL    Hemoglobin 13.9 13.0 - 17.0 g/dL   HCT 16.141.1 09.639.0 - 04.552.0 %   MCV 106.7 (H) 78.0 - 100.0 fl   MCHC 33.7 30.0 - 36.0 g/dL   RDW 40.914.0 81.111.5 - 91.415.5 %   Platelets 182.0 150.0 - 400.0 K/uL   Neutrophils Relative % 39.8 (L) 43.0 - 77.0 %   Lymphocytes Relative 39.9 12.0 - 46.0 %   Monocytes Relative 13.4 (H) 3.0 - 12.0 %   Eosinophils Relative 6.3 (H) 0.0 - 5.0 %   Basophils Relative 0.6 0.0 - 3.0 %   Neutro Abs 1.9 1.4 - 7.7 K/uL   Lymphs Abs 1.9 0.7 - 4.0 K/uL   Monocytes Absolute 0.6 0.1 - 1.0 K/uL   Eosinophils Absolute 0.3 0.0 - 0.7 K/uL   Basophils Absolute 0.0 0.0 - 0.1 K/uL  Microalbumin / creatinine urine ratio  Result Value Ref Range   Microalb, Ur <0.7 0.0 - 1.9 mg/dL   Creatinine,U 78.282.0 mg/dL   Microalb Creat Ratio 0.9 0.0 - 30.0 mg/g  Comprehensive metabolic panel  Result Value Ref Range   Sodium 137 135 - 145 mEq/L   Potassium 3.9 3.5 - 5.1 mEq/L   Chloride 101 96 - 112 mEq/L   CO2 31 19 - 32 mEq/L   Glucose, Bld 108 (H) 70 - 99 mg/dL   BUN 15 6 - 23 mg/dL   Creatinine, Ser 9.561.07 0.40 - 1.50 mg/dL   Total Bilirubin 0.6 0.2 - 1.2 mg/dL   Alkaline Phosphatase 46 39 - 117 U/L   AST 23 0 - 37 U/L   ALT 17 0 - 53 U/L   Total Protein 7.0 6.0 - 8.3 g/dL   Albumin 4.1 3.5 - 5.2 g/dL   Calcium 21.310.0 8.4 - 08.610.5 mg/dL   GFR 57.8470.18 >69.62>60.00  mL/min  Lipid panel  Result Value Ref Range   Cholesterol 170 0 - 200 mg/dL   Triglycerides 28.452.0 0.0 - 149.0 mg/dL   HDL 13.2482.80 >40.10>39.00 mg/dL   VLDL 27.210.4 0.0 - 53.640.0 mg/dL   LDL Cholesterol 77 0 - 99 mg/dL   Total CHOL/HDL Ratio 2    NonHDL 87.22   Folate  Result Value Ref Range   Folate >24.1 >5.9 ng/mL   Lab Results  Component Value Date   VITAMINB12 594 02/09/2016    Assessment & Plan:   Problem List Items Addressed This Visit    Memory deficit    Some trouble with recall today. Encouraged continued physical activity, social engagement, encouraged reading and word puzzles to keep memory active. Update if worsening memory trouble  noted by pt or wife.      Medicare annual wellness visit, subsequent - Primary    I have personally reviewed the Medicare Annual Wellness questionnaire and have noted 1. The patient's medical and social history 2. Their use of alcohol, tobacco or illicit drugs 3. Their current medications and supplements 4. The patient's functional ability including ADL's, fall risks, home safety risks and hearing or visual impairment. Cognitive function has been assessed and addressed as indicated.  5. Diet and physical activity 6. Evidence for depression or mood disorders The patients weight, height, BMI have been recorded in the chart. I have made referrals, counseling and provided education to the patient based on review of the above and I have provided the pt with a written personalized care plan for preventive services. Provider list updated.. See scanned questionairre as needed for further documentation. Reviewed preventative protocols and updated unless pt declined.       Macrocytosis without anemia    Update b12. Anticipate alcohol related. Congratulated on quitting, encouraged abstinence.       Relevant Orders   CBC with Differential/Platelet   Vitamin B12   Insomnia    Patient decided to stop klonopin last year. Now using aleve PM intermittently with benefit.       HTN (hypertension)    Chronic, stable on current regimen continue.       Relevant Medications   metoprolol tartrate (LOPRESSOR) 25 MG tablet   triamterene-hydrochlorothiazide (MAXZIDE-25) 37.5-25 MG tablet   Other Relevant Orders   Lipid panel   Comprehensive metabolic panel   Health maintenance examination    Preventative protocols reviewed and updated unless pt declined. Discussed healthy diet and lifestyle.       COPD (chronic obstructive pulmonary disease) (HCC)    Asxs. Not on respiratory medication.       Alcohol dependence (HCC)    Has decided to quit alcohol again - quit this week. Encouraged full  cessation, discussed monitoring for withdrawal symptoms.       Advanced care planning/counseling discussion    Advanced directives: has this at home. Wife Ernie HewLinda Ellis is HCPOA.Declines bringing us a copy.        Other Visit Diagnoses    Need for influenza vaccination       Relevant Orders   Flu Vaccine QUAD 36+ mos IM (Completed)       Meds ordered this encounter  Medications  . Cholecalciferol (VITAMIN D3) 25 MCG (1000 UT) CAPS    Sig: Take 1 capsule (1,000 Units total) by mouth daily. In winter months  . Naproxen Sod-diphenhydrAMINE (ALEVE PM) 220-25 MG TABS    Sig: Take 1 tablet by mouth at bedtime as needed (insomnia).  . metoprolol  tartrate (LOPRESSOR) 25 MG tablet    Sig: Take 0.5 tablets (12.5 mg total) by mouth 2 (two) times daily.    Dispense:  90 tablet    Refill:  3  . triamterene-hydrochlorothiazide (MAXZIDE-25) 37.5-25 MG tablet    Sig: Take 1 tablet by mouth every other day.    Dispense:  45 tablet    Refill:  3   Orders Placed This Encounter  Procedures  . Flu Vaccine QUAD 36+ mos IM  . Lipid panel  . Comprehensive metabolic panel  . CBC with Differential/Platelet  . Vitamin B12    Follow up plan: Return in about 1 year (around 03/18/2020) for annual exam, prior fasting for blood work, medicare wellness visit.  Ria Bush, MD

## 2019-03-19 NOTE — Assessment & Plan Note (Addendum)
Has decided to quit alcohol again - quit this week. Encouraged full cessation, discussed monitoring for withdrawal symptoms.

## 2019-03-19 NOTE — Assessment & Plan Note (Signed)
Chronic, stable on current regimen - continue. 

## 2019-03-19 NOTE — Assessment & Plan Note (Signed)
Update b12. Anticipate alcohol related. Congratulated on quitting, encouraged abstinence.

## 2019-04-08 ENCOUNTER — Encounter: Payer: Self-pay | Admitting: Family Medicine

## 2019-04-08 ENCOUNTER — Ambulatory Visit (INDEPENDENT_AMBULATORY_CARE_PROVIDER_SITE_OTHER): Payer: Medicare Other | Admitting: Family Medicine

## 2019-04-08 ENCOUNTER — Other Ambulatory Visit: Payer: Self-pay

## 2019-04-08 VITALS — BP 118/70 | HR 87 | Temp 98.4°F | Ht 65.0 in | Wt 184.2 lb

## 2019-04-08 DIAGNOSIS — F1020 Alcohol dependence, uncomplicated: Secondary | ICD-10-CM

## 2019-04-08 DIAGNOSIS — R413 Other amnesia: Secondary | ICD-10-CM

## 2019-04-08 MED ORDER — GINKGO BILOBA 40 MG PO TABS: 2.0000 | ORAL_TABLET | Freq: Every day | ORAL | Status: DC

## 2019-04-08 NOTE — Assessment & Plan Note (Signed)
Down to 1 glass of wine a day - notes improvement in nocturia with decreased alcohol.

## 2019-04-08 NOTE — Progress Notes (Signed)
This visit was conducted in person.  BP 118/70 (BP Location: Left Arm, Patient Position: Sitting, Cuff Size: Normal)   Pulse 87   Temp 98.4 F (36.9 C) (Temporal)   Ht 5\' 5"  (1.651 m)   Wt 184 lb 4 oz (83.6 kg)   SpO2 98%   BMI 30.66 kg/m    CC: discuss memory Subjective:    Patient ID: [name], male    DOB: May 17, 1933, 85 y.o.   MRN: [id number]  HPI: [name] is a 85 y.o. male presenting on 04/08/2019 for Memory Loss (Wants to discuss memory issues. )   See prior note for details. Enjoying his new Malti-poo.  Seen here last month for medicare wellness visit, noted trouble with memory at that time, some trouble with recall as well. H/o alcohol use (now has minimized).   Notes trouble remembering wife's family's names.  Daughters also note some trouble with memory.   Geriatric Assessment:  Activities of Daily Living:     Bathing- independent    Dressing- independent    Eating- independent    Toileting- independent    Transferring- independent     Continence- independent  Overall Assessment: independent  Instrumental Activities of Daily Living:     Transportation- independent     Meal/Food Preparation- independent     Shopping Errands- independent     Housekeeping/Chores- independent     Money Management/Finances- independent     Medication Management- independent     Ability to Use Telephone- independent     Laundry- independent  Overall Assessment: independent  Mental Status Exam: 29/30 (value/max value)    Missed intersecting pentagons  Clock Drawing Score: 3/4 - wrote #s as roman numerals, missed 2.           Relevant past medical, surgical, family and social history reviewed and updated as indicated. Interim medical history since our last visit reviewed. Allergies and medications reviewed and updated. Outpatient Medications Prior to Visit  Medication Sig Dispense Refill  . Cholecalciferol (VITAMIN D3) 25 MCG (1000 UT) CAPS Take 1 capsule  (1,000 Units total) by mouth daily. In winter months    . Coenzyme Q10 (COQ-10) 50 MG CAPS Take by mouth daily.    . metoprolol tartrate (LOPRESSOR) 25 MG tablet Take 0.5 tablets (12.5 mg total) by mouth 2 (two) times daily. 90 tablet 3  . Milk Thistle Extract 175 MG TABS Take 1.4286 tablets (250 mg total) by mouth 3 (three) times a week.    . Multiple Vitamin (MULTIVITAMIN) tablet Take 1 tablet by mouth daily.    . multivitamin-lutein (OCUVITE-LUTEIN) CAPS capsule Take 1 capsule by mouth daily.    . Naproxen Sod-diphenhydrAMINE (ALEVE PM) 220-25 MG TABS Take 1 tablet by mouth at bedtime as needed (insomnia).    . Omega-3 Fatty Acids (FISH OIL) 1000 MG CAPS Take 2,000 each by mouth daily.    Marland Kitchen POTASSIUM PO Take 500 mg by mouth. Takes 3 times weekly    . triamterene-hydrochlorothiazide (MAXZIDE-25) 37.5-25 MG tablet Take 1 tablet by mouth every other day. 45 tablet 3  . vitamin k 100 MCG tablet Take 1 tablet (100 mcg total) by mouth daily.    Marland Kitchen Zn-Pyg Afri-Nettle-Saw Palmet (SAW PALMETTO COMPLEX PO) Take 250 mg by mouth every other day.      No facility-administered medications prior to visit.      Per HPI unless specifically indicated in ROS section below Review of Systems Objective:    BP 118/70 (BP Location:  Left Arm, Patient Position: Sitting, Cuff Size: Normal)   Pulse 87   Temp 98.4 F (36.9 C) (Temporal)   Ht 5\' 5"  (1.651 m)   Wt 184 lb 4 oz (83.6 kg)   SpO2 98%   BMI 30.66 kg/m   Wt Readings from Last 3 Encounters:  04/08/19 184 lb 4 oz (83.6 kg)  03/19/19 176 lb (79.8 kg)  03/14/18 186 lb 12 oz (84.7 kg)    Physical Exam Vitals signs and nursing note reviewed.  Constitutional:      Appearance: Normal appearance. He is not ill-appearing.  Neurological:     Mental Status: He is alert.  Psychiatric:        Mood and Affect: Mood normal.        Behavior: Behavior normal.       Results for orders placed or performed in visit on 03/19/19  Lipid panel  Result Value Ref  Range   Cholesterol 182 0 - 200 mg/dL   Triglycerides 16.155.0 0.0 - 149.0 mg/dL   HDL 09.6099.10 >45.40>39.00 mg/dL   VLDL 98.111.0 0.0 - 19.140.0 mg/dL   LDL Cholesterol 72 0 - 99 mg/dL   Total CHOL/HDL Ratio 2    NonHDL 82.94   Comprehensive metabolic panel  Result Value Ref Range   Sodium 138 135 - 145 mEq/L   Potassium 4.2 3.5 - 5.1 mEq/L   Chloride 101 96 - 112 mEq/L   CO2 30 19 - 32 mEq/L   Glucose, Bld 110 (H) 70 - 99 mg/dL   BUN 16 6 - 23 mg/dL   Creatinine, Ser 4.781.02 0.40 - 1.50 mg/dL   Total Bilirubin 0.6 0.2 - 1.2 mg/dL   Alkaline Phosphatase 58 39 - 117 U/L   AST 24 0 - 37 U/L   ALT 17 0 - 53 U/L   Total Protein 6.9 6.0 - 8.3 g/dL   Albumin 4.4 3.5 - 5.2 g/dL   Calcium 29.510.6 (H) 8.4 - 10.5 mg/dL   GFR 62.1369.60 >08.65>60.00 mL/min  CBC with Differential/Platelet  Result Value Ref Range   WBC 5.0 4.0 - 10.5 K/uL   RBC 3.51 (L) 4.22 - 5.81 Mil/uL   Hemoglobin 13.0 13.0 - 17.0 g/dL   HCT 78.438.4 (L) 69.639.0 - 29.552.0 %   MCV 109.5 (H) 78.0 - 100.0 fl   MCHC 34.0 30.0 - 36.0 g/dL   RDW 28.414.0 13.211.5 - 44.015.5 %   Platelets 176.0 150.0 - 400.0 K/uL   Neutrophils Relative % 53.5 43.0 - 77.0 %   Lymphocytes Relative 30.6 12.0 - 46.0 %   Monocytes Relative 13.5 (H) 3.0 - 12.0 %   Eosinophils Relative 1.8 0.0 - 5.0 %   Basophils Relative 0.6 0.0 - 3.0 %   Neutro Abs 2.7 1.4 - 7.7 K/uL   Lymphs Abs 1.5 0.7 - 4.0 K/uL   Monocytes Absolute 0.7 0.1 - 1.0 K/uL   Eosinophils Absolute 0.1 0.0 - 0.7 K/uL   Basophils Absolute 0.0 0.0 - 0.1 K/uL  Vitamin B12  Result Value Ref Range   Vitamin B-12 1,310 (H) 211 - 911 pg/mL   Assessment & Plan:  Over 25 minutes were spent face-to-face with the patient during this encounter and >50% of that time was spent on counseling and coordination of care  Problem List Items Addressed This Visit    Memory deficit - Primary    MMSE today reassuringly ok (29/30). Reassurance provided. Reviewed supportive measures of physical activity, social engagement, regular activities that exercise  mind,  and healthy diet choices. Will continue to monitor. Pt agrees with plan.  Suggested he stop extra b12 supplementation as his levels were elevated.       Alcohol dependence (HCC)    Down to 1 glass of wine a day - notes improvement in nocturia with decreased alcohol.           Meds ordered this encounter  Medications  . Ginkgo Biloba 40 MG TABS    Sig: Take 2 tablets (80 mg total) by mouth daily.   No orders of the defined types were placed in this encounter.  Patient Instructions  Hold extra vitamin B12 as your levels were high. Memory testing today was looking good!  In meantime, continue working on regular exercise routine, healthy nutritious diet with fruits/vegetables, whole grains, social engagement and reading, word puzzles, memory games, sudoku.    Follow up plan: Return if symptoms worsen or fail to improve.  Ria Bush, MD

## 2019-04-08 NOTE — Assessment & Plan Note (Addendum)
MMSE today reassuringly ok (29/30). Reassurance provided. Reviewed supportive measures of physical activity, social engagement, regular activities that exercise mind, and healthy diet choices. Will continue to monitor. Pt agrees with plan.  Suggested he stop extra b12 supplementation as his levels were elevated.

## 2019-04-08 NOTE — Patient Instructions (Addendum)
Hold extra vitamin B12 as your levels were high. Memory testing today was looking good!  In meantime, continue working on regular exercise routine, healthy nutritious diet with fruits/vegetables, whole grains, social engagement and reading, word puzzles, memory games, sudoku.

## 2019-05-27 ENCOUNTER — Encounter (INDEPENDENT_AMBULATORY_CARE_PROVIDER_SITE_OTHER): Payer: Self-pay

## 2019-07-29 ENCOUNTER — Encounter: Payer: Self-pay | Admitting: Family Medicine

## 2019-07-29 NOTE — Telephone Encounter (Signed)
Message sent to the patient Good Morning Mr Hounshell,   We still do not know a whole lot about the vaccine at this point. We do not know when this will be available to the public (patient's). Right now they are starting to administer to Front-line providers/Healthcare workers. Once this becomes available for patient administration I am sure they will send out a mass MyChart message with further information. We most likely at this time will not be housing the vaccine as we do not have the facilities to store the vaccine. This will have to be taken at vaccine sites or the pharmacy. Please see the message below for further explanation. I will also send this message to Dr Danise Mina for further recommendation.  *The [facility] Department of Health and Human services is in charge of vaccine distribution in our state. They decided the best way to fight COVID-19 is to start first with vaccinations for those most at risk. That means the first phase- phase 1a- includes health care workers fighting COVID-19 and those working and residing in long-term care facilities. We at [facility] are in ongoing partnership with the state to determine our role in each phase of the vaccination plan. We do not currently have information on when vaccines will be made available for our patients or the broader public. As additional vaccines become available, we are committed to making COVID-19 vaccines available as soon as possible for all our patients and everyone in the communities we serve who seeks a vaccine and who fits the criteria to receive the vaccine. As we receive more information from the state, we will make that information available to our patients and on our website at [email or web address]   For more information, you can visit the [place] Department of Health and [employer] at [email or web address] and click on their [id number] section or call the state's COVID-19 information phone number at 211.  Please advise if  anything more to recommend to patient. Pt is asking about age -- if this will determine priority

## 2019-09-16 ENCOUNTER — Encounter: Payer: Self-pay | Admitting: Family Medicine

## 2019-09-17 NOTE — Telephone Encounter (Signed)
Documented 1st shot in chart.

## 2020-01-07 DIAGNOSIS — M545 Low back pain: Secondary | ICD-10-CM | POA: Diagnosis not present

## 2020-01-07 DIAGNOSIS — M25552 Pain in left hip: Secondary | ICD-10-CM | POA: Diagnosis not present

## 2020-02-26 ENCOUNTER — Encounter: Payer: Self-pay | Admitting: Family Medicine

## 2020-03-08 ENCOUNTER — Other Ambulatory Visit: Payer: Self-pay | Admitting: Family Medicine

## 2020-03-09 NOTE — Telephone Encounter (Signed)
E-scribed refill.  Pt scheduled for cpe scheduled on 04/19/20.  Plz schedule wellness and lab visits before 04/19/20 visit.

## 2020-03-22 ENCOUNTER — Encounter: Payer: Medicare Other | Admitting: Family Medicine

## 2020-04-03 ENCOUNTER — Other Ambulatory Visit: Payer: Self-pay | Admitting: Family Medicine

## 2020-04-14 ENCOUNTER — Encounter: Payer: Self-pay | Admitting: Family Medicine

## 2020-04-14 NOTE — Telephone Encounter (Signed)
Updated immunizations

## 2020-04-15 ENCOUNTER — Encounter: Payer: Self-pay | Admitting: Family Medicine

## 2020-04-15 NOTE — Telephone Encounter (Signed)
Pt is asking about making Mondays' appt virtual - will his insurance cover a virtual AMW and physical?

## 2020-04-16 NOTE — Telephone Encounter (Signed)
I called [employer] and checked on patient's benefits. They stated he does not have any telehealth benefits with his plan, so they will not cover any virtual visits. I called and let the patient know. He states he will come into the office on Monday for his appointment.

## 2020-04-19 ENCOUNTER — Encounter: Payer: Self-pay | Admitting: Family Medicine

## 2020-04-19 ENCOUNTER — Other Ambulatory Visit: Payer: Self-pay

## 2020-04-19 ENCOUNTER — Ambulatory Visit (INDEPENDENT_AMBULATORY_CARE_PROVIDER_SITE_OTHER): Payer: Medicare Other | Admitting: Family Medicine

## 2020-04-19 VITALS — BP 120/60 | HR 72 | Temp 97.8°F | Ht 66.0 in | Wt 183.0 lb

## 2020-04-19 DIAGNOSIS — R739 Hyperglycemia, unspecified: Secondary | ICD-10-CM

## 2020-04-19 DIAGNOSIS — Z0000 Encounter for general adult medical examination without abnormal findings: Secondary | ICD-10-CM | POA: Diagnosis not present

## 2020-04-19 DIAGNOSIS — R413 Other amnesia: Secondary | ICD-10-CM

## 2020-04-19 DIAGNOSIS — H9113 Presbycusis, bilateral: Secondary | ICD-10-CM

## 2020-04-19 DIAGNOSIS — I10 Essential (primary) hypertension: Secondary | ICD-10-CM | POA: Diagnosis not present

## 2020-04-19 DIAGNOSIS — F1020 Alcohol dependence, uncomplicated: Secondary | ICD-10-CM

## 2020-04-19 DIAGNOSIS — L989 Disorder of the skin and subcutaneous tissue, unspecified: Secondary | ICD-10-CM | POA: Diagnosis not present

## 2020-04-19 DIAGNOSIS — Z7189 Other specified counseling: Secondary | ICD-10-CM

## 2020-04-19 DIAGNOSIS — D7589 Other specified diseases of blood and blood-forming organs: Secondary | ICD-10-CM

## 2020-04-19 DIAGNOSIS — G4700 Insomnia, unspecified: Secondary | ICD-10-CM

## 2020-04-19 DIAGNOSIS — J449 Chronic obstructive pulmonary disease, unspecified: Secondary | ICD-10-CM

## 2020-04-19 LAB — COMPREHENSIVE METABOLIC PANEL
ALT: 16 U/L (ref 0–53)
AST: 24 U/L (ref 0–37)
Albumin: 4.5 g/dL (ref 3.5–5.2)
Alkaline Phosphatase: 61 U/L (ref 39–117)
BUN: 11 mg/dL (ref 6–23)
CO2: 31 mEq/L (ref 19–32)
Calcium: 10.8 mg/dL — ABNORMAL HIGH (ref 8.4–10.5)
Chloride: 94 mEq/L — ABNORMAL LOW (ref 96–112)
Creatinine, Ser: 1 mg/dL (ref 0.40–1.50)
GFR: 71.03 mL/min (ref >60.00)
Glucose, Bld: 96 mg/dL (ref 70–99)
Potassium: 4.5 mEq/L (ref 3.5–5.1)
Sodium: 133 mEq/L — ABNORMAL LOW (ref 135–145)
Total Bilirubin: 0.6 mg/dL (ref 0.2–1.2)
Total Protein: 7.1 g/dL (ref 6.0–8.3)

## 2020-04-19 LAB — HEMOGLOBIN A1C: Hgb A1c MFr Bld: 5.5 % (ref 4.6–6.5)

## 2020-04-19 LAB — CBC WITH DIFFERENTIAL/PLATELET
Basophils Absolute: 0 10*3/uL (ref 0.0–0.1)
Basophils Relative: 0.6 % (ref 0.0–3.0)
Eosinophils Absolute: 0.1 10*3/uL (ref 0.0–0.7)
Eosinophils Relative: 1.3 % (ref 0.0–5.0)
HCT: 39.8 % (ref 39.0–52.0)
Hemoglobin: 13.5 g/dL (ref 13.0–17.0)
Lymphocytes Relative: 24.6 % (ref 12.0–46.0)
Lymphs Abs: 1.7 10*3/uL (ref 0.7–4.0)
MCHC: 34 g/dL (ref 30.0–36.0)
MCV: 108.7 fl — ABNORMAL HIGH (ref 78.0–100.0)
Monocytes Absolute: 0.8 10*3/uL (ref 0.1–1.0)
Monocytes Relative: 11.2 % (ref 3.0–12.0)
Neutro Abs: 4.2 10*3/uL (ref 1.4–7.7)
Neutrophils Relative %: 62.3 % (ref 43.0–77.0)
Platelets: 196 10*3/uL (ref 150.0–400.0)
RBC: 3.66 Mil/uL — ABNORMAL LOW (ref 4.22–5.81)
RDW: 13.6 % (ref 11.5–15.5)
WBC: 6.7 10*3/uL (ref 4.0–10.5)

## 2020-04-19 LAB — TSH: TSH: 2.29 u[IU]/mL (ref 0.35–4.50)

## 2020-04-19 LAB — LIPID PANEL
Cholesterol: 162 mg/dL (ref 0–200)
HDL: 88.8 mg/dL (ref >39.00)
LDL Cholesterol: 63 mg/dL (ref 0–99)
NonHDL: 73.41
Total CHOL/HDL Ratio: 2
Triglycerides: 51 mg/dL (ref 0.0–149.0)
VLDL: 10.2 mg/dL (ref 0.0–40.0)

## 2020-04-19 LAB — VITAMIN B12: Vitamin B-12: 1098 pg/mL — ABNORMAL HIGH (ref 211–911)

## 2020-04-19 LAB — FOLATE: Folate: >24.8 ng/mL (ref >5.9)

## 2020-04-19 MED ORDER — TRIAMTERENE-HCTZ 37.5-25 MG PO TABS
1.0000 | ORAL_TABLET | ORAL | 0 refills | Status: DC
Start: 2020-04-19 — End: 2020-06-29

## 2020-04-19 MED ORDER — METOPROLOL TARTRATE 25 MG PO TABS: 12.5000 mg | ORAL_TABLET | Freq: Two times a day (BID) | ORAL | 3 refills | Status: DC
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
## 2020-04-19 NOTE — Patient Instructions (Addendum)
Labs today  Call to schedule dermatologist appointment.  Limit alcohol to no more than 8 oz wine per day, ideally the less the better.  Good to see you today. Return as needed or in 1 year for next physical.   Health Maintenance After Age 84 After age 8, you are at a higher risk for certain long-term diseases and infections as well as injuries from falls. Falls are a major cause of broken bones and head injuries in people who are older than age 58. Getting regular preventive care can help to keep you healthy and well. Preventive care includes getting regular testing and making lifestyle changes as recommended by your health care provider. Talk with your health care provider about:  Which screenings and tests you should have. A screening is a test that checks for a disease when you have no symptoms.  A diet and exercise plan that is right for you. What should I know about screenings and tests to prevent falls? Screening and testing are the best ways to find a health problem early. Early diagnosis and treatment give you the best chance of managing medical conditions that are common after age 66. Certain conditions and lifestyle choices may make you more likely to have a fall. Your health care provider may recommend:  Regular vision checks. Poor vision and conditions such as cataracts can make you more likely to have a fall. If you wear glasses, make sure to get your prescription updated if your vision changes.  Medicine review. Work with your health care provider to regularly review all of the medicines you are taking, including over-the-counter medicines. Ask your health care provider about any side effects that may make you more likely to have a fall. Tell your health care provider if any medicines that you take make you feel dizzy or sleepy.  Osteoporosis screening. Osteoporosis is a condition that causes the bones to get weaker. This can make the bones weak and cause them to break more  easily.  Blood pressure screening. Blood pressure changes and medicines to control blood pressure can make you feel dizzy.  Strength and balance checks. Your health care provider may recommend certain tests to check your strength and balance while standing, walking, or changing positions.  Foot health exam. Foot pain and numbness, as well as not wearing proper footwear, can make you more likely to have a fall.  Depression screening. You may be more likely to have a fall if you have a fear of falling, feel emotionally low, or feel unable to do activities that you used to do.  Alcohol use screening. Using too much alcohol can affect your balance and may make you more likely to have a fall. What actions can I take to lower my risk of falls? General instructions  Talk with your health care provider about your risks for falling. Tell your health care provider if: ? You fall. Be sure to tell your health care provider about all falls, even ones that seem minor. ? You feel dizzy, sleepy, or off-balance.  Take over-the-counter and prescription medicines only as told by your health care provider. These include any supplements.  Eat a healthy diet and maintain a healthy weight. A healthy diet includes low-fat dairy products, low-fat (lean) meats, and fiber from whole grains, beans, and lots of fruits and vegetables. Home safety  Remove any tripping hazards, such as rugs, cords, and clutter.  Install safety equipment such as grab bars in bathrooms and safety rails on stairs.  Keep rooms and walkways well-lit. Activity   Follow a regular exercise program to stay fit. This will help you maintain your balance. Ask your health care provider what types of exercise are appropriate for you.  If you need a cane or walker, use it as recommended by your health care provider.  Wear supportive shoes that have nonskid soles. Lifestyle  Do not drink alcohol if your health care provider tells you not to  drink.  If you drink alcohol, limit how much you have: ? 0-1 drink a day for women. ? 0-2 drinks a day for men.  Be aware of how much alcohol is in your drink. In the [place] one drink equals one typical bottle of beer (12 oz), one-half glass of wine (5 oz), or one shot of hard liquor (1 oz).  Do not use any products that contain nicotine or tobacco, such as cigarettes and e-cigarettes. If you need help quitting, ask your health care provider. Summary  Having a healthy lifestyle and getting preventive care can help to protect your health and wellness after age 36.  Screening and testing are the best way to find a health problem early and help you avoid having a fall. Early diagnosis and treatment give you the best chance for managing medical conditions that are more common for people who are older than age 50.  Falls are a major cause of broken bones and head injuries in people who are older than age 18. Take precautions to prevent a fall at home.  Work with your health care provider to learn what changes you can make to improve your health and wellness and to prevent falls. This information is not intended to replace advice given to you by your health care provider. Make sure you discuss any questions you have with your health care provider. Document Revised: 11/07/2018 Document Reviewed: 05/30/2017 Elsevier Patient Education  2020 [employer].

## 2020-04-20 ENCOUNTER — Other Ambulatory Visit: Payer: Self-pay | Admitting: Family Medicine

## 2020-04-20 ENCOUNTER — Encounter: Payer: Self-pay | Admitting: Family Medicine

## 2020-04-20 DIAGNOSIS — L989 Disorder of the skin and subcutaneous tissue, unspecified: Secondary | ICD-10-CM | POA: Insufficient documentation

## 2020-04-20 LAB — MICROALBUMIN / CREATININE URINE RATIO
Creatinine,U: 118.4 mg/dL
Microalb Creat Ratio: 1.1 mg/g (ref 0.0–30.0)
Microalb, Ur: 1.3 mg/dL (ref 0.0–1.9)

## 2020-04-20 LAB — PATHOLOGIST SMEAR REVIEW

## 2020-04-20 NOTE — Assessment & Plan Note (Signed)

## 2020-04-20 NOTE — Assessment & Plan Note (Signed)
Overall stable period without noted increased level of care need.

## 2020-04-20 NOTE — Assessment & Plan Note (Signed)
Managed with PRN aleve PM.

## 2020-04-20 NOTE — Assessment & Plan Note (Signed)
Preventative protocols reviewed and updated unless pt declined. Discussed healthy diet and lifestyle.  

## 2020-04-20 NOTE — Assessment & Plan Note (Signed)
Last year was drinking only 1 glass wine/day - now has increased to 4 glasses/day (16oz) - advised decreasing alcohol intake.

## 2020-04-20 NOTE — Assessment & Plan Note (Signed)
Persistent with MCV 109 - update labs with vitamin levels and periph smear.

## 2020-04-20 NOTE — Assessment & Plan Note (Signed)
Chronic, stable. Continue current regimen. 

## 2020-04-20 NOTE — Assessment & Plan Note (Signed)
Wears hearing aides 

## 2020-04-20 NOTE — Assessment & Plan Note (Signed)
Anticipate AK - he will call to schedule derm appt, let me know if needs referral

## 2020-04-20 NOTE — Assessment & Plan Note (Signed)
Has advanced directive at home, wife is HCPOA.

## 2020-04-20 NOTE — Assessment & Plan Note (Signed)
Asxs, not on respiratory medication.  

## 2020-06-06 ENCOUNTER — Encounter: Payer: Self-pay | Admitting: Family Medicine

## 2020-06-14 ENCOUNTER — Encounter: Payer: Self-pay | Admitting: Family Medicine

## 2020-06-29 ENCOUNTER — Other Ambulatory Visit: Payer: Self-pay | Admitting: Family Medicine

## 2020-08-23 DIAGNOSIS — Z9889 Other specified postprocedural states: Secondary | ICD-10-CM | POA: Diagnosis not present

## 2020-08-23 DIAGNOSIS — Z961 Presence of intraocular lens: Secondary | ICD-10-CM | POA: Diagnosis not present

## 2020-09-04 ENCOUNTER — Other Ambulatory Visit: Payer: Self-pay | Admitting: Family Medicine

## 2020-09-07 ENCOUNTER — Encounter: Payer: Self-pay | Admitting: Family Medicine

## 2020-10-13 ENCOUNTER — Ambulatory Visit (INDEPENDENT_AMBULATORY_CARE_PROVIDER_SITE_OTHER): Payer: Medicare Other | Admitting: Otolaryngology

## 2020-10-13 ENCOUNTER — Other Ambulatory Visit: Payer: Self-pay

## 2020-10-13 VITALS — Temp 97.2°F

## 2020-10-13 DIAGNOSIS — H6123 Impacted cerumen, bilateral: Secondary | ICD-10-CM | POA: Diagnosis not present

## 2020-10-13 DIAGNOSIS — H60543 Acute eczematoid otitis externa, bilateral: Secondary | ICD-10-CM | POA: Diagnosis not present

## 2020-10-13 NOTE — Progress Notes (Signed)
HPI: [name] is a 87 y.o. male who presents is referred by [facility] for evaluation of ear canals.  He has new hearing aids that work well but they cause scabbing and crusting of his skin he has a little bit of sore on the left side left ear canal.  He has had no drainage from the ears.  Slight pain in the left ear..  Past Medical History:  Diagnosis Date  . Choledocholithiasis 2009   sepsis s/p ICU stay with VDRF  . Chronic fungal otitis externa 2015   Jenne Pane)  . Colon polyp    adenomatous - Ganem  . COPD (chronic obstructive pulmonary disease) (HCC)   . DDD (degenerative disc disease)    cervical and lumbar spine  . Depression with anxiety   . ED (erectile dysfunction)    on viagra  . Elevated PSA 2014   s/p normal biopsy 2003, now followed by Dr. Mena Goes, monitoring  . History of alcohol abuse 1989   after bad divorce  . History of smoking 2000   50+ PY hx, s/p normal CT chest 2014  . HTN (hypertension)   . OSA (obstructive sleep apnea)    could not tolerate CPAP  . Peyronie disease   . Presbycusis of both ears 2015   rec updated binaural amplification  . Wears hearing aid    [facility]   Past Surgical History:  Procedure Laterality Date  . CARDIAC CATHETERIZATION  2009   WNL per records, [place]  . CHOLECYSTECTOMY  2009  . COLONOSCOPY  2009   adenomatous polyp, rec rpt 5 yrs [name]  . COLONOSCOPY  07/2013   no polyps (Ganem)  . ECTROPION REPAIR Bilateral 04/2018   bilat lower eyelids  . hospitalization  2010   ERCP with sphincertotomy and CBD stone removal - septic cholangiis with EtOH withdrawal, E coli bacteremia with septic shock, with arterial thromboembolization with discoloration of toes of R foot  . rectal fistula repair  1984  . REFRACTIVE SURGERY Right 03/2019  . TONSILLECTOMY  1941  . VASECTOMY  1969   Social History   Socioeconomic History  . Marital status: Married    Spouse name: Not on file  . Number of children: Not on  file  . Years of education: Not on file  . Highest education level: Not on file  Occupational History  . Not on file  Tobacco Use  . Smoking status: Former Smoker    Packs/day: 1.00    Years: 50.00    Pack years: 50.00    Start date: 07/31/1948    Quit date: 07/31/1998    Years since quitting: 22.2  . Smokeless tobacco: Never Used  Vaping Use  . Vaping Use: Never used  Substance and Sexual Activity  . Alcohol use: Yes    Alcohol/week: 6.0 - 7.0 standard drinks    Types: 6 - 7 Glasses of wine per week  . Drug use: No  . Sexual activity: Yes  Other Topics Concern  . Not on file  Social History Narrative   Lives with [name] wife (married since 2005)   Occupation: Medical sales representative, retired   Edu: 18+ yrs   Activity: walking regularly, but not as much as he would like to - wife has trouble keeping up.   Diet: good water, fruits/vegetables daily. Significant sweet tea.   Social Determinants of Health   Financial Resource Strain: Not on file  Food Insecurity: Not on file  Transportation Needs: Not on file  Physical Activity: Not on file  Stress: Not on file  Social Connections: Not on file   Family History  Problem Relation Age of Onset  . Cancer Father        throat?  Marland Kitchen Alcohol abuse Father   . Stroke Maternal Aunt        hemorrhagic  . Stroke Mother        ischemic  . Diabetes Neg Hx    Allergies  Allergen Reactions  . Sulfa Antibiotics Rash   Prior to Admission medications   Medication Sig Start Date End Date Taking? Authorizing Provider  Coenzyme Q10 (COQ-10) 50 MG CAPS Take by mouth daily.    [provider]  Ginkgo Biloba 40 MG TABS Take 2 tablets (80 mg total) by mouth daily. 04/08/19   Eustaquio Boyden, MD  metoprolol tartrate (LOPRESSOR) 25 MG tablet Take 0.5 tablets (12.5 mg total) by mouth 2 (two) times daily. 04/19/20   Eustaquio Boyden, MD  Milk Thistle Extract 175 MG TABS Take 1.4286 tablets (250 mg total) by mouth 3 (three) times a week. 03/15/18    Eustaquio Boyden, MD  Multiple Vitamin (MULTIVITAMIN) tablet Take 1 tablet by mouth daily.    [provider]  multivitamin-lutein (OCUVITE-LUTEIN) CAPS capsule Take 1 capsule by mouth daily.    [provider]  Naproxen Sod-diphenhydrAMINE (ALEVE PM) 220-25 MG TABS Take 1 tablet by mouth at bedtime as needed (insomnia). 03/19/19   Eustaquio Boyden, MD  POTASSIUM PO Take 500 mg by mouth. Takes 3 times weekly    [provider]  triamterene-hydrochlorothiazide (MAXZIDE-25) 37.5-25 MG tablet TAKE 1 TABLET BY MOUTH EVERY OTHER DAY 09/06/20   Eustaquio Boyden, MD  vitamin k 100 MCG tablet Take 1 tablet (100 mcg total) by mouth daily. 03/14/18   Eustaquio Boyden, MD  Zn-Pyg Afri-Nettle-Saw Palmet (SAW PALMETTO COMPLEX PO) Take 250 mg by mouth every other day.     [provider]     Positive ROS: Otherwise negative  All other systems have been reviewed and were otherwise negative with the exception of those mentioned in the HPI and as above.  Physical Exam: Constitutional: Alert, well-appearing, no acute distress Ears: External ears without lesions or tenderness.  He has minimal wax buildup and some dry skin in both ear canals that was cleaned in the office using suction and curettes.  Ear canals and TMs are otherwise clear.  Of note he has some slight flaking and dry skin in the concha area on both sides.  On the left side he has a crack of the skin superiorly with a little sore. Nasal: External nose without lesions. Clear nasal passages Oral: Lips and gums without lesions. Tongue and palate mucosa without lesions. Posterior oropharynx clear. Neck: No palpable adenopathy or masses Respiratory: Breathing comfortably  Skin: No facial/neck lesions or rash noted.  Cerumen impaction removal  Date/Time: 10/13/2020 11:20 AM Performed by: Drema Halon, MD Authorized by: Drema Halon, MD   Consent:    Consent obtained:  Verbal   Consent given  by:  Patient   Risks discussed:  Pain and bleeding Procedure details:    Location:  L ear and R ear   Procedure type: curette and suction   Post-procedure details:    Inspection:  TM intact and canal normal   Hearing quality:  Improved   Patient tolerance of procedure:  Tolerated well, no immediate complications Comments:     Ear canals were cleaned with suction bilaterally.  No evidence  of external otitis.  TMs are clear bilaterally.    Assessment: Mild wax buildup in both ears that was cleaned in the office. Dry skin of the concha area. Cracked skin superiorly at the opening of the left ear canal with slight inflammatory changes.  Plan: Recommend application of mupirocin 2% ointment to the small sore on the superior left ear canal twice a day for the next 5 days.  Also gave him a prescription for Diprolene 0.05% cream or lotion to use as needed itching.  Suggested using skin lotion to help with the dryness in the ear canals and keep the hearing aids cleaned with alcohol. Presently no evidence of external otitis.  TMs are clear bilaterally.   Narda Bonds, MD   CC:

## 2021-01-07 ENCOUNTER — Other Ambulatory Visit: Payer: Self-pay

## 2021-01-07 ENCOUNTER — Encounter (HOSPITAL_COMMUNITY): Payer: Self-pay

## 2021-01-07 ENCOUNTER — Emergency Department (HOSPITAL_COMMUNITY): Payer: Medicare Other

## 2021-01-07 ENCOUNTER — Emergency Department (HOSPITAL_COMMUNITY)
Admission: EM | Admit: 2021-01-07 | Discharge: 2021-01-07 | Disposition: A | Discharge status: Home / Self Care | Payer: Medicare Other | Attending: Emergency Medicine | Admitting: Emergency Medicine

## 2021-01-07 DIAGNOSIS — I10 Essential (primary) hypertension: Secondary | ICD-10-CM | POA: Insufficient documentation

## 2021-01-07 DIAGNOSIS — W19XXXA Unspecified fall, initial encounter: Secondary | ICD-10-CM

## 2021-01-07 DIAGNOSIS — S0003XA Contusion of scalp, initial encounter: Secondary | ICD-10-CM | POA: Diagnosis not present

## 2021-01-07 DIAGNOSIS — Y93K1 Activity, walking an animal: Secondary | ICD-10-CM | POA: Diagnosis not present

## 2021-01-07 DIAGNOSIS — Z87891 Personal history of nicotine dependence: Secondary | ICD-10-CM | POA: Diagnosis not present

## 2021-01-07 DIAGNOSIS — Y92480 Sidewalk as the place of occurrence of the external cause: Secondary | ICD-10-CM | POA: Insufficient documentation

## 2021-01-07 DIAGNOSIS — J449 Chronic obstructive pulmonary disease, unspecified: Secondary | ICD-10-CM | POA: Diagnosis not present

## 2021-01-07 DIAGNOSIS — Z23 Encounter for immunization: Secondary | ICD-10-CM | POA: Insufficient documentation

## 2021-01-07 DIAGNOSIS — S199XXA Unspecified injury of neck, initial encounter: Secondary | ICD-10-CM | POA: Diagnosis not present

## 2021-01-07 DIAGNOSIS — W010XXA Fall on same level from slipping, tripping and stumbling without subsequent striking against object, initial encounter: Secondary | ICD-10-CM | POA: Insufficient documentation

## 2021-01-07 DIAGNOSIS — S0990XA Unspecified injury of head, initial encounter: Secondary | ICD-10-CM | POA: Diagnosis present

## 2021-01-07 DIAGNOSIS — S0101XA Laceration without foreign body of scalp, initial encounter: Secondary | ICD-10-CM | POA: Diagnosis not present

## 2021-01-07 DIAGNOSIS — Z79899 Other long term (current) drug therapy: Secondary | ICD-10-CM | POA: Diagnosis not present

## 2021-01-07 DIAGNOSIS — M47812 Spondylosis without myelopathy or radiculopathy, cervical region: Secondary | ICD-10-CM | POA: Diagnosis not present

## 2021-01-07 DIAGNOSIS — R531 Weakness: Secondary | ICD-10-CM | POA: Diagnosis not present

## 2021-01-07 DIAGNOSIS — I6529 Occlusion and stenosis of unspecified carotid artery: Secondary | ICD-10-CM | POA: Diagnosis not present

## 2021-01-07 MED ORDER — TETANUS-DIPHTH-ACELL PERTUSSIS 5-2.5-18.5 LF-MCG/0.5 IM SUSY
0.5000 mL | PREFILLED_SYRINGE | Freq: Once | INTRAMUSCULAR | Status: AC
  Administered 2021-01-07: 0.5 mL via INTRAMUSCULAR
  Filled 2021-01-07: qty 0.5

## 2021-01-07 MED ORDER — LIDOCAINE-EPINEPHRINE 1 %-1:100000 IJ SOLN
10.0000 mL | Freq: Once | INTRAMUSCULAR | Status: AC
  Administered 2021-01-07: 10 mL via INTRADERMAL
  Filled 2021-01-07: qty 1

## 2021-01-07 NOTE — ED Notes (Signed)
Discharge instructions discussed with pt. Pt verbalized understanding. Pt stable and ambulatory. No signature pad available. 

## 2021-01-07 NOTE — ED Notes (Signed)
Wound cleaned and new dressing applied.

## 2021-01-07 NOTE — ED Notes (Signed)
Wound wrapped in gauze and 4x4

## 2021-01-07 NOTE — ED Provider Notes (Signed)
[facility] EMERGENCY DEPARTMENT Provider Note   CSN: [id number] Arrival date & time: 01/07/21  1820     History Chief Complaint  Patient presents with  . Fall    [name] is a 87 y.o. male brought in by EMS for evaluation of fall.  Patient was walking his dog when the dog pulled on the leash, causing him to lose balance backwards.  He hit the back of his head on a curb.  No LOC.  He is not on blood thinners.  He has been ambulatory since this happened.  Patient states that he did not have any chest pain or dizziness that preceded his fall.  He states that the dog pulled him and this caused him to lose his balance, fall.  He does state that he feels like his balance is gotten worse over the last year or so.  He denies any numbness/weakness.  He denies any chest pain, difficulty breathing, abdominal pain, nausea/vomiting.  He does not know when his last tetanus shot was.  The history is provided by the patient and the EMS personnel.      Past Medical History:  Diagnosis Date  . Choledocholithiasis 2009   sepsis s/p ICU stay with VDRF  . Chronic fungal otitis externa 2015   Jenne Pane)  . Colon polyp    adenomatous - Ganem  . COPD (chronic obstructive pulmonary disease) (HCC)   . DDD (degenerative disc disease)    cervical and lumbar spine  . Depression with anxiety   . ED (erectile dysfunction)    on viagra  . Elevated PSA 2014   s/p normal biopsy 2003, now followed by Dr. Mena Goes, monitoring  . History of alcohol abuse 1989   after bad divorce  . History of smoking 2000   50+ PY hx, s/p normal CT chest 2014  . HTN (hypertension)   . OSA (obstructive sleep apnea)    could not tolerate CPAP  . Peyronie disease   . Presbycusis of both ears 2015   rec updated binaural amplification  . Wears hearing aid    [facility]    Patient Active Problem List   Diagnosis Date Noted  . Skin lesions 04/20/2020  . Hypercalcemia 04/20/2020  . Memory  deficit 03/19/2019  . Ejaculatory disorder 03/17/2018  . Abnormal drug screen 09/17/2017  . Positive test for herpes simplex virus (HSV) antibody 05/12/2015  . Health maintenance examination 03/11/2015  . Advanced care planning/counseling discussion 03/11/2015  . Macrocytosis without anemia 02/06/2015  . Presbycusis of both ears   . Medicare annual wellness visit, subsequent 02/10/2014  . Insomnia 11/25/2013  . HTN (hypertension)   . Elevated PSA   . History of smoking   . Alcohol dependence (HCC)   . COPD (chronic obstructive pulmonary disease) (HCC)     Past Surgical History:  Procedure Laterality Date  . CARDIAC CATHETERIZATION  2009   WNL per records, [place]  . CHOLECYSTECTOMY  2009  . COLONOSCOPY  2009   adenomatous polyp, rec rpt 5 yrs [name]  . COLONOSCOPY  07/2013   no polyps (Ganem)  . ECTROPION REPAIR Bilateral 04/2018   bilat lower eyelids  . hospitalization  2010   ERCP with sphincertotomy and CBD stone removal - septic cholangiis with EtOH withdrawal, E coli bacteremia with septic shock, with arterial thromboembolization with discoloration of toes of R foot  . rectal fistula repair  1984  . REFRACTIVE SURGERY Right 03/2019  . TONSILLECTOMY  1941  .  VASECTOMY  1969       Family History  Problem Relation Age of Onset  . Cancer Father        throat?  Marland Kitchen. Alcohol abuse Father   . Stroke Maternal Aunt        hemorrhagic  . Stroke Mother        ischemic  . Diabetes Neg Hx     Social History   Tobacco Use  . Smoking status: Former    Packs/day: 1.00    Years: 50.00    Pack years: 50.00    Types: Cigarettes    Start date: 07/31/1948    Quit date: 07/31/1998    Years since quitting: 22.4  . Smokeless tobacco: Never  Vaping Use  . Vaping Use: Never used  Substance Use Topics  . Alcohol use: Yes    Alcohol/week: 6.0 - 7.0 standard drinks    Types: 6 - 7 Glasses of wine per week  . Drug use: No    Home Medications Prior to Admission medications    Medication Sig Start Date End Date Taking? Authorizing Provider  Coenzyme Q10 (COQ-10) 50 MG CAPS Take by mouth daily.    [provider]  Ginkgo Biloba 40 MG TABS Take 2 tablets (80 mg total) by mouth daily. 04/08/19   Eustaquio BoydenGutierrez, Javier, MD  metoprolol tartrate (LOPRESSOR) 25 MG tablet Take 0.5 tablets (12.5 mg total) by mouth 2 (two) times daily. 04/19/20   Eustaquio BoydenGutierrez, Javier, MD  Milk Thistle Extract 175 MG TABS Take 1.4286 tablets (250 mg total) by mouth 3 (three) times a week. 03/15/18   Eustaquio BoydenGutierrez, Javier, MD  Multiple Vitamin (MULTIVITAMIN) tablet Take 1 tablet by mouth daily.    [provider]  multivitamin-lutein (OCUVITE-LUTEIN) CAPS capsule Take 1 capsule by mouth daily.    [provider]  Naproxen Sod-diphenhydrAMINE (ALEVE PM) 220-25 MG TABS Take 1 tablet by mouth at bedtime as needed (insomnia). 03/19/19   Eustaquio BoydenGutierrez, Javier, MD  POTASSIUM PO Take 500 mg by mouth. Takes 3 times weekly    [provider]  triamterene-hydrochlorothiazide (MAXZIDE-25) 37.5-25 MG tablet TAKE 1 TABLET BY MOUTH EVERY OTHER DAY 09/06/20   Eustaquio BoydenGutierrez, Javier, MD  vitamin k 100 MCG tablet Take 1 tablet (100 mcg total) by mouth daily. 03/14/18   Eustaquio BoydenGutierrez, Javier, MD  Zn-Pyg Afri-Nettle-Saw Palmet (SAW PALMETTO COMPLEX PO) Take 250 mg by mouth every other day.     [provider]    Allergies    Sulfa antibiotics  Review of Systems   Review of Systems  Constitutional:  Negative for fever.  Respiratory:  Negative for cough and shortness of breath.   Cardiovascular:  Negative for chest pain.  Gastrointestinal:  Negative for abdominal pain, nausea and vomiting.  Musculoskeletal:  Negative for back pain and neck pain.  Skin:  Positive for wound.  Neurological:  Negative for weakness, numbness and headaches.   Physical Exam Updated Vital Signs BP 121/74   Pulse 77   Temp 98.5 F (36.9 C) (Temporal)   Resp 18   Ht 5\' 6"  (1.676 m)   Wt 80.7 kg   SpO2 98%   BMI  28.73 kg/m   Physical Exam Vitals and nursing note reviewed.  Constitutional:      Appearance: Normal appearance. He is well-developed.  HENT:     Head: Normocephalic and atraumatic.   Eyes:     General: Lids are normal.     Conjunctiva/sclera: Conjunctivae normal.     Pupils: Pupils are  equal, round, and reactive to light.     Comments: PERRL. EOMs intact. No nystagmus. No neglect.   Neck:     Comments: Full flexion/extension and lateral movement of neck fully intact. No bony midline tenderness. No deformities or crepitus.  Cardiovascular:     Rate and Rhythm: Normal rate and regular rhythm.     Pulses: Normal pulses.     Heart sounds: Normal heart sounds. No murmur heard.   No friction rub. No gallop.  Pulmonary:     Effort: Pulmonary effort is normal.     Breath sounds: Normal breath sounds.     Comments: Lungs clear to auscultation bilaterally.  Symmetric chest rise.  No wheezing, rales, rhonchi. Chest:     Comments: No anterior chest wall tenderness.  No deformity or crepitus noted.  No evidence of flail chest. Abdominal:     Palpations: Abdomen is soft. Abdomen is not rigid.     Tenderness: There is no abdominal tenderness. There is no guarding.     Comments: Abdomen is soft, non-distended, non-tender. No rigidity, No guarding. No peritoneal signs.  Musculoskeletal:        General: Normal range of motion.     Cervical back: Full passive range of motion without pain.     Comments: No pelvic instability. No midline T or L spine tenderness. No deformity or step offs noted. No tenderness to palpation to bilateral shoulders, clavicles, elbows, and wrists. No deformities or crepitus noted. FROM of BUE without difficulty. No tenderness to palpation to bilateral knees and ankles. No deformities or crepitus noted. FROM of BLE without any difficulty.   Skin:    General: Skin is warm and dry.     Capillary Refill: Capillary refill takes less than 2 seconds.  Neurological:      Mental Status: He is alert and oriented to person, place, and time.     Comments: Cranial nerves III-XII intact Follows commands, Moves all extremities  5/5 strength to BUE and BLE  Sensation intact throughout all major nerve distributions No slurred speech. No facial droop.   Psychiatric:        Speech: Speech normal.    ED Results / Procedures / Treatments   Labs (all labs ordered are listed, but only abnormal results are displayed) Labs Reviewed - No data to display  EKG None  Radiology CT Head Wo Contrast  Result Date: 01/07/2021 CLINICAL DATA:  Larey Seat and hit the back of his head. EXAM: CT HEAD WITHOUT CONTRAST CT CERVICAL SPINE WITHOUT CONTRAST TECHNIQUE: Multidetector CT imaging of the head and cervical spine was performed following the standard protocol without intravenous contrast. Multiplanar CT image reconstructions of the cervical spine were also generated. COMPARISON:  None. FINDINGS: CT HEAD FINDINGS Brain: No evidence of acute infarction, hemorrhage, hydrocephalus, extra-axial collection or mass lesion/mass effect. Mild-to-moderate generalized cerebral atrophy, within normal limits for age. Scattered mild periventricular and subcortical white matter hypodensities are nonspecific, but favored to reflect chronic microvascular ischemic changes. Vascular: Atherosclerotic vascular calcification of the carotid siphons. No hyperdense vessel. Skull: Normal. Negative for fracture or focal lesion. Sinuses/Orbits: No acute finding. Slightly expansile retention cyst in the posterior right ethmoid air cells with thinning of the adjacent lamina papyracea. Other: Large right parietal scalp hematoma. CT CERVICAL SPINE FINDINGS Alignment: Mild reversal of the normal cervical lordosis. No traumatic malalignment. Skull base and vertebrae: No acute fracture. No primary bone lesion or focal pathologic process. Soft tissues and spinal canal: No prevertebral fluid or swelling. No  visible canal hematoma.  Disc levels: Moderate disc height loss and uncovertebral hypertrophy at C5-C6 and C6-C7. Upper chest: Negative. Other: None. IMPRESSION: 1. No acute intracranial abnormality. Large right parietal scalp hematoma. 2. No acute cervical spine fracture or traumatic malalignment. Electronically Signed   By: Obie Dredge M.D.   On: 01/07/2021 19:59   CT Cervical Spine Wo Contrast  Result Date: 01/07/2021 CLINICAL DATA:  Larey Seat and hit the back of his head. EXAM: CT HEAD WITHOUT CONTRAST CT CERVICAL SPINE WITHOUT CONTRAST TECHNIQUE: Multidetector CT imaging of the head and cervical spine was performed following the standard protocol without intravenous contrast. Multiplanar CT image reconstructions of the cervical spine were also generated. COMPARISON:  None. FINDINGS: CT HEAD FINDINGS Brain: No evidence of acute infarction, hemorrhage, hydrocephalus, extra-axial collection or mass lesion/mass effect. Mild-to-moderate generalized cerebral atrophy, within normal limits for age. Scattered mild periventricular and subcortical white matter hypodensities are nonspecific, but favored to reflect chronic microvascular ischemic changes. Vascular: Atherosclerotic vascular calcification of the carotid siphons. No hyperdense vessel. Skull: Normal. Negative for fracture or focal lesion. Sinuses/Orbits: No acute finding. Slightly expansile retention cyst in the posterior right ethmoid air cells with thinning of the adjacent lamina papyracea. Other: Large right parietal scalp hematoma. CT CERVICAL SPINE FINDINGS Alignment: Mild reversal of the normal cervical lordosis. No traumatic malalignment. Skull base and vertebrae: No acute fracture. No primary bone lesion or focal pathologic process. Soft tissues and spinal canal: No prevertebral fluid or swelling. No visible canal hematoma. Disc levels: Moderate disc height loss and uncovertebral hypertrophy at C5-C6 and C6-C7. Upper chest: Negative. Other: None. IMPRESSION: 1. No acute  intracranial abnormality. Large right parietal scalp hematoma. 2. No acute cervical spine fracture or traumatic malalignment. Electronically Signed   By: Obie Dredge M.D.   On: 01/07/2021 19:59    Procedures .Marland KitchenLaceration Repair  Date/Time: 01/07/2021 8:11 PM Performed by: Maxwell Caul, PA-C Authorized by: Maxwell Caul, PA-C   Consent:    Consent obtained:  Verbal   Consent given by:  Patient   Risks discussed:  Infection, need for additional repair, pain, poor cosmetic result and poor wound healing   Alternatives discussed:  No treatment and delayed treatment Universal protocol:    Procedure explained and questions answered to patient or proxy's satisfaction: yes     Relevant documents present and verified: yes     Test results available: yes     Imaging studies available: yes     Required blood products, implants, devices, and special equipment available: yes     Site/side marked: yes     Immediately prior to procedure, a time out was called: yes     Patient identity confirmed:  Verbally with patient Anesthesia:    Anesthesia method:  Local infiltration   Local anesthetic:  Lidocaine 1% WITH epi Laceration details:    Location:  Scalp   Scalp location:  R parietal   Length (cm):  3 Pre-procedure details:    Preparation:  Patient was prepped and draped in usual sterile fashion Exploration:    Hemostasis achieved with:  Direct pressure   Imaging outcome: foreign body not noted     Wound exploration: wound explored through full range of motion     Contaminated: no   Treatment:    Area cleansed with:  Saline   Amount of cleaning:  Extensive   Irrigation solution:  Sterile saline   Irrigation method:  Syringe   Visualized foreign bodies/material removed: no  Debridement:  None Skin repair:    Repair method:  Staples   Number of staples:  7 Approximation:    Approximation:  Close Repair type:    Repair type:  Simple Post-procedure details:    Dressing:   Non-adherent dressing Comments:     The wound was thoroughly extensively irrigated with a liter of sterile saline.  Line with epi was used to fully help control bleeding.  On my evaluation, there was no foreign bodies. No dirt and debris.  The area was brought together using staples.   Medications Ordered in ED Medications  Tdap (BOOSTRIX) injection 0.5 mL (0.5 mLs Intramuscular Given 01/07/21 2021)  lidocaine-EPINEPHrine (XYLOCAINE W/EPI) 1 %-1:100000 (with pres) injection 10 mL (10 mLs Intradermal Given by Other 01/07/21 2010)    ED Course  I have reviewed the triage vital signs and the nursing notes.  Pertinent labs & imaging results that were available during my care of the patient were reviewed by me and considered in my medical decision making (see chart for details).    MDM Rules/Calculators/A&P                          87 year old male who presents via EMS for evaluation of fall.  He reports he was walking his dog when his dog pulled, causing him to lose balance and fall backwards, hitting his head against the curb.  No LOC.  He is not on blood thinners.  He is not complaining of any pain.  He reports that his legs give out on him but this is been ongoing for a year.  He has not followed with his doctor about it.  He states today, he did not have any dizziness or lightheadedness that made him fall.  On initially arrival, he is afebrile, toxic appearing.  Vital signs are stable.  On exam, he has a large posterior scalp hematoma with laceration.  Exam otherwise unremarkable.  No neurodeficits.  Given fall, trauma, will plan for imaging of head and neck.  CT of head and neck showed no acute intracranial normality.  There is a large right parietal scalp hematoma.  There is no acute cervical spine fracture or traumatic alignment.  Laceration repaired as documented above.  Patient tolerated procedure well.  Encouraged at home supportive care measures.  Patient instructed to follow-up with  his primary care doctor.  Patient ambulatory here in the ED.  At this time, patient exhibits no emergent life-threatening condition that require further evaluation in ED. Patient had ample opportunity for questions and discussion. All patient's questions were answered with full understanding. Strict return precautions discussed. Patient expresses understanding and agreement to plan.   Portions of this note were generated with Scientist, clinical (histocompatibility and immunogenetics). Dictation errors may occur despite best attempts at proofreading.   Final Clinical Impression(s) / ED Diagnoses Final diagnoses:  Fall, initial encounter  Laceration of scalp, initial encounter    Rx / DC Orders ED Discharge Orders     None        Rosana Hoes 01/07/21 2157    Gwyneth Sprout, MD 01/09/21 1559

## 2021-01-07 NOTE — Discharge Instructions (Addendum)
Your CT of your head and neck were reassuring. You do have a hematoma on the right back head. You can apply ice to help with swelling.   Keep the wound clean and dry for the first 24 hours. After that you may gently clean the wound with soap and water. Make sure to pat dry the wound before covering it with any dressing. You can use topical antibiotic ointment and bandage. Ice and elevate for pain relief.   You can take 1000 mg of Tylenol.  Do not exceed 4000 mg of Tylenol a day.  Return to the Emergency Department, your primary care doctor, or the [facility] in 7-10 days for suture removal.   Please follow up closely with your doctor. Please call him and tell him of your emergency dept visit. He may want to see you sooner than August.   Monitor closely for any signs of infection. Return to the Emergency Department for any worsening redness/swelling of the area that begins to spread, drainage from the site, worsening pain, fever or any other worsening or concerning symptoms.

## 2021-01-07 NOTE — ED Triage Notes (Signed)
Pt arrived to ED via EMS from home after pt had a mechanical fall. Pt was walking his dog when the dog pulled on the leash and pt lost balance. Pt fell backwards and his the posterior region of his head on a curb. EMS reports large hematoma and large lac that they applied pressure to for about 15 minutes to control bleeding. Pt denies any pain, LOC, any other complaints. VSS w/ EMS

## 2021-01-07 NOTE — ED Notes (Signed)
Pt ambulated in room and hallway well, independently

## 2021-01-08 ENCOUNTER — Encounter: Payer: Self-pay | Admitting: Family Medicine

## 2021-01-17 ENCOUNTER — Encounter (HOSPITAL_COMMUNITY): Payer: Self-pay | Admitting: *Deleted

## 2021-01-17 ENCOUNTER — Emergency Department (HOSPITAL_COMMUNITY)
Admission: EM | Admit: 2021-01-17 | Discharge: 2021-01-17 | Disposition: A | Discharge status: Home / Self Care | Payer: Medicare Other | Attending: Emergency Medicine | Admitting: Emergency Medicine

## 2021-01-17 ENCOUNTER — Other Ambulatory Visit: Payer: Self-pay

## 2021-01-17 DIAGNOSIS — Z79899 Other long term (current) drug therapy: Secondary | ICD-10-CM | POA: Insufficient documentation

## 2021-01-17 DIAGNOSIS — J449 Chronic obstructive pulmonary disease, unspecified: Secondary | ICD-10-CM | POA: Insufficient documentation

## 2021-01-17 DIAGNOSIS — Z4802 Encounter for removal of sutures: Secondary | ICD-10-CM | POA: Diagnosis not present

## 2021-01-17 DIAGNOSIS — Z87891 Personal history of nicotine dependence: Secondary | ICD-10-CM | POA: Diagnosis not present

## 2021-01-17 DIAGNOSIS — I10 Essential (primary) hypertension: Secondary | ICD-10-CM | POA: Insufficient documentation

## 2021-01-17 DIAGNOSIS — S0101XD Laceration without foreign body of scalp, subsequent encounter: Secondary | ICD-10-CM | POA: Diagnosis not present

## 2021-01-17 NOTE — Discharge Instructions (Addendum)
Continue to clean the wound with warm soapy water and dry.  Apply antibiotic ointment twice daily.

## 2021-01-17 NOTE — ED Triage Notes (Signed)
Pt here for suture removal posterior head, no other complaints.

## 2021-01-17 NOTE — ED Provider Notes (Signed)
[facility] EMERGENCY DEPARTMENT Provider Note   CSN: [id number] Arrival date & time: 01/17/21  8921     History Chief Complaint  Patient presents with   Suture / Staple Removal    [name] is a 87 y.o. male.  HPI Call 6\10.  Tripped over her dog's leash.  Here for suture removal.  No headache or problems.  He wound healing well.    Past Medical History:  Diagnosis Date   Choledocholithiasis 2009   sepsis s/p ICU stay with VDRF   Chronic fungal otitis externa 2015   Jenne Pane)   Colon polyp    adenomatous - [place]   COPD (chronic obstructive pulmonary disease) (HCC)    DDD (degenerative disc disease)    cervical and lumbar spine   Depression with anxiety    ED (erectile dysfunction)    on viagra   Elevated PSA 2014   s/p normal biopsy 2003, now followed by Dr. Mena Goes, monitoring   History of alcohol abuse 1989   after bad divorce   History of smoking 2000   50+ PY hx, s/p normal CT chest 2014   HTN (hypertension)    OSA (obstructive sleep apnea)    could not tolerate CPAP   Peyronie disease    Presbycusis of both ears 2015   rec updated binaural amplification   Wears hearing aid    [employer]    Patient Active Problem List   Diagnosis Date Noted   Skin lesions 04/20/2020   Hypercalcemia 04/20/2020   Memory deficit 03/19/2019   Ejaculatory disorder 03/17/2018   Abnormal drug screen 09/17/2017   Positive test for herpes simplex virus (HSV) antibody 05/12/2015   Health maintenance examination 03/11/2015   Advanced care planning/counseling discussion 03/11/2015   Macrocytosis without anemia 02/06/2015   Presbycusis of both ears    Medicare annual wellness visit, subsequent 02/10/2014   Insomnia 11/25/2013   HTN (hypertension)    Elevated PSA    History of smoking    Alcohol dependence (HCC)    COPD (chronic obstructive pulmonary disease) (HCC)     Past Surgical History:  Procedure Laterality Date   CARDIAC  CATHETERIZATION  2009   WNL per records, [place]   CHOLECYSTECTOMY  2009   COLONOSCOPY  2009   adenomatous polyp, rec rpt 5 yrs ([place])   COLONOSCOPY  07/2013   no polyps ([place])   ECTROPION REPAIR Bilateral 04/2018   bilat lower eyelids   hospitalization  2010   ERCP with sphincertotomy and CBD stone removal - septic cholangiis with EtOH withdrawal, E coli bacteremia with septic shock, with arterial thromboembolization with discoloration of toes of R foot   rectal fistula repair  1984   REFRACTIVE SURGERY Right 03/2019   TONSILLECTOMY  1941   VASECTOMY  1969       Family History  Problem Relation Age of Onset   Cancer Father        throat?   Alcohol abuse Father    Stroke Maternal Aunt        hemorrhagic   Stroke Mother        ischemic   Diabetes Neg Hx     Social History   Tobacco Use   Smoking status: Former    Packs/day: 1.00    Years: 50.00    Pack years: 50.00    Types: Cigarettes    Start date: 07/31/1948    Quit date: 07/31/1998    Years since quitting: 22.4  Smokeless tobacco: Never  Vaping Use   Vaping Use: Never used  Substance Use Topics   Alcohol use: Yes    Alcohol/week: 6.0 - 7.0 standard drinks    Types: 6 - 7 Glasses of wine per week   Drug use: No    Home Medications Prior to Admission medications   Medication Sig Start Date End Date Taking? Authorizing Provider  Coenzyme Q10 (COQ-10) 50 MG CAPS Take by mouth daily.    [provider]  Ginkgo Biloba 40 MG TABS Take 2 tablets (80 mg total) by mouth daily. 04/08/19   Eustaquio Boyden, MD  metoprolol tartrate (LOPRESSOR) 25 MG tablet Take 0.5 tablets (12.5 mg total) by mouth 2 (two) times daily. 04/19/20   Eustaquio Boyden, MD  Milk Thistle Extract 175 MG TABS Take 1.4286 tablets (250 mg total) by mouth 3 (three) times a week. 03/15/18   Eustaquio Boyden, MD  Multiple Vitamin (MULTIVITAMIN) tablet Take 1 tablet by mouth daily.    [provider]  multivitamin-lutein  (OCUVITE-LUTEIN) CAPS capsule Take 1 capsule by mouth daily.    [provider]  Naproxen Sod-diphenhydrAMINE (ALEVE PM) 220-25 MG TABS Take 1 tablet by mouth at bedtime as needed (insomnia). 03/19/19   Eustaquio Boyden, MD  POTASSIUM PO Take 500 mg by mouth. Takes 3 times weekly    [provider]  triamterene-hydrochlorothiazide (MAXZIDE-25) 37.5-25 MG tablet TAKE 1 TABLET BY MOUTH EVERY OTHER DAY 09/06/20   Eustaquio Boyden, MD  vitamin k 100 MCG tablet Take 1 tablet (100 mcg total) by mouth daily. 03/14/18   Eustaquio Boyden, MD  Zn-Pyg Afri-Nettle-Saw Palmet (SAW PALMETTO COMPLEX PO) Take 250 mg by mouth every other day.     [provider]    Allergies    Sulfa antibiotics  Review of Systems   Review of Systems Contusional: No fever no chills Musculoskeletal: No neck pain or stiffness Physical Exam Updated Vital Signs BP (!) 153/77 (BP Location: Right Arm)   Pulse 84   Temp (!) 97.5 F (36.4 C) (Oral)   Resp 18   SpO2 100%   Physical Exam Constitutional:      Appearance: Normal appearance.  HENT:     Head:     Comments: The laceration to the scalp with 7 staples in place..  Clean and dry. Eyes:     Extraocular Movements: Extraocular movements intact.  Pulmonary:     Effort: Pulmonary effort is normal.  Skin:    General: Skin is warm and dry.  Neurological:     General: No focal deficit present.     Mental Status: He is alert and oriented to person, place, and time.  Psychiatric:        Mood and Affect: Mood normal.    ED Results / Procedures / Treatments   Labs (all labs ordered are listed, but only abnormal results are displayed) Labs Reviewed - No data to display  EKG None  Radiology No results found.  Procedures .Suture Removal  Date/Time: 01/17/2021 9:44 AM Performed by: Arby Barrette, MD Authorized by: Arby Barrette, MD   Consent:    Consent obtained:  Verbal   Consent given by:  Patient   Risks discussed:   Bleeding, pain and wound separation Universal protocol:    Patient identity confirmed:  Arm band Location:    Location:  Head/neck   Head/neck location:  Scalp Procedure details:    Wound appearance:  No signs of infection and good wound healing   Number of  staples removed:  7 Post-procedure details:    Post-removal:  Antibiotic ointment applied and dressing applied   Medications Ordered in ED Medications - No data to display  ED Course  I have reviewed the triage vital signs and the nursing notes.  Pertinent labs & imaging results that were available during my care of the patient were reviewed by me and considered in my medical decision making (see chart for details).    MDM Rules/Calculators/A&P                    Final Clinical Impression(s) / ED Diagnoses Final diagnoses:  Removal of staples    Rx / DC Orders ED Discharge Orders     None        Arby Barrette, MD 01/17/21 [phone]

## 2021-01-26 DIAGNOSIS — M545 Low back pain, unspecified: Secondary | ICD-10-CM | POA: Diagnosis not present

## 2021-03-15 DIAGNOSIS — M4800 Spinal stenosis, site unspecified: Secondary | ICD-10-CM | POA: Diagnosis not present

## 2021-03-15 DIAGNOSIS — R269 Unspecified abnormalities of gait and mobility: Secondary | ICD-10-CM | POA: Diagnosis not present

## 2021-03-15 DIAGNOSIS — R296 Repeated falls: Secondary | ICD-10-CM | POA: Diagnosis not present

## 2021-03-18 DIAGNOSIS — R296 Repeated falls: Secondary | ICD-10-CM | POA: Diagnosis not present

## 2021-03-18 DIAGNOSIS — R269 Unspecified abnormalities of gait and mobility: Secondary | ICD-10-CM | POA: Diagnosis not present

## 2021-03-18 DIAGNOSIS — M4800 Spinal stenosis, site unspecified: Secondary | ICD-10-CM | POA: Diagnosis not present

## 2021-03-25 DIAGNOSIS — M4800 Spinal stenosis, site unspecified: Secondary | ICD-10-CM | POA: Diagnosis not present

## 2021-03-25 DIAGNOSIS — R296 Repeated falls: Secondary | ICD-10-CM | POA: Diagnosis not present

## 2021-03-25 DIAGNOSIS — R269 Unspecified abnormalities of gait and mobility: Secondary | ICD-10-CM | POA: Diagnosis not present

## 2021-03-28 DIAGNOSIS — M4800 Spinal stenosis, site unspecified: Secondary | ICD-10-CM | POA: Diagnosis not present

## 2021-04-05 DIAGNOSIS — R269 Unspecified abnormalities of gait and mobility: Secondary | ICD-10-CM | POA: Diagnosis not present

## 2021-04-05 DIAGNOSIS — R296 Repeated falls: Secondary | ICD-10-CM | POA: Diagnosis not present

## 2021-04-05 DIAGNOSIS — M4800 Spinal stenosis, site unspecified: Secondary | ICD-10-CM | POA: Diagnosis not present

## 2021-04-10 ENCOUNTER — Other Ambulatory Visit: Payer: Self-pay | Admitting: Family Medicine

## 2021-04-10 DIAGNOSIS — I10 Essential (primary) hypertension: Secondary | ICD-10-CM

## 2021-04-10 DIAGNOSIS — D7589 Other specified diseases of blood and blood-forming organs: Secondary | ICD-10-CM

## 2021-04-11 ENCOUNTER — Other Ambulatory Visit: Payer: Medicare Other

## 2021-04-13 DIAGNOSIS — M4800 Spinal stenosis, site unspecified: Secondary | ICD-10-CM | POA: Diagnosis not present

## 2021-04-13 DIAGNOSIS — R296 Repeated falls: Secondary | ICD-10-CM | POA: Diagnosis not present

## 2021-04-13 DIAGNOSIS — R269 Unspecified abnormalities of gait and mobility: Secondary | ICD-10-CM | POA: Diagnosis not present

## 2021-04-15 ENCOUNTER — Encounter: Payer: Self-pay | Admitting: Family Medicine

## 2021-04-20 ENCOUNTER — Encounter: Payer: Self-pay | Admitting: Family Medicine

## 2021-04-20 NOTE — Telephone Encounter (Signed)
Updated pt's chart.  

## 2021-04-22 ENCOUNTER — Other Ambulatory Visit: Payer: Self-pay

## 2021-04-22 ENCOUNTER — Encounter: Payer: Self-pay | Admitting: Family Medicine

## 2021-04-22 ENCOUNTER — Ambulatory Visit (INDEPENDENT_AMBULATORY_CARE_PROVIDER_SITE_OTHER): Payer: Medicare Other | Admitting: Family Medicine

## 2021-04-22 VITALS — BP 120/68 | HR 67 | Temp 97.5°F | Ht 65.75 in | Wt 172.2 lb

## 2021-04-22 DIAGNOSIS — R2681 Unsteadiness on feet: Secondary | ICD-10-CM | POA: Insufficient documentation

## 2021-04-22 DIAGNOSIS — I10 Essential (primary) hypertension: Secondary | ICD-10-CM | POA: Diagnosis not present

## 2021-04-22 DIAGNOSIS — F1020 Alcohol dependence, uncomplicated: Secondary | ICD-10-CM

## 2021-04-22 DIAGNOSIS — R296 Repeated falls: Secondary | ICD-10-CM | POA: Insufficient documentation

## 2021-04-22 DIAGNOSIS — Z0000 Encounter for general adult medical examination without abnormal findings: Secondary | ICD-10-CM

## 2021-04-22 DIAGNOSIS — Z87891 Personal history of nicotine dependence: Secondary | ICD-10-CM

## 2021-04-22 DIAGNOSIS — Z7189 Other specified counseling: Secondary | ICD-10-CM

## 2021-04-22 DIAGNOSIS — D7589 Other specified diseases of blood and blood-forming organs: Secondary | ICD-10-CM

## 2021-04-22 DIAGNOSIS — J449 Chronic obstructive pulmonary disease, unspecified: Secondary | ICD-10-CM

## 2021-04-22 DIAGNOSIS — R413 Other amnesia: Secondary | ICD-10-CM

## 2021-04-22 DIAGNOSIS — D649 Anemia, unspecified: Secondary | ICD-10-CM | POA: Diagnosis not present

## 2021-04-22 LAB — LIPID PANEL
Cholesterol: 165 mg/dL (ref 0–200)
HDL: 80.7 mg/dL (ref >39.00)
LDL Cholesterol: 74 mg/dL (ref 0–99)
NonHDL: 84.48
Total CHOL/HDL Ratio: 2
Triglycerides: 50 mg/dL (ref 0.0–149.0)
VLDL: 10 mg/dL (ref 0.0–40.0)

## 2021-04-22 LAB — MICROALBUMIN / CREATININE URINE RATIO
Creatinine,U: 100.5 mg/dL
Microalb Creat Ratio: 0.7 mg/g (ref 0.0–30.0)
Microalb, Ur: <0.7 mg/dL (ref 0.0–1.9)

## 2021-04-22 LAB — CBC WITH DIFFERENTIAL/PLATELET
Basophils Absolute: 0 10*3/uL (ref 0.0–0.1)
Basophils Relative: 0.4 % (ref 0.0–3.0)
Eosinophils Absolute: 0.1 10*3/uL (ref 0.0–0.7)
Eosinophils Relative: 1.2 % (ref 0.0–5.0)
HCT: 36 % — ABNORMAL LOW (ref 39.0–52.0)
Hemoglobin: 12.4 g/dL — ABNORMAL LOW (ref 13.0–17.0)
Lymphocytes Relative: 21.5 % (ref 12.0–46.0)
Lymphs Abs: 1.4 10*3/uL (ref 0.7–4.0)
MCHC: 34.3 g/dL (ref 30.0–36.0)
MCV: 105.2 fl — ABNORMAL HIGH (ref 78.0–100.0)
Monocytes Absolute: 0.7 10*3/uL (ref 0.1–1.0)
Monocytes Relative: 10.6 % (ref 3.0–12.0)
Neutro Abs: 4.3 10*3/uL (ref 1.4–7.7)
Neutrophils Relative %: 66.3 % (ref 43.0–77.0)
Platelets: 208 10*3/uL (ref 150.0–400.0)
RBC: 3.42 Mil/uL — ABNORMAL LOW (ref 4.22–5.81)
RDW: 13.3 % (ref 11.5–15.5)
WBC: 6.4 10*3/uL (ref 4.0–10.5)

## 2021-04-22 LAB — VITAMIN B12: Vitamin B-12: 836 pg/mL (ref 211–911)

## 2021-04-22 LAB — COMPREHENSIVE METABOLIC PANEL
ALT: 15 U/L (ref 0–53)
AST: 17 U/L (ref 0–37)
Albumin: 4.1 g/dL (ref 3.5–5.2)
Alkaline Phosphatase: 52 U/L (ref 39–117)
BUN: 13 mg/dL (ref 6–23)
CO2: 28 mEq/L (ref 19–32)
Calcium: 9.8 mg/dL (ref 8.4–10.5)
Chloride: 94 mEq/L — ABNORMAL LOW (ref 96–112)
Creatinine, Ser: 0.93 mg/dL (ref 0.40–1.50)
GFR: 74.64 mL/min (ref >60.00)
Glucose, Bld: 97 mg/dL (ref 70–99)
Potassium: 4 mEq/L (ref 3.5–5.1)
Sodium: 132 mEq/L — ABNORMAL LOW (ref 135–145)
Total Bilirubin: 0.6 mg/dL (ref 0.2–1.2)
Total Protein: 6.6 g/dL (ref 6.0–8.3)

## 2021-04-22 LAB — TSH: TSH: 2.66 u[IU]/mL (ref 0.35–5.50)

## 2021-04-22 MED ORDER — TRIAMTERENE-HCTZ 37.5-25 MG PO TABS: 1.0000 | ORAL_TABLET | ORAL | 3 refills | Status: DC

## 2021-04-22 NOTE — Assessment & Plan Note (Signed)
Suffered fall with scalp laceration s/p ER stapling 11/2020 - quit drinking after this  Undergoing outpatient PT currently

## 2021-04-22 NOTE — Assessment & Plan Note (Signed)
Chronic, stable on maxzide QOD - refilled today.

## 2021-04-22 NOTE — Progress Notes (Signed)
Patient ID: [name], male    DOB: 1932-08-04, 87 y.o.   MRN: [id number]  This visit was conducted in person.  BP 120/68   Pulse 67   Temp (!) 97.5 F (36.4 C) (Temporal)   Ht 5' 5.75" (1.67 m)   Wt 172 lb 3 oz (78.1 kg)   SpO2 99%   BMI 28.00 kg/m    CC: AMW/CPE Subjective:   HPI: [name] is a 87 y.o. male presenting on 04/22/2021 for Medicare Wellness   Did not see health advisor this year.   Hearing Screening - Comments:: Wears bilateral hearing aids.  Wearing at today's OV.  Vision Screening - Comments:: Last eye exam, 07/2020.  Flowsheet Row Office Visit from 04/22/2021 in [facility] [place]  PHQ-2 Total Score 1       Fall Risk  04/22/2021 03/19/2019 03/12/2018 02/21/2017 02/15/2016  Falls in the past year? 1 0 No No No  Number falls in past yr: 1 - - - -  Injury with Fall? 1 - - - -  Comment Head injury - - - -  11/2020 - seen at ER with scalp laceration which has healed well - no further falls since. Skin to area was denuded but has since healed well. He's stopped drinking since May 15th (fall) with resultant weight loss.   Undergoing PT with some benefit for leg pains. Cane recommended, pt not using. Denies claudication symptoms.   Wife has Parkinson's disease and has had mental illness.  Preventative: COLONOSCOPY Date: 07/2013 no polyps [name]  Prostate - elevated PSA in past with normal biopsy per patient - this was followed by Dr. Mena Goes in past Q6 mo active surveillance. Has not returned since 01/2013. Does not want to return to uro. Latest labs PSA had normalized. will age out. Denies prostate symptoms.  Lung cancer screening - not eligible Flu shot - yearly Tetanus 2010, Tdap 2012 Pneumovax 07/2002. Prevnar-13: 01/2014 zostavax 2008 COVID vaccine - Pfizer 08/2019, 09/2019 - booster 09/2020, new booster 03/2021 Zostavax 2008 Shingrix - discussed, declines  Advanced directives: has this at home. Wife [name] would HCPOA. States  [facility] has this - I advised I don't see it in his chart. Declines bringing [place] a copy.  Seat belt use discussed  Sunscreen use discussed. No changing moles on skin.  Ex-smoker - quit 2000 (50 PY hx) Alcohol - drank 16 oz chardonnay per day. quit 4 months ago - attributes weight loss to stopping alcohol. Has quit previously, has never had withdrawals from alcohol.  Dentist - Q4 mo  Eye exam - yearly  Bowel - no constipation  Bladder - no incontinence    Lives with [name] wife (married since 2005)  Occupation: Medical sales representative, retired  Edu: 18+ yrs  Activity: walking 2 miles a day  Diet: good water, fruits/vegetables daily. Following mediterranean diet. Drinks unsweet tea, avoid soft drinks.      Relevant past medical, surgical, family and social history reviewed and updated as indicated. Interim medical history since our last visit reviewed. Allergies and medications reviewed and updated. Outpatient Medications Prior to Visit  Medication Sig Dispense Refill   Coenzyme Q10 (COQ-10) 50 MG CAPS Take by mouth daily.     Ginkgo Biloba 40 MG TABS Take 2 tablets (80 mg total) by mouth daily.     metoprolol tartrate (LOPRESSOR) 25 MG tablet Take 0.5 tablets (12.5 mg total) by mouth 2 (two) times daily. 90 tablet 3   Milk Thistle  Extract 175 MG TABS Take 1.4286 tablets (250 mg total) by mouth 3 (three) times a week.     Multiple Vitamin (MULTIVITAMIN) tablet Take 1 tablet by mouth daily.     multivitamin-lutein (OCUVITE-LUTEIN) CAPS capsule Take 1 capsule by mouth daily.     Naproxen Sod-diphenhydrAMINE (ALEVE PM) 220-25 MG TABS Take 1 tablet by mouth at bedtime as needed (insomnia).     POTASSIUM PO Take 500 mg by mouth. Takes 3 times weekly     vitamin k 100 MCG tablet Take 1 tablet (100 mcg total) by mouth daily.     Zn-Pyg Afri-Nettle-Saw Palmet (SAW PALMETTO COMPLEX PO) Take 250 mg by mouth every other day.      triamterene-hydrochlorothiazide (MAXZIDE-25) 37.5-25 MG tablet TAKE 1 TABLET BY MOUTH  EVERY OTHER DAY 45 tablet 1   No facility-administered medications prior to visit.     Per HPI unless specifically indicated in ROS section below Review of Systems  Constitutional:  Negative for activity change, appetite change, chills, fatigue, fever and unexpected weight change.  HENT:  Negative for hearing loss.   Eyes:  Negative for visual disturbance.  Respiratory:  Negative for cough, chest tightness, shortness of breath and wheezing.   Cardiovascular:  Negative for chest pain, palpitations and leg swelling.  Gastrointestinal:  Negative for abdominal distention, abdominal pain, blood in stool, constipation, diarrhea, nausea and vomiting.  Genitourinary:  Negative for difficulty urinating and hematuria.  Musculoskeletal:  Negative for arthralgias, myalgias and neck pain.       Leg pain - ankles and lower leg, now progressing into thighs and buttock - he saw ortho Dr Dion Saucier - due to lumbar radiculopathy, referred to PT   Skin:  Negative for rash.  Neurological:  Negative for dizziness, seizures, syncope and headaches.  Hematological:  Negative for adenopathy. Bruises/bleeds easily.  Psychiatric/Behavioral:  Positive for dysphoric mood (occasional). The patient is nervous/anxious (occasional).    Objective:  BP 120/68   Pulse 67   Temp (!) 97.5 F (36.4 C) (Temporal)   Ht 5' 5.75" (1.67 m)   Wt 172 lb 3 oz (78.1 kg)   SpO2 99%   BMI 28.00 kg/m   Wt Readings from Last 3 Encounters:  04/22/21 172 lb 3 oz (78.1 kg)  01/07/21 178 lb (80.7 kg)  04/19/20 183 lb (83 kg)      Physical Exam Vitals and nursing note reviewed.  Constitutional:      General: He is not in acute distress.    Appearance: Normal appearance. He is well-developed. He is not ill-appearing.  HENT:     Head: Normocephalic and atraumatic.     Right Ear: Hearing, tympanic membrane, ear canal and external ear normal.     Left Ear: Hearing, tympanic membrane and ear canal normal.     Ears:     Comments:  Cerumen in right ear Eyes:     General: No scleral icterus.    Extraocular Movements: Extraocular movements intact.     Conjunctiva/sclera: Conjunctivae normal.     Pupils: Pupils are equal, round, and reactive to light.  Neck:     Thyroid: No thyroid mass or thyromegaly.     Vascular: No carotid bruit.  Cardiovascular:     Rate and Rhythm: Normal rate and regular rhythm.     Pulses: Normal pulses.          Radial pulses are 2+ on the right side and 2+ on the left side.     Heart sounds: Normal  heart sounds. No murmur heard. Pulmonary:     Effort: Pulmonary effort is normal. No respiratory distress.     Breath sounds: Normal breath sounds. No wheezing, rhonchi or rales.  Abdominal:     General: Bowel sounds are normal. There is no distension.     Palpations: Abdomen is soft. There is no mass.     Tenderness: There is no abdominal tenderness. There is no guarding or rebound.     Hernia: No hernia is present.  Musculoskeletal:        General: Normal range of motion.     Cervical back: Normal range of motion and neck supple.     Right lower leg: Edema (1+) present.     Left lower leg: Edema (1+) present.     Comments: Pitting edema to ankles  Lymphadenopathy:     Cervical: No cervical adenopathy.  Skin:    General: Skin is warm and dry.     Findings: No rash.  Neurological:     General: No focal deficit present.     Mental Status: He is alert and oriented to person, place, and time.     Comments:  Recall 3/3 Calculation 3/5 DLOW Unsteady gait  Psychiatric:        Mood and Affect: Mood normal.        Behavior: Behavior normal.        Thought Content: Thought content normal.        Judgment: Judgment normal.      Results for orders placed or performed in visit on 04/19/20  Lipid panel  Result Value Ref Range   Cholesterol 162 0 - 200 mg/dL   Triglycerides 27.0 0.0 - 149.0 mg/dL   HDL 62.37 >62.83 mg/dL   VLDL 15.1 0.0 - 76.1 mg/dL   LDL Cholesterol 63 0 - 99 mg/dL    Total CHOL/HDL Ratio 2    NonHDL 73.41   Comprehensive metabolic panel  Result Value Ref Range   Sodium 133 (L) 135 - 145 mEq/L   Potassium 4.5 3.5 - 5.1 mEq/L   Chloride 94 (L) 96 - 112 mEq/L   CO2 31 19 - 32 mEq/L   Glucose, Bld 96 70 - 99 mg/dL   BUN 11 6 - 23 mg/dL   Creatinine, Ser 6.07 0.40 - 1.50 mg/dL   Total Bilirubin 0.6 0.2 - 1.2 mg/dL   Alkaline Phosphatase 61 39 - 117 U/L   AST 24 0 - 37 U/L   ALT 16 0 - 53 U/L   Total Protein 7.1 6.0 - 8.3 g/dL   Albumin 4.5 3.5 - 5.2 g/dL   GFR 37.10 >62.69 mL/min   Calcium 10.8 (H) 8.4 - 10.5 mg/dL  TSH  Result Value Ref Range   TSH 2.29 0.35 - 4.50 uIU/mL  Microalbumin / creatinine urine ratio  Result Value Ref Range   Microalb, Ur 1.3 0.0 - 1.9 mg/dL   Creatinine,U 485.4 mg/dL   Microalb Creat Ratio 1.1 0.0 - 30.0 mg/g  CBC with Differential/Platelet  Result Value Ref Range   WBC 6.7 4.0 - 10.5 K/uL   RBC 3.66 (L) 4.22 - 5.81 Mil/uL   Hemoglobin 13.5 13.0 - 17.0 g/dL   HCT 62.7 03.5 - 00.9 %   MCV 108.7 (H) 78.0 - 100.0 fl   MCHC 34.0 30.0 - 36.0 g/dL   RDW 38.1 82.9 - 93.7 %   Platelets 196.0 150.0 - 400.0 K/uL   Neutrophils Relative % 62.3 43.0 - 77.0 %  Lymphocytes Relative 24.6 12.0 - 46.0 %   Monocytes Relative 11.2 3.0 - 12.0 %   Eosinophils Relative 1.3 0.0 - 5.0 %   Basophils Relative 0.6 0.0 - 3.0 %   Neutro Abs 4.2 1.4 - 7.7 K/uL   Lymphs Abs 1.7 0.7 - 4.0 K/uL   Monocytes Absolute 0.8 0.1 - 1.0 K/uL   Eosinophils Absolute 0.1 0.0 - 0.7 K/uL   Basophils Absolute 0.0 0.0 - 0.1 K/uL  Pathologist smear review  Result Value Ref Range   Path Review    Vitamin B12  Result Value Ref Range   Vitamin B-12 1,098 (H) 211 - 911 pg/mL  Folate  Result Value Ref Range   Folate >24.8 >5.9 ng/mL  Hemoglobin A1c  Result Value Ref Range   Hgb A1c MFr Bld 5.5 4.6 - 6.5 %    Assessment & Plan:  This visit occurred during the SARS-CoV-2 public health emergency.  Safety protocols were in place, including screening  questions prior to the visit, additional usage of staff PPE, and extensive cleaning of exam room while observing appropriate contact time as indicated for disinfecting solutions.   Problem List Items Addressed This Visit     Medicare annual wellness visit, subsequent - Primary (Chronic)    I have personally reviewed the Medicare Annual Wellness questionnaire and have noted 1. The patient's medical and social history 2. Their use of alcohol, tobacco or illicit drugs 3. Their current medications and supplements 4. The patient's functional ability including ADL's, fall risks, home safety risks and hearing or visual impairment. Cognitive function has been assessed and addressed as indicated.  5. Diet and physical activity 6. Evidence for depression or mood disorders The patients weight, height, BMI have been recorded in the chart. I have made referrals, counseling and provided education to the patient based on review of the above and I have provided the pt with a written personalized care plan for preventive services. Provider list updated.. See scanned questionairre as needed for further documentation. Reviewed preventative protocols and updated unless pt declined.       Health maintenance examination (Chronic)    Preventative protocols reviewed and updated unless pt declined. Discussed healthy diet and lifestyle.       Advanced care planning/counseling discussion (Chronic)    Advanced directives: has this at home. Wife [name] would HCPOA. States [facility] has this - I advised I don't see it in his chart. Declines bringing [place] a copy.       HTN (hypertension)    Chronic, stable on maxzide QOD - refilled today.       Relevant Medications   triamterene-hydrochlorothiazide (MAXZIDE-25) 37.5-25 MG tablet   History of smoking    50+ PY hx, quit 20+ yrs ago.       Alcohol dependence (HCC)    States last drink was 11/2020 - decided to stop after suffered fall with scalp laceration s/p ER  visit.       COPD (chronic obstructive pulmonary disease) (HCC)    Asxs, off respiratory medication. Long smoking history.       Macrocytosis without anemia    Update labs today - anticipate improvement now he has quit alcohol.       Relevant Orders   Vitamin B12   Memory deficit    Overall stable limited memory assessment today.       Hypercalcemia    Update levels with PTH.       Relevant Orders   Vitamin D pnl(25-hydrxy+1,25-dihy)-bld  Unsteadiness on feet    Suffered fall with scalp laceration s/p ER stapling 11/2020 - quit drinking after this  Undergoing outpatient PT currently         Meds ordered this encounter  Medications   triamterene-hydrochlorothiazide (MAXZIDE-25) 37.5-25 MG tablet    Sig: Take 1 tablet by mouth every other day.    Dispense:  45 tablet    Refill:  3   Orders Placed This Encounter  Procedures   Vitamin B12   Vitamin D pnl(25-hydrxy+1,25-dihy)-bld    Patient instructions: Labs today  Continue working with PT on leg pain and balance.  Good to see you today Return as needed or in 1 year for next wellness visit/physical.   Follow up plan: Return in about 1 year (around 04/22/2022), or if symptoms worsen or fail to improve, for annual exam, prior fasting for blood work, medicare wellness visit.  Eustaquio Boyden, MD

## 2021-04-22 NOTE — Assessment & Plan Note (Signed)
States last drink was 11/2020 - decided to stop after suffered fall with scalp laceration s/p ER visit.

## 2021-04-22 NOTE — Assessment & Plan Note (Addendum)
50+ PY hx, quit 20+ yrs ago.

## 2021-04-22 NOTE — Assessment & Plan Note (Signed)
Asxs, off respiratory medication. Long smoking history.

## 2021-04-22 NOTE — Assessment & Plan Note (Signed)
Overall stable limited memory assessment today.

## 2021-04-22 NOTE — Assessment & Plan Note (Signed)
Advanced directives: has this at home. Wife [name] would HCPOA. States [facility] has this - I advised I don't see it in his chart. Declines bringing [place] a copy.

## 2021-04-22 NOTE — Assessment & Plan Note (Signed)
Update labs today - anticipate improvement now he has quit alcohol.

## 2021-04-22 NOTE — Assessment & Plan Note (Signed)
Preventative protocols reviewed and updated unless pt declined. Discussed healthy diet and lifestyle.  

## 2021-04-22 NOTE — Patient Instructions (Addendum)
Labs today  Continue working with PT on leg pain and balance.  Good to see you today Return as needed or in 1 year for next wellness visit/physical.   Health Maintenance After Age 85 After age 59, you are at a higher risk for certain long-term diseases and infections as well as injuries from falls. Falls are a major cause of broken bones and head injuries in people who are older than age 75. Getting regular preventive care can help to keep you healthy and well. Preventive care includes getting regular testing and making lifestyle changes as recommended by your health care provider. Talk with your health care provider about: Which screenings and tests you should have. A screening is a test that checks for a disease when you have no symptoms. A diet and exercise plan that is right for you. What should I know about screenings and tests to prevent falls? Screening and testing are the best ways to find a health problem early. Early diagnosis and treatment give you the best chance of managing medical conditions that are common after age 49. Certain conditions and lifestyle choices may make you more likely to have a fall. Your health care provider may recommend: Regular vision checks. Poor vision and conditions such as cataracts can make you more likely to have a fall. If you wear glasses, make sure to get your prescription updated if your vision changes. Medicine review. Work with your health care provider to regularly review all of the medicines you are taking, including over-the-counter medicines. Ask your health care provider about any side effects that may make you more likely to have a fall. Tell your health care provider if any medicines that you take make you feel dizzy or sleepy. Osteoporosis screening. Osteoporosis is a condition that causes the bones to get weaker. This can make the bones weak and cause them to break more easily. Blood pressure screening. Blood pressure changes and medicines to  control blood pressure can make you feel dizzy. Strength and balance checks. Your health care provider may recommend certain tests to check your strength and balance while standing, walking, or changing positions. Foot health exam. Foot pain and numbness, as well as not wearing proper footwear, can make you more likely to have a fall. Depression screening. You may be more likely to have a fall if you have a fear of falling, feel emotionally low, or feel unable to do activities that you used to do. Alcohol use screening. Using too much alcohol can affect your balance and may make you more likely to have a fall. What actions can I take to lower my risk of falls? General instructions Talk with your health care provider about your risks for falling. Tell your health care provider if: You fall. Be sure to tell your health care provider about all falls, even ones that seem minor. You feel dizzy, sleepy, or off-balance. Take over-the-counter and prescription medicines only as told by your health care provider. These include any supplements. Eat a healthy diet and maintain a healthy weight. A healthy diet includes low-fat dairy products, low-fat (lean) meats, and fiber from whole grains, beans, and lots of fruits and vegetables. Home safety Remove any tripping hazards, such as rugs, cords, and clutter. Install safety equipment such as grab bars in bathrooms and safety rails on stairs. Keep rooms and walkways well-lit. Activity  Follow a regular exercise program to stay fit. This will help you maintain your balance. Ask your health care provider what types of  exercise are appropriate for you. If you need a cane or walker, use it as recommended by your health care provider. Wear supportive shoes that have nonskid soles. Lifestyle Do not drink alcohol if your health care provider tells you not to drink. If you drink alcohol, limit how much you have: 0-1 drink a day for women. 0-2 drinks a day for  men. Be aware of how much alcohol is in your drink. In the [place] one drink equals one typical bottle of beer (12 oz), one-half glass of wine (5 oz), or one shot of hard liquor (1 oz). Do not use any products that contain nicotine or tobacco, such as cigarettes and e-cigarettes. If you need help quitting, ask your health care provider. Summary Having a healthy lifestyle and getting preventive care can help to protect your health and wellness after age 68. Screening and testing are the best way to find a health problem early and help you avoid having a fall. Early diagnosis and treatment give you the best chance for managing medical conditions that are more common for people who are older than age 31. Falls are a major cause of broken bones and head injuries in people who are older than age 68. Take precautions to prevent a fall at home. Work with your health care provider to learn what changes you can make to improve your health and wellness and to prevent falls. This information is not intended to replace advice given to you by your health care provider. Make sure you discuss any questions you have with your health care provider. Document Revised: 09/24/2020 Document Reviewed: 07/02/2020 Elsevier Patient Education  2022 [employer].

## 2021-04-22 NOTE — Assessment & Plan Note (Signed)

## 2021-04-22 NOTE — Assessment & Plan Note (Signed)
Update levels with PTH.

## 2021-04-25 LAB — PATHOLOGIST SMEAR REVIEW

## 2021-04-26 LAB — PARATHYROID HORMONE, INTACT (NO CA): PTH: 62 pg/mL (ref 16–77)

## 2021-04-26 LAB — VITAMIN D PNL(25-HYDRXY+1,25-DIHY)-BLD
Vit D, 1,25-Dihydroxy: 26.6 pg/mL (ref 24.8–81.5)
Vit D, 25-Hydroxy: 46.5 ng/mL (ref 30.0–100.0)

## 2021-04-26 LAB — VITAMIN B1: Vitamin B1 (Thiamine): 12 nmol/L (ref 8–30)

## 2021-04-28 ENCOUNTER — Encounter: Payer: Self-pay | Admitting: Family Medicine

## 2021-05-08 ENCOUNTER — Other Ambulatory Visit: Payer: Self-pay | Admitting: Family Medicine

## 2021-05-09 DIAGNOSIS — R296 Repeated falls: Secondary | ICD-10-CM | POA: Diagnosis not present

## 2021-05-09 DIAGNOSIS — M4800 Spinal stenosis, site unspecified: Secondary | ICD-10-CM | POA: Diagnosis not present

## 2021-05-09 DIAGNOSIS — R269 Unspecified abnormalities of gait and mobility: Secondary | ICD-10-CM | POA: Diagnosis not present

## 2021-05-17 DIAGNOSIS — R296 Repeated falls: Secondary | ICD-10-CM | POA: Diagnosis not present

## 2021-05-17 DIAGNOSIS — M4800 Spinal stenosis, site unspecified: Secondary | ICD-10-CM | POA: Diagnosis not present

## 2021-05-17 DIAGNOSIS — R269 Unspecified abnormalities of gait and mobility: Secondary | ICD-10-CM | POA: Diagnosis not present

## 2021-05-19 DIAGNOSIS — M4800 Spinal stenosis, site unspecified: Secondary | ICD-10-CM | POA: Diagnosis not present

## 2021-06-01 DIAGNOSIS — M4800 Spinal stenosis, site unspecified: Secondary | ICD-10-CM | POA: Diagnosis not present

## 2021-06-01 DIAGNOSIS — R296 Repeated falls: Secondary | ICD-10-CM | POA: Diagnosis not present

## 2021-06-01 DIAGNOSIS — R269 Unspecified abnormalities of gait and mobility: Secondary | ICD-10-CM | POA: Diagnosis not present

## 2021-06-13 DIAGNOSIS — M4800 Spinal stenosis, site unspecified: Secondary | ICD-10-CM | POA: Diagnosis not present

## 2021-06-28 DIAGNOSIS — R296 Repeated falls: Secondary | ICD-10-CM | POA: Diagnosis not present

## 2021-06-28 DIAGNOSIS — M4800 Spinal stenosis, site unspecified: Secondary | ICD-10-CM | POA: Diagnosis not present

## 2021-06-28 DIAGNOSIS — R269 Unspecified abnormalities of gait and mobility: Secondary | ICD-10-CM | POA: Diagnosis not present

## 2021-07-05 DIAGNOSIS — Z961 Presence of intraocular lens: Secondary | ICD-10-CM | POA: Diagnosis not present

## 2021-07-05 DIAGNOSIS — D492 Neoplasm of unspecified behavior of bone, soft tissue, and skin: Secondary | ICD-10-CM | POA: Diagnosis not present

## 2021-07-24 IMAGING — CT CT HEAD W/O CM
3 series · 15 of 47 positions shown, 18 images · non-contrast
Comparison: None.

CLINICAL DATA: Fell and hit the back of his head.

EXAM:
CT HEAD WITHOUT CONTRAST
CT CERVICAL SPINE WITHOUT CONTRAST
TECHNIQUE: Multidetector CT imaging of the head and cervical spine was
performed following the standard protocol without intravenous
contrast. Multiplanar CT image reconstructions of the cervical spine
were also generated.

[Series 3: head 5.0 h30s · axial · 0.48mm/px · z∈[-42,+103]mm · 9 of 35 slices shown, 12 images]
[im 3/35  brain]
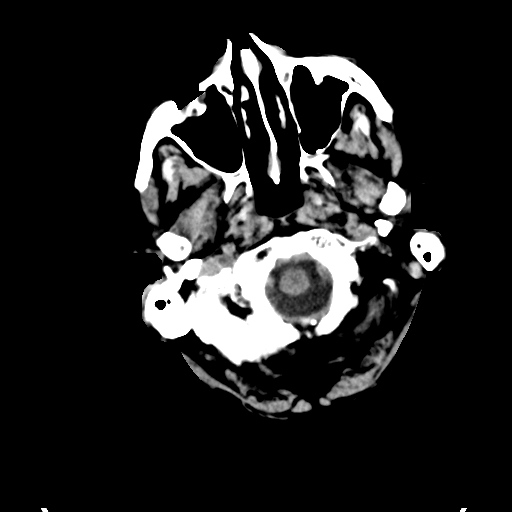
[im 3/35  bone]
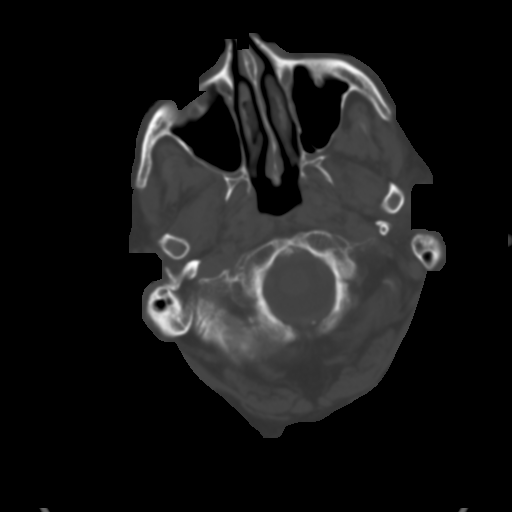
[im 6/35  brain]
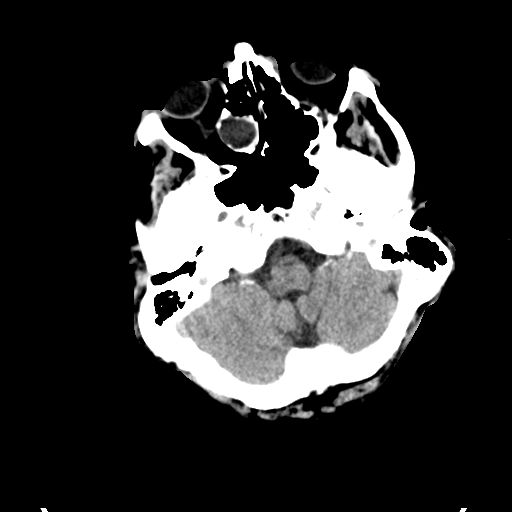
[im 10/35  brain]
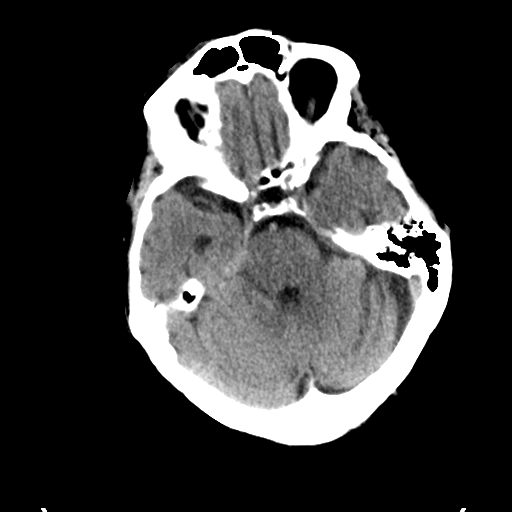
[im 13/35  brain]
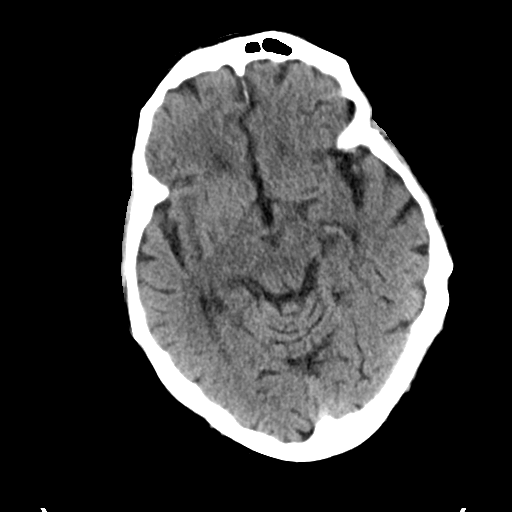
[im 18/35  brain]
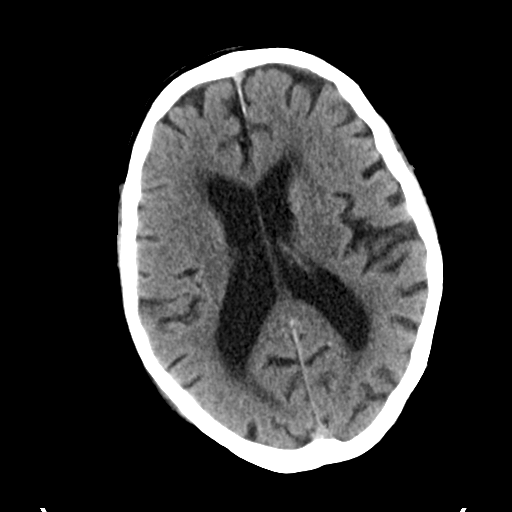
[im 18/35  bone]
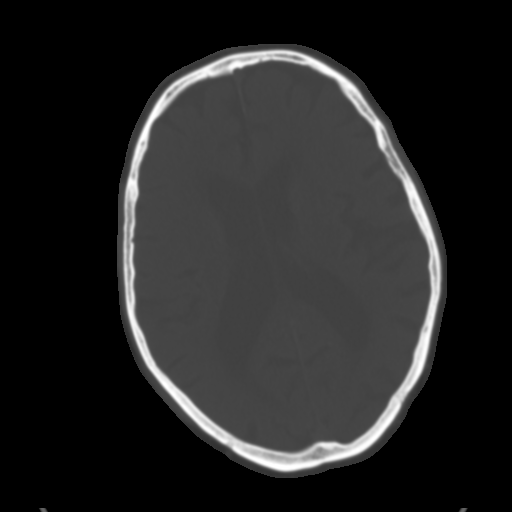
[im 22/35  brain]
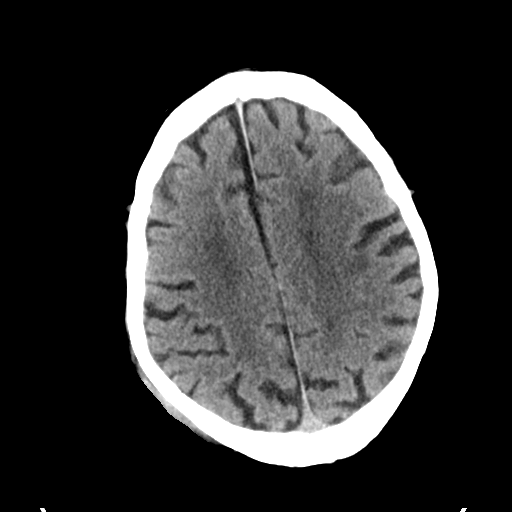
[im 25/35  brain]
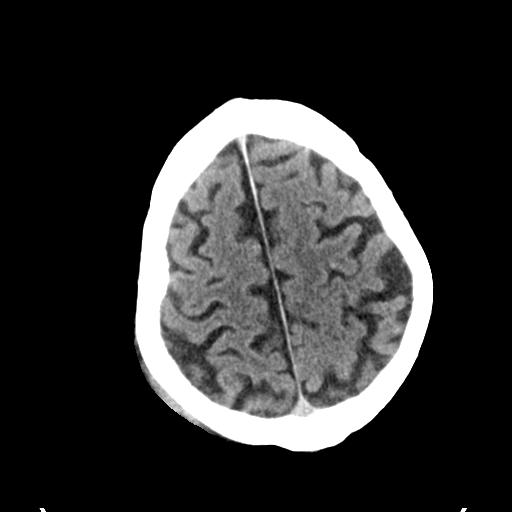
[im 29/35  brain]
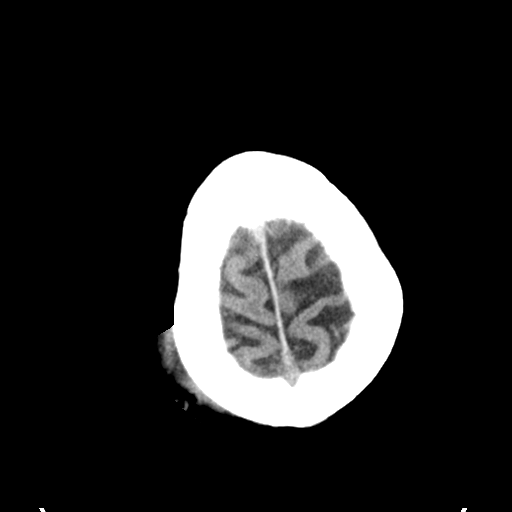
[im 32/35  brain]
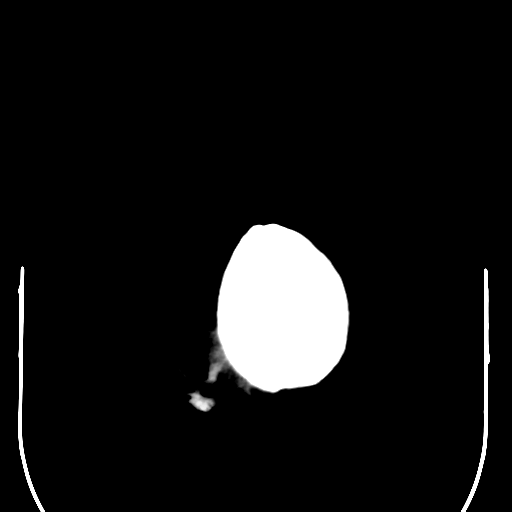
[im 32/35  bone]
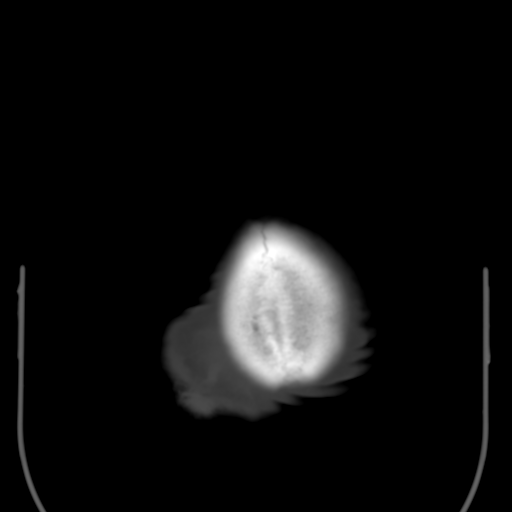

[Series 5: head 3.0 mpr cor · coronal · 0.31mm/px · 3 of 75 slices shown]
[im 27/75  brain]
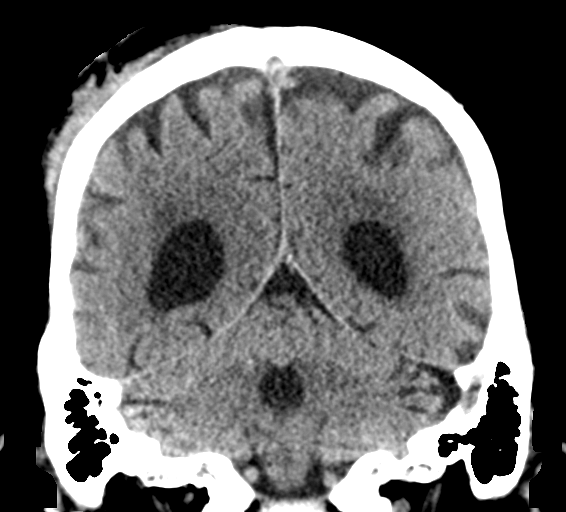
[im 34/75  brain]
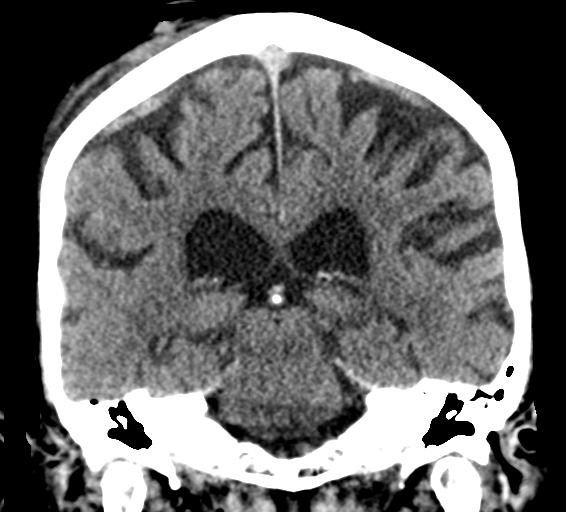
[im 41/75  brain]
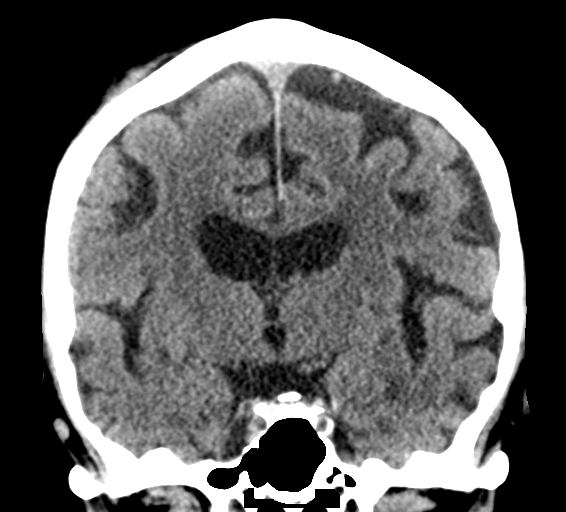

[Series 6: head 3.0 mpr sag · sagittal · 0.34mm/px · 3 of 67 slices shown]
[im 23/67  brain]
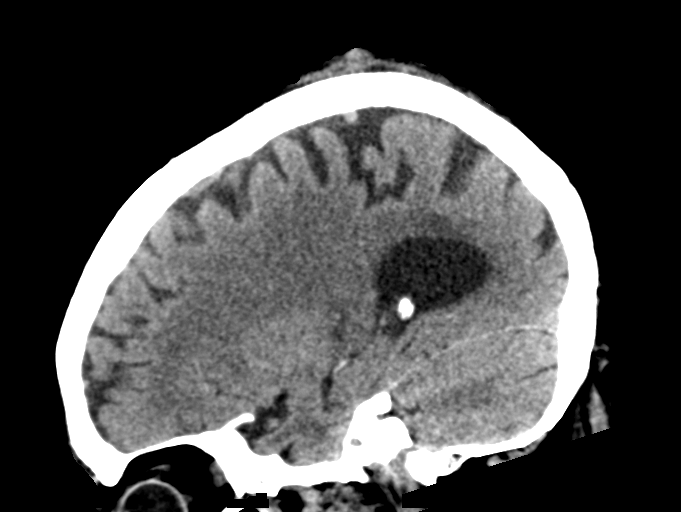
[im 34/67  brain]
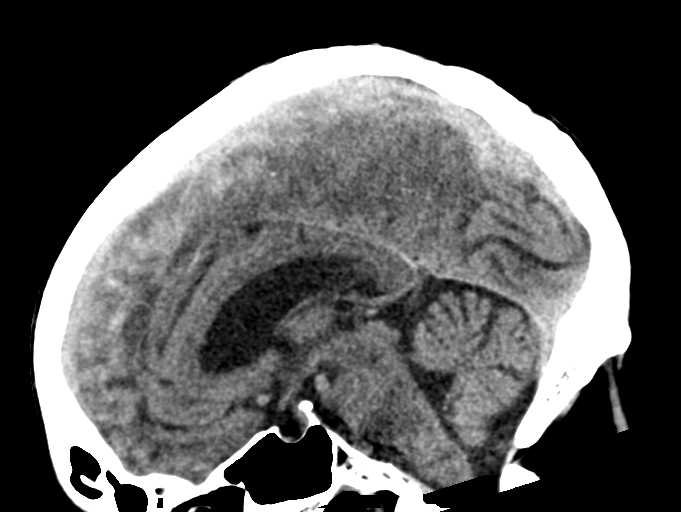
[im 45/67  brain]
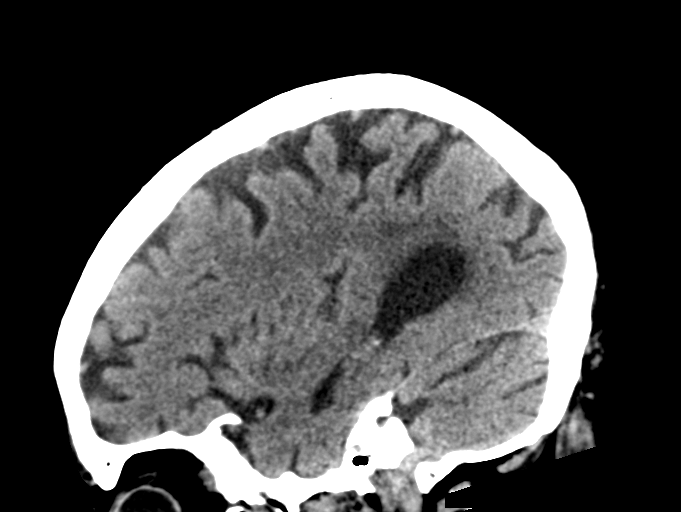

[15 of 47 positions shown; findings below may reference images not displayed]

FINDINGS: CT HEAD FINDINGS

Brain: No evidence of acute infarction, hemorrhage, hydrocephalus,
extra-axial collection or mass lesion/mass effect. Mild-to-moderate
generalized cerebral atrophy, within normal limits for age.
Scattered mild periventricular and subcortical white matter
hypodensities are nonspecific, but favored to reflect chronic
microvascular ischemic changes.

Vascular: Atherosclerotic vascular calcification of the carotid
siphons. No hyperdense vessel.

Skull: Normal. Negative for fracture or focal lesion.

Sinuses/Orbits: No acute finding. Slightly expansile retention cyst
in the posterior right ethmoid air cells with thinning of the
adjacent lamina papyracea.

Other: Large right parietal scalp hematoma.

CT CERVICAL SPINE FINDINGS

Alignment: Mild reversal of the normal cervical lordosis. No
traumatic malalignment.

Skull base and vertebrae: No acute fracture. No primary bone lesion
or focal pathologic process.

Soft tissues and spinal canal: No prevertebral fluid or swelling. No
visible canal hematoma.

Disc levels: Moderate disc height loss and uncovertebral hypertrophy
at C5-C6 and C6-C7.

Upper chest: Negative.

Other: None.
IMPRESSION: 1. No acute intracranial abnormality. Large right parietal scalp
hematoma.
2. No acute cervical spine fracture or traumatic malalignment.

## 2021-08-08 DIAGNOSIS — D492 Neoplasm of unspecified behavior of bone, soft tissue, and skin: Secondary | ICD-10-CM | POA: Diagnosis not present

## 2021-08-25 ENCOUNTER — Encounter: Payer: Self-pay | Admitting: Family Medicine

## 2021-10-24 DIAGNOSIS — M545 Low back pain, unspecified: Secondary | ICD-10-CM | POA: Diagnosis not present

## 2021-11-10 DIAGNOSIS — R269 Unspecified abnormalities of gait and mobility: Secondary | ICD-10-CM | POA: Diagnosis not present

## 2021-11-10 DIAGNOSIS — R2681 Unsteadiness on feet: Secondary | ICD-10-CM | POA: Diagnosis not present

## 2021-11-10 DIAGNOSIS — M5416 Radiculopathy, lumbar region: Secondary | ICD-10-CM | POA: Diagnosis not present

## 2021-11-10 DIAGNOSIS — M6281 Muscle weakness (generalized): Secondary | ICD-10-CM | POA: Diagnosis not present

## 2021-11-16 DIAGNOSIS — R269 Unspecified abnormalities of gait and mobility: Secondary | ICD-10-CM | POA: Diagnosis not present

## 2021-11-16 DIAGNOSIS — M6281 Muscle weakness (generalized): Secondary | ICD-10-CM | POA: Diagnosis not present

## 2021-11-16 DIAGNOSIS — M5416 Radiculopathy, lumbar region: Secondary | ICD-10-CM | POA: Diagnosis not present

## 2021-11-16 DIAGNOSIS — R2681 Unsteadiness on feet: Secondary | ICD-10-CM | POA: Diagnosis not present

## 2021-11-28 DIAGNOSIS — M5432 Sciatica, left side: Secondary | ICD-10-CM | POA: Diagnosis not present

## 2021-11-28 DIAGNOSIS — M5416 Radiculopathy, lumbar region: Secondary | ICD-10-CM | POA: Diagnosis not present

## 2022-01-06 DIAGNOSIS — Z961 Presence of intraocular lens: Secondary | ICD-10-CM | POA: Diagnosis not present

## 2022-01-06 DIAGNOSIS — D492 Neoplasm of unspecified behavior of bone, soft tissue, and skin: Secondary | ICD-10-CM | POA: Diagnosis not present

## 2022-02-14 ENCOUNTER — Encounter: Payer: Self-pay | Admitting: Family Medicine

## 2022-02-14 DIAGNOSIS — R040 Epistaxis: Secondary | ICD-10-CM

## 2022-02-14 NOTE — Telephone Encounter (Signed)
Lvm asking pt to call back.  Needs OV to have nosebleeds evaluated.

## 2022-02-21 NOTE — Addendum Note (Signed)
Addended by: Eustaquio Boyden on: 02/21/2022 07:56 AM   Modules accepted: Orders

## 2022-04-01 ENCOUNTER — Encounter: Payer: Self-pay | Admitting: Family Medicine

## 2022-04-19 ENCOUNTER — Other Ambulatory Visit: Payer: Medicare Other

## 2022-04-24 ENCOUNTER — Other Ambulatory Visit: Payer: Self-pay | Admitting: Family Medicine

## 2022-04-24 DIAGNOSIS — D7589 Other specified diseases of blood and blood-forming organs: Secondary | ICD-10-CM

## 2022-04-24 DIAGNOSIS — Z789 Other specified health status: Secondary | ICD-10-CM

## 2022-04-24 DIAGNOSIS — I10 Essential (primary) hypertension: Secondary | ICD-10-CM

## 2022-04-26 ENCOUNTER — Encounter: Payer: Medicare Other | Admitting: Family Medicine

## 2022-04-26 ENCOUNTER — Other Ambulatory Visit (INDEPENDENT_AMBULATORY_CARE_PROVIDER_SITE_OTHER): Payer: Medicare Other

## 2022-04-26 DIAGNOSIS — Z789 Other specified health status: Secondary | ICD-10-CM

## 2022-04-26 DIAGNOSIS — D7589 Other specified diseases of blood and blood-forming organs: Secondary | ICD-10-CM

## 2022-04-26 DIAGNOSIS — I10 Essential (primary) hypertension: Secondary | ICD-10-CM | POA: Diagnosis not present

## 2022-04-26 LAB — CBC WITH DIFFERENTIAL/PLATELET
Basophils Absolute: 0 10*3/uL (ref 0.0–0.1)
Basophils Relative: 0.7 % (ref 0.0–3.0)
Eosinophils Absolute: 0.1 10*3/uL (ref 0.0–0.7)
Eosinophils Relative: 1.5 % (ref 0.0–5.0)
HCT: 33.6 % — ABNORMAL LOW (ref 39.0–52.0)
Hemoglobin: 11.5 g/dL — ABNORMAL LOW (ref 13.0–17.0)
Lymphocytes Relative: 28.3 % (ref 12.0–46.0)
Lymphs Abs: 1.5 10*3/uL (ref 0.7–4.0)
MCHC: 34.3 g/dL (ref 30.0–36.0)
MCV: 104.8 fl — ABNORMAL HIGH (ref 78.0–100.0)
Monocytes Absolute: 0.5 10*3/uL (ref 0.1–1.0)
Monocytes Relative: 9.8 % (ref 3.0–12.0)
Neutro Abs: 3.1 10*3/uL (ref 1.4–7.7)
Neutrophils Relative %: 59.7 % (ref 43.0–77.0)
Platelets: 190 10*3/uL (ref 150.0–400.0)
RBC: 3.2 Mil/uL — ABNORMAL LOW (ref 4.22–5.81)
RDW: 13.7 % (ref 11.5–15.5)
WBC: 5.1 10*3/uL (ref 4.0–10.5)

## 2022-04-26 LAB — COMPREHENSIVE METABOLIC PANEL
ALT: 12 U/L (ref 0–53)
AST: 18 U/L (ref 0–37)
Albumin: 3.9 g/dL (ref 3.5–5.2)
Alkaline Phosphatase: 50 U/L (ref 39–117)
BUN: 14 mg/dL (ref 6–23)
CO2: 30 mEq/L (ref 19–32)
Calcium: 9.8 mg/dL (ref 8.4–10.5)
Chloride: 102 mEq/L (ref 96–112)
Creatinine, Ser: 1.07 mg/dL (ref 0.40–1.50)
GFR: 62.63 mL/min (ref >60.00)
Glucose, Bld: 106 mg/dL — ABNORMAL HIGH (ref 70–99)
Potassium: 4.3 mEq/L (ref 3.5–5.1)
Sodium: 138 mEq/L (ref 135–145)
Total Bilirubin: 0.5 mg/dL (ref 0.2–1.2)
Total Protein: 6.1 g/dL (ref 6.0–8.3)

## 2022-04-26 LAB — LIPID PANEL
Cholesterol: 147 mg/dL (ref 0–200)
HDL: 65.1 mg/dL (ref >39.00)
LDL Cholesterol: 72 mg/dL (ref 0–99)
NonHDL: 82.24
Total CHOL/HDL Ratio: 2
Triglycerides: 53 mg/dL (ref 0.0–149.0)
VLDL: 10.6 mg/dL (ref 0.0–40.0)

## 2022-04-26 LAB — TSH: TSH: 2.37 u[IU]/mL (ref 0.35–5.50)

## 2022-04-26 LAB — IBC PANEL
Iron: 127 ug/dL (ref 42–165)
Saturation Ratios: 48.5 % (ref 20.0–50.0)
TIBC: 261.8 ug/dL (ref 250.0–450.0)
Transferrin: 187 mg/dL — ABNORMAL LOW (ref 212.0–360.0)

## 2022-04-26 LAB — FOLATE: Folate: >23.9 ng/mL (ref >5.9)

## 2022-04-26 LAB — MICROALBUMIN / CREATININE URINE RATIO
Creatinine,U: 46.9 mg/dL
Microalb Creat Ratio: 1.5 mg/g (ref 0.0–30.0)
Microalb, Ur: <0.7 mg/dL (ref 0.0–1.9)

## 2022-04-26 LAB — VITAMIN B12: Vitamin B-12: 535 pg/mL (ref 211–911)

## 2022-04-26 LAB — FERRITIN: Ferritin: 200.6 ng/mL (ref 22.0–322.0)

## 2022-04-30 LAB — VITAMIN B1: Vitamin B1 (Thiamine): 26 nmol/L (ref 8–30)

## 2022-05-03 ENCOUNTER — Encounter: Payer: Self-pay | Admitting: Family Medicine

## 2022-05-03 ENCOUNTER — Ambulatory Visit (INDEPENDENT_AMBULATORY_CARE_PROVIDER_SITE_OTHER): Payer: Medicare Other | Admitting: Family Medicine

## 2022-05-03 VITALS — BP 136/78 | HR 65 | Temp 97.2°F | Ht 64.75 in | Wt 162.0 lb

## 2022-05-03 DIAGNOSIS — D539 Nutritional anemia, unspecified: Secondary | ICD-10-CM

## 2022-05-03 DIAGNOSIS — Z0000 Encounter for general adult medical examination without abnormal findings: Secondary | ICD-10-CM

## 2022-05-03 DIAGNOSIS — L989 Disorder of the skin and subcutaneous tissue, unspecified: Secondary | ICD-10-CM

## 2022-05-03 DIAGNOSIS — R140 Abdominal distension (gaseous): Secondary | ICD-10-CM

## 2022-05-03 DIAGNOSIS — J449 Chronic obstructive pulmonary disease, unspecified: Secondary | ICD-10-CM

## 2022-05-03 DIAGNOSIS — R413 Other amnesia: Secondary | ICD-10-CM

## 2022-05-03 DIAGNOSIS — Z87898 Personal history of other specified conditions: Secondary | ICD-10-CM

## 2022-05-03 DIAGNOSIS — I10 Essential (primary) hypertension: Secondary | ICD-10-CM

## 2022-05-03 DIAGNOSIS — M79605 Pain in left leg: Secondary | ICD-10-CM

## 2022-05-03 MED ORDER — TRIAMTERENE-HCTZ 37.5-25 MG PO TABS: 1.0000 | ORAL_TABLET | ORAL | 3 refills | Status: DC

## 2022-05-03 MED ORDER — METOPROLOL TARTRATE 25 MG PO TABS: 12.5000 mg | ORAL_TABLET | Freq: Two times a day (BID) | ORAL | 3 refills | Status: DC

## 2022-05-03 NOTE — Patient Instructions (Addendum)
Happy early Birthday  Pass by lab for blood test. If anemia persists, we may refer you to blood doctor hematologist.  For gassiness - try to limit gas producing foods (beans, onions, celery, carrots, raisins, bananas, apricots, prunes, brussel sprouts, wheat germ, pretzels). May also try Gas-X.  Increase water,increase fiber in the diet, start taking colace OTC stool softener 100mg  once daily  Return in 1 year for next physical.   Health Maintenance After Age 52 After age 37, you are at a higher risk for certain long-term diseases and infections as well as injuries from falls. Falls are a major cause of broken bones and head injuries in people who are older than age 62. Getting regular preventive care can help to keep you healthy and well. Preventive care includes getting regular testing and making lifestyle changes as recommended by your health care provider. Talk with your health care provider about: Which screenings and tests you should have. A screening is a test that checks for a disease when you have no symptoms. A diet and exercise plan that is right for you. What should I know about screenings and tests to prevent falls? Screening and testing are the best ways to find a health problem early. Early diagnosis and treatment give you the best chance of managing medical conditions that are common after age 71. Certain conditions and lifestyle choices may make you more likely to have a fall. Your health care provider may recommend: Regular vision checks. Poor vision and conditions such as cataracts can make you more likely to have a fall. If you wear glasses, make sure to get your prescription updated if your vision changes. Medicine review. Work with your health care provider to regularly review all of the medicines you are taking, including over-the-counter medicines. Ask your health care provider about any side effects that may make you more likely to have a fall. Tell your health care provider if  any medicines that you take make you feel dizzy or sleepy. Strength and balance checks. Your health care provider may recommend certain tests to check your strength and balance while standing, walking, or changing positions. Foot health exam. Foot pain and numbness, as well as not wearing proper footwear, can make you more likely to have a fall. Screenings, including: Osteoporosis screening. Osteoporosis is a condition that causes the bones to get weaker and break more easily. Blood pressure screening. Blood pressure changes and medicines to control blood pressure can make you feel dizzy. Depression screening. You may be more likely to have a fall if you have a fear of falling, feel depressed, or feel unable to do activities that you used to do. Alcohol use screening. Using too much alcohol can affect your balance and may make you more likely to have a fall. Follow these instructions at home: Lifestyle Do not drink alcohol if: Your health care provider tells you not to drink. If you drink alcohol: Limit how much you have to: 0-1 drink a day for women. 0-2 drinks a day for men. Know how much alcohol is in your drink. In the [place] one drink equals one 12 oz bottle of beer (355 mL), one 5 oz glass of wine (148 mL), or one 1 oz glass of hard liquor (44 mL). Do not use any products that contain nicotine or tobacco. These products include cigarettes, chewing tobacco, and vaping devices, such as e-cigarettes. If you need help quitting, ask your health care provider. Activity  Follow a regular exercise program to stay  fit. This will help you maintain your balance. Ask your health care provider what types of exercise are appropriate for you. If you need a cane or walker, use it as recommended by your health care provider. Wear supportive shoes that have nonskid soles. Safety  Remove any tripping hazards, such as rugs, cords, and clutter. Install safety equipment such as grab bars in bathrooms and  safety rails on stairs. Keep rooms and walkways well-lit. General instructions Talk with your health care provider about your risks for falling. Tell your health care provider if: You fall. Be sure to tell your health care provider about all falls, even ones that seem minor. You feel dizzy, tiredness (fatigue), or off-balance. Take over-the-counter and prescription medicines only as told by your health care provider. These include supplements. Eat a healthy diet and maintain a healthy weight. A healthy diet includes low-fat dairy products, low-fat (lean) meats, and fiber from whole grains, beans, and lots of fruits and vegetables. Stay current with your vaccines. Schedule regular health, dental, and eye exams. Summary Having a healthy lifestyle and getting preventive care can help to protect your health and wellness after age 80. Screening and testing are the best way to find a health problem early and help you avoid having a fall. Early diagnosis and treatment give you the best chance for managing medical conditions that are more common for people who are older than age 98. Falls are a major cause of broken bones and head injuries in people who are older than age 40. Take precautions to prevent a fall at home. Work with your health care provider to learn what changes you can make to improve your health and wellness and to prevent falls. This information is not intended to replace advice given to you by your health care provider. Make sure you discuss any questions you have with your health care provider. Document Revised: 12/06/2020 Document Reviewed: 12/06/2020 Elsevier Patient Education  2023 [employer].

## 2022-05-03 NOTE — Progress Notes (Unsigned)
Patient ID: RADAMES MEJORADO, male    DOB: [birth year], 88 y.o.   MRN: [id number]  This visit was conducted in person.  BP 136/78   Pulse 65   Temp (!) 97.2 F (36.2 C) (Temporal)   Ht 5' 4.75" (1.645 m)   Wt 162 lb (73.5 kg)   SpO2 100%   BMI 27.17 kg/m    CC: AMW Subjective:   HPI: JAIDYN KUHL is a 88 y.o. male presenting on 05/03/2022 for Medicare Wellness   Did not see health advisor.   Hearing Screening - Comments:: Wears bilateral hearing aids.  Wearing at today's OV.  Vision Screening - Comments:: Last eye exam, 12/2021.   [facility] Office Visit from 04/22/2021 in [place] [facility] [place]  PHQ-2 Total Score 1          05/03/2022   11:11 AM 04/22/2021    8:44 AM 03/19/2019   12:49 PM 03/12/2018    9:09 AM 02/21/2017    8:13 AM  Fall Risk   Falls in the past year? 0 1 0 No No  Number falls in past yr:  1     Injury with Fall?  1     Comment  Head injury      Wife has Parkinson's disease, now followed by Dr Tat.   10 lb weight loss - he's fully quit drinking 16 months ago.   L leg pain he attributes to sciatica. He's seen ortho Dr Mardelle Matte in the past.  Notes significant gassiness. No benefit with mylanta.  Scab to vertex of scalp that is poorly healing, present for about a month.  Had nose bleeds several months ago, none recently.   Progressive macrocytic anemia - denies blood in stool or urine, fevers/chills, night sweats, swollen glands, unexpected weight loss (although he is down 10 lbs in the past year).   Preventative: COLONOSCOPY Date: 07/2013 no polyps [facility])  Prostate - elevated PSA remotely with normal biopsy per patient - this was followed by Dr. Junious Silk in past Q6 mo active surveillance. Has not returned since 01/2013. Does not want to return to uro. Latest labs PSA had normalized, aged out since.  Lung cancer screening - not eligible Flu shot - yearly  Tetanus 2010, Tdap 2012 Pneumovax 07/2002. Prevnar-13: 01/2014 zostavax  2008 [place] 08/2019, 09/2019 - booster 09/2020, bivalent 03/2021, again 11/2021 and monovalent 03/2022 Zostavax 2008 Shingrix - discussed, declines  Advanced directives: has this at home. Wife Christin Fudge would be HCPOA. States [facility] has this - I advised I don't see it in his chart. Declines bringing [place] a copy.  Seat belt use discussed  Sunscreen use discussed. Wears a hat when outdoors.  Ex-smoker - quit 2000 (50 PY hx) Alcohol - previously drank 16 oz chardonnay daily. Fully quit 11/2020 - attributes weight loss to stopping alcohol.  Dentist - Q4 mo  Eye exam - yearly  Bowel - + constipation - see above Bladder - no incontinence    Lives with Vaughan Basta wife (married since 2005)  Occupation: Armed forces logistics/support/administrative officer, retired  Edu: 18+ yrs  Activity: uses stationary bike daily (3-4 mi) Diet: good water, fruits/vegetables daily. Follows mediterranean diet. Drinks unsweet tea, avoid soft drinks.     Relevant past medical, surgical, family and social history reviewed and updated as indicated. Interim medical history since our last visit reviewed. Allergies and medications reviewed and updated. Outpatient Medications Prior to Visit  Medication Sig Dispense Refill   Coenzyme Q10 (COQ-10)  50 MG CAPS Take by mouth daily.     Ginkgo Biloba 40 MG TABS Take 2 tablets (80 mg total) by mouth daily.     Multiple Vitamin (MULTIVITAMIN) tablet Take 1 tablet by mouth daily.     multivitamin-lutein (OCUVITE-LUTEIN) CAPS capsule Take 1 capsule by mouth daily.     Naproxen Sod-diphenhydrAMINE (ALEVE PM) 220-25 MG TABS Take 1 tablet by mouth at bedtime as needed (insomnia).     POTASSIUM PO Take 500 mg by mouth. Takes 3 times weekly     Zn-Pyg Afri-Nettle-Saw Palmet (SAW PALMETTO COMPLEX PO) Take 250 mg by mouth every other day.      metoprolol tartrate (LOPRESSOR) 25 MG tablet TAKE 1/2 TABLET TWICE DAILY 90 tablet 3   triamterene-hydrochlorothiazide (MAXZIDE-25) 37.5-25 MG tablet Take 1 tablet by mouth every  other day. 45 tablet 3   Milk Thistle Extract 175 MG TABS Take 1.4286 tablets (250 mg total) by mouth 3 (three) times a week.     vitamin k 100 MCG tablet Take 1 tablet (100 mcg total) by mouth daily.     No facility-administered medications prior to visit.     Per HPI unless specifically indicated in ROS section below Review of Systems  Constitutional:  Negative for activity change, appetite change, chills, fatigue, fever and unexpected weight change.  HENT:  Negative for hearing loss.   Eyes:  Negative for visual disturbance.  Respiratory:  Negative for cough, chest tightness, shortness of breath and wheezing.   Cardiovascular:  Negative for chest pain, palpitations and leg swelling.  Gastrointestinal:  Positive for constipation. Negative for abdominal distention, abdominal pain, blood in stool, diarrhea, nausea and vomiting.  Genitourinary:  Negative for difficulty urinating and hematuria.  Musculoskeletal:  Negative for arthralgias, myalgias and neck pain.  Skin:  Negative for rash.  Neurological:  Negative for dizziness, seizures, syncope and headaches.  Hematological:  Negative for adenopathy. Bruises/bleeds easily.  Psychiatric/Behavioral:  Negative for dysphoric mood. The patient is not nervous/anxious.     Objective:  BP 136/78   Pulse 65   Temp (!) 97.2 F (36.2 C) (Temporal)   Ht 5' 4.75" (1.645 m)   Wt 162 lb (73.5 kg)   SpO2 100%   BMI 27.17 kg/m   Wt Readings from Last 3 Encounters:  05/03/22 162 lb (73.5 kg)  04/22/21 172 lb 3 oz (78.1 kg)  01/07/21 178 lb (80.7 kg)      Physical Exam Vitals and nursing note reviewed.  Constitutional:      General: He is not in acute distress.    Appearance: Normal appearance. He is well-developed. He is not ill-appearing.     Comments: Ambulates with cane  HENT:     Head: Normocephalic and atraumatic.     Right Ear: Hearing, tympanic membrane, ear canal and external ear normal.     Left Ear: Hearing, tympanic membrane,  ear canal and external ear normal.  Eyes:     General: No scleral icterus.    Extraocular Movements: Extraocular movements intact.     Conjunctiva/sclera: Conjunctivae normal.     Pupils: Pupils are equal, round, and reactive to light.  Neck:     Thyroid: No thyroid mass or thyromegaly.  Cardiovascular:     Rate and Rhythm: Normal rate and regular rhythm.     Pulses: Normal pulses.          Radial pulses are 2+ on the right side and 2+ on the left side.     Heart  sounds: Normal heart sounds. No murmur heard. Pulmonary:     Effort: Pulmonary effort is normal. No respiratory distress.     Breath sounds: Normal breath sounds. No wheezing, rhonchi or rales.  Abdominal:     General: Bowel sounds are normal. There is no distension.     Palpations: Abdomen is soft. There is no mass.     Tenderness: There is no abdominal tenderness. There is no guarding or rebound.     Hernia: No hernia is present.  Musculoskeletal:        General: Normal range of motion.     Cervical back: Normal range of motion and neck supple.     Right lower leg: No edema.     Left lower leg: No edema.     Comments:  No pain to palpation of left leg. Neg SLR bilaterally FROM at left hip  Lymphadenopathy:     Cervical: No cervical adenopathy.  Skin:    General: Skin is warm and dry.     Findings: No rash.  Neurological:     General: No focal deficit present.     Mental Status: He is alert and oriented to person, place, and time.     Comments:  Recall 3/3 Calculation 4/5 DROW  Psychiatric:        Mood and Affect: Mood normal.        Behavior: Behavior normal.        Thought Content: Thought content normal.        Judgment: Judgment normal.       Results for orders placed or performed in visit on 05/03/22  Lactate dehydrogenase  Result Value Ref Range   LDH 197 120 - 250 U/L  CBC with Differential/Platelet  Result Value Ref Range   WBC 6.4 3.8 - 10.8 Thousand/uL   RBC 3.26 (L) 4.20 - 5.80 Million/uL    Hemoglobin 11.6 (L) 13.2 - 17.1 g/dL   HCT 33.3 (L) 38.5 - 50.0 %   MCV 102.1 (H) 80.0 - 100.0 fL   MCH 35.6 (H) 27.0 - 33.0 pg   MCHC 34.8 32.0 - 36.0 g/dL   RDW 11.7 11.0 - 15.0 %   Platelets 213 140 - 400 Thousand/uL   MPV 10.8 7.5 - 12.5 fL   Neutro Abs 3,782 1,500 - 7,800 cells/uL   Lymphs Abs 1,779 850 - 3,900 cells/uL   Absolute Monocytes 717 200 - 950 cells/uL   Eosinophils Absolute 90 15 - 500 cells/uL   Basophils Absolute 32 0 - 200 cells/uL   Neutrophils Relative % 59.1 %   Total Lymphocyte 27.8 %   Monocytes Relative 11.2 %   Eosinophils Relative 1.4 %   Basophils Relative 0.5 %  SPECIMEN COMPROMISED  Result Value Ref Range   Specimen Integrity      Assessment & Plan:   Problem List Items Addressed This Visit     Medicare annual wellness visit, subsequent - Primary (Chronic)    I have personally reviewed the Medicare Annual Wellness questionnaire and have noted 1. The patient's medical and social history 2. Their use of alcohol, tobacco or illicit drugs 3. Their current medications and supplements 4. The patient's functional ability including ADL's, fall risks, home safety risks and hearing or visual impairment. Cognitive function has been assessed and addressed as indicated.  5. Diet and physical activity 6. Evidence for depression or mood disorders The patients weight, height, BMI have been recorded in the chart. I have made referrals, counseling and provided  education to the patient based on review of the above and I have provided the pt with a written personalized care plan for preventive services. Provider list updated.. See scanned questionairre as needed for further documentation. Reviewed preventative protocols and updated unless pt declined.       Health maintenance examination (Chronic)    Preventative protocols reviewed and updated unless pt declined. Discussed healthy diet and lifestyle.       HTN (hypertension)    Chronic, stable on maxzide  and metoprolol, low dose of both      Relevant Medications   triamterene-hydrochlorothiazide (MAXZIDE-25) 37.5-25 MG tablet   metoprolol tartrate (LOPRESSOR) 25 MG tablet   History of alcohol use    Congratulated, encouraged continued abstinence.       COPD (chronic obstructive pulmonary disease) (HCC)    H/o this in long smoking history. Stable off respiratory medication. Latest CT didn't show significant COPD changes - consider resolving problem.      Macrocytic anemia    Improvement in macrocytosis since quitting alcohol, however progressive anemia noted. Iron levels not consistent with iron deficiency anemia. Other vitamin levels normal range.  With weight loss noted, ?myelodyslpasia related - check LDH, periph smear. Discussed possible hematology evaluation esp if progressive.       Relevant Orders   Pathologist smear review   Lactate dehydrogenase (Completed)   CBC with Differential/Platelet (Completed)   Memory deficit    Stable period off medication. Anticipate improvement since quitting alcohol.       Skin lesions    Appears to have scab to scalp vertex - rec moisturizing cream. Update if persistent for derm eval.       Left leg pain    Anticipate related to sciatica - SLR negative today, preserved ROM at hip.       Gassiness    rec limiting gas producing foods, trial Gas-X.         Meds ordered this encounter  Medications   triamterene-hydrochlorothiazide (MAXZIDE-25) 37.5-25 MG tablet    Sig: Take 1 tablet by mouth every other day.    Dispense:  45 tablet    Refill:  3   metoprolol tartrate (LOPRESSOR) 25 MG tablet    Sig: Take 0.5 tablets (12.5 mg total) by mouth 2 (two) times daily.    Dispense:  90 tablet    Refill:  3   Orders Placed This Encounter  Procedures   Pathologist smear review   Lactate dehydrogenase   CBC with Differential/Platelet   SPECIMEN COMPROMISED    Patient instructions: Happy early Birthday  Pass by lab for blood test.  If anemia persists, we may refer you to blood doctor hematologist.  For gassiness - try to limit gas producing foods (beans, onions, celery, carrots, raisins, bananas, apricots, prunes, brussel sprouts, wheat germ, pretzels). May also try Gas-X.  Increase water,increase fiber in the diet, start taking colace OTC stool softener.  Return in 1 year for next physical.   Follow up plan: Return in about 6 months (around 11/02/2022) for follow up visit.  Ria Bush, MD

## 2022-05-03 NOTE — Assessment & Plan Note (Signed)
Preventative protocols reviewed and updated unless pt declined. Discussed healthy diet and lifestyle.  

## 2022-05-03 NOTE — Assessment & Plan Note (Signed)

## 2022-05-04 DIAGNOSIS — M5432 Sciatica, left side: Secondary | ICD-10-CM | POA: Insufficient documentation

## 2022-05-04 DIAGNOSIS — R140 Abdominal distension (gaseous): Secondary | ICD-10-CM | POA: Insufficient documentation

## 2022-05-04 DIAGNOSIS — M79605 Pain in left leg: Secondary | ICD-10-CM | POA: Insufficient documentation

## 2022-05-04 DIAGNOSIS — M5430 Sciatica, unspecified side: Secondary | ICD-10-CM | POA: Insufficient documentation

## 2022-05-04 LAB — CBC WITH DIFFERENTIAL/PLATELET
Absolute Monocytes: 717 cells/uL (ref 200–950)
Basophils Absolute: 32 cells/uL (ref 0–200)
Basophils Relative: 0.5 %
Eosinophils Absolute: 90 cells/uL (ref 15–500)
Eosinophils Relative: 1.4 %
HCT: 33.3 % — ABNORMAL LOW (ref 38.5–50.0)
Hemoglobin: 11.6 g/dL — ABNORMAL LOW (ref 13.2–17.1)
Lymphs Abs: 1779 cells/uL (ref 850–3900)
MCH: 35.6 pg — ABNORMAL HIGH (ref 27.0–33.0)
MCHC: 34.8 g/dL (ref 32.0–36.0)
MCV: 102.1 fL — ABNORMAL HIGH (ref 80.0–100.0)
MPV: 10.8 fL (ref 7.5–12.5)
Monocytes Relative: 11.2 %
Neutro Abs: 3782 cells/uL (ref 1500–7800)
Neutrophils Relative %: 59.1 %
Platelets: 213 10*3/uL (ref 140–400)
RBC: 3.26 10*6/uL — ABNORMAL LOW (ref 4.20–5.80)
RDW: 11.7 % (ref 11.0–15.0)
Total Lymphocyte: 27.8 %
WBC: 6.4 10*3/uL (ref 3.8–10.8)

## 2022-05-04 LAB — LACTATE DEHYDROGENASE: LDH: 197 U/L (ref 120–250)

## 2022-05-04 LAB — PATHOLOGIST SMEAR REVIEW

## 2022-05-04 LAB — SPECIMEN COMPROMISED

## 2022-05-04 NOTE — Assessment & Plan Note (Signed)
Appears to have scab to scalp vertex - rec moisturizing cream. Update if persistent for derm eval.

## 2022-05-04 NOTE — Assessment & Plan Note (Signed)
rec limiting gas producing foods, trial Gas-X.

## 2022-05-04 NOTE — Assessment & Plan Note (Addendum)
Improvement in macrocytosis since quitting alcohol, however progressive anemia noted. Iron levels not consistent with iron deficiency anemia. Other vitamin levels normal range.  With weight loss noted, ?myelodyslpasia related - check LDH, periph smear. Discussed possible hematology evaluation esp if progressive.

## 2022-05-04 NOTE — Assessment & Plan Note (Addendum)
Congratulated ,encouraged continued abstinence. 

## 2022-05-04 NOTE — Assessment & Plan Note (Signed)
Stable period off medication. Anticipate improvement since quitting alcohol.

## 2022-05-04 NOTE — Assessment & Plan Note (Signed)
Chronic, stable on maxzide and metoprolol, low dose of both

## 2022-05-04 NOTE — Assessment & Plan Note (Signed)
H/o this in long smoking history. Stable off respiratory medication. Latest CT didn't show significant COPD changes - consider resolving problem.

## 2022-05-04 NOTE — Assessment & Plan Note (Signed)
Anticipate related to sciatica - SLR negative today, preserved ROM at hip.

## 2022-05-19 ENCOUNTER — Other Ambulatory Visit: Payer: Self-pay | Admitting: Family Medicine

## 2023-04-21 ENCOUNTER — Other Ambulatory Visit: Payer: Self-pay | Admitting: Family Medicine

## 2023-04-25 NOTE — Telephone Encounter (Signed)
Please call and schedule Medicare wellness with Ephriam Knuckles and CPE with fasting labs prior with Dr. Sharen Hones.

## 2023-04-25 NOTE — Telephone Encounter (Signed)
Called patient to schedule,and he was very rude,and said that he really do not want to come In to see Dr,G.I tried to persuade him to schedule and he said that he's gonna "find somebody else" then hung up.

## 2023-04-25 NOTE — Telephone Encounter (Signed)
Noted.  Dr. Reece Agar removed as PCP.  FYI to Dr. Reece Agar.

## 2023-04-26 MED ORDER — TRIAMTERENE-HCTZ 37.5-25 MG PO TABS: 1.0000 | ORAL_TABLET | ORAL | 0 refills | Status: DC

## 2023-04-26 NOTE — Addendum Note (Signed)
Addended by: Eustaquio Boyden on: 04/26/2023 11:16 PM   Modules accepted: Orders

## 2023-04-26 NOTE — Telephone Encounter (Signed)
Noted. I will refill meds for one 45mo supply while he finds new PCP.  He lives in [place] and anticipate he doesn't want to drive out to our office anymore.

## 2023-05-28 ENCOUNTER — Ambulatory Visit (INDEPENDENT_AMBULATORY_CARE_PROVIDER_SITE_OTHER): Payer: Medicare Other

## 2023-05-28 VITALS — Ht 66.0 in | Wt 154.0 lb

## 2023-05-28 DIAGNOSIS — Z0000 Encounter for general adult medical examination without abnormal findings: Secondary | ICD-10-CM

## 2023-05-28 NOTE — Patient Instructions (Signed)
[name] , Thank you for taking time to come for your Medicare Wellness Visit. I appreciate your ongoing commitment to your health goals. Please review the following plan we discussed and let me know if I can assist you in the future.   Referrals/Orders/Follow-Ups/Clinician Recommendations: Aim for 30 minutes of exercise or brisk walking, 6-8 glasses of water, and 5 servings of fruits and vegetables each day.   This is a list of the screening recommended for you and due dates:  Health Maintenance  Topic Date Due   Zoster (Shingles) Vaccine (1 of 2) 05/07/1985   COVID-19 Vaccine (7 - 2023-24 season) 04/01/2023   Medicare Annual Wellness Visit  05/27/2024   DTaP/Tdap/Td vaccine (4 - Td or Tdap) 01/08/2031   Pneumonia Vaccine  Completed   Flu Shot  Completed   HPV Vaccine  Aged Out    Advanced directives: (Copy Requested) Please bring a copy of your health care power of attorney and living will to the office to be added to your chart at your convenience.  Next Medicare Annual Wellness Visit scheduled for next year: Yes  insert Preventive Care attachment Insert FALL PREVENTION attachment if needed

## 2023-05-28 NOTE — Progress Notes (Signed)
Subjective:   [name] is a 89 y.o. male who presents for Medicare Annual/Subsequent preventive examination.  Visit Complete: Virtual I connected with  [name] on 05/28/23 by a audio enabled telemedicine application and verified that I am speaking with the correct person using two identifiers.  Patient Location: Home  Provider Location: Home Office  I discussed the limitations of evaluation and management by telemedicine. The patient expressed understanding and agreed to proceed.  Vital Signs: Because this visit was a virtual/telehealth visit, some criteria may be missing or patient reported. Any vitals not documented were not able to be obtained and vitals that have been documented are patient reported.  Patient Medicare AWV questionnaire was completed by the patient on 05/28/2023; I have confirmed that all information answered by patient is correct and no changes since this date.  Cardiac Risk Factors include: advanced age (>58men, >25 women);male gender     Objective:    Today's Vitals   05/28/23 1302  Weight: 154 lb (69.9 kg)  Height: 5\' 6"  (1.676 m)   Body mass index is 24.86 kg/m.     05/28/2023    1:05 PM 01/07/2021    6:29 PM 03/12/2018    9:08 AM 02/21/2017    8:14 AM 02/15/2016    2:48 PM 02/15/2016    2:42 PM 02/15/2016    2:32 PM  Advanced Directives  Does Patient Have a Medical Advance Directive? Yes No Yes Yes Yes  Yes  Type of Estate agent of [place];Living will  Healthcare Power of [place];Living will Healthcare Power of [place];Living will Healthcare Power of [place];Living will -- Healthcare Power of [employer];Living will  Does patient want to make changes to medical advance directive?     No - Patient declined  No - Patient declined  Copy of Healthcare Power of Attorney in Chart? No - copy requested  No - copy requested No - copy requested No - copy requested  No - copy requested    Current Medications  (verified) Outpatient Encounter Medications as of 05/28/2023  Medication Sig   Coenzyme Q10 (COQ-10) 50 MG CAPS Take by mouth daily.   Ginkgo Biloba 40 MG TABS Take 2 tablets (80 mg total) by mouth daily.   metoprolol tartrate (LOPRESSOR) 25 MG tablet TAKE 0.5 TABLETS BY MOUTH 2 TIMES DAILY.   Multiple Vitamin (MULTIVITAMIN) tablet Take 1 tablet by mouth daily.   multivitamin-lutein (OCUVITE-LUTEIN) CAPS capsule Take 1 capsule by mouth daily.   Naproxen Sod-diphenhydrAMINE (ALEVE PM) 220-25 MG TABS Take 1 tablet by mouth at bedtime as needed (insomnia).   POTASSIUM PO Take 500 mg by mouth. Takes 3 times weekly   triamterene-hydrochlorothiazide (MAXZIDE-25) 37.5-25 MG tablet Take 1 tablet by mouth every other day.   Zn-Pyg Afri-Nettle-Saw Palmet (SAW PALMETTO COMPLEX PO) Take 250 mg by mouth every other day.    No facility-administered encounter medications on file as of 05/28/2023.    Allergies (verified) Sulfa antibiotics   History: Past Medical History:  Diagnosis Date   Choledocholithiasis 2009   sepsis s/p ICU stay with VDRF   Chronic fungal otitis externa 2015   [facility])   Colon polyp    adenomatous - [facility]   COPD (chronic obstructive pulmonary disease) (HCC)    DDD (degenerative disc disease)    cervical and lumbar spine   Depression with anxiety    ED (erectile dysfunction)    on viagra   Elevated PSA 2014   s/p normal biopsy 2003, now followed  by Dr. Mena Goes, monitoring   History of alcohol abuse 1989   after bad divorce   History of smoking 2000   50+ PY hx, s/p normal CT chest 2014   HTN (hypertension)    OSA (obstructive sleep apnea)    could not tolerate CPAP   Peyronie disease    Presbycusis of both ears 2015   rec updated binaural amplification   Wears hearing aid    [facility]   Past Surgical History:  Procedure Laterality Date   CARDIAC CATHETERIZATION  2009   WNL per records, [place]   CHOLECYSTECTOMY  2009   COLONOSCOPY  2009    adenomatous polyp, rec rpt 5 yrs ([facility])   COLONOSCOPY  07/2013   no polyps ([facility])   ECTROPION REPAIR Bilateral 04/2018   bilat lower eyelids   hospitalization  2010   ERCP with sphincertotomy and CBD stone removal - septic cholangiis with EtOH withdrawal, E coli bacteremia with septic shock, with arterial thromboembolization with discoloration of toes of R foot   rectal fistula repair  1984   REFRACTIVE SURGERY Right 03/2019   TONSILLECTOMY  1941   VASECTOMY  1969   Family History  Problem Relation Age of Onset   Cancer Father        throat?   Alcohol abuse Father    Stroke Maternal Aunt        hemorrhagic   Stroke Mother        ischemic   Diabetes Neg Hx    Social History   Socioeconomic History   Marital status: Married    Spouse name: Not on file   Number of children: Not on file   Years of education: Not on file   Highest education level: Not on file  Occupational History   Not on file  Tobacco Use   Smoking status: Former    Current packs/day: 0.00    Average packs/day: 1 pack/day for 50.0 years (50.0 ttl pk-yrs)    Types: Cigarettes    Start date: 07/31/1948    Quit date: 07/31/1998    Years since quitting: 24.8   Smokeless tobacco: Never  Vaping Use   Vaping status: Never Used  Substance and Sexual Activity   Alcohol use: Yes    Alcohol/week: 6.0 - 7.0 standard drinks of alcohol    Types: 6 - 7 Glasses of wine per week   Drug use: No   Sexual activity: Yes  Other Topics Concern   Not on file  Social History Narrative   Lives with [name] wife (married since 2005)   Occupation: Medical sales representative, retired   Edu: 18+ yrs   Activity: walking regularly, but not as much as he would like to - wife has trouble keeping up.   Diet: good water, fruits/vegetables daily. Significant sweet tea.   Social Determinants of Health   Financial Resource Strain: Low Risk  (05/28/2023)   Overall Financial Resource Strain (CARDIA)    Difficulty of Paying Living Expenses: Not hard  at all  Food Insecurity: No Food Insecurity (05/28/2023)   Hunger Vital Sign    Worried About Running Out of Food in the Last Year: Never true    Ran Out of Food in the Last Year: Never true  Transportation Needs: No Transportation Needs (05/28/2023)   PRAPARE - Administrator, Civil Service (Medical): No    Lack of Transportation (Non-Medical): No  Physical Activity: Sufficiently Active (05/28/2023)   Exercise Vital Sign    Days of  Exercise per Week: 5 days    Minutes of Exercise per Session: 30 min  Stress: No Stress Concern Present (05/28/2023)   [employer] of Occupational Health - Occupational Stress Questionnaire    Feeling of Stress : Not at all  Social Connections: Moderately Isolated (05/28/2023)   Social Connection and Isolation Panel [NHANES]    Frequency of Communication with Friends and Family: More than three times a week    Frequency of Social Gatherings with Friends and Family: More than three times a week    Attends Religious Services: Never    Database administrator or Organizations: No    Attends Engineer, structural: Never    Marital Status: Married    Tobacco Counseling Counseling given: Not Answered   Clinical Intake:  Pre-visit preparation completed: Yes  Pain : No/denies pain     Nutritional Risks: None Diabetes: No  How often do you need to have someone help you when you read instructions, pamphlets, or other written materials from your doctor or pharmacy?: 1 - Never  Interpreter Needed?: No  Information entered by :: Renie Ora, LPN   Activities of Daily Living    05/28/2023    1:05 PM  In your present state of health, do you have any difficulty performing the following activities:  Hearing? 0  Vision? 0  Difficulty concentrating or making decisions? 0  Walking or climbing stairs? 0  Dressing or bathing? 0  Doing errands, shopping? 0  Preparing Food and eating ? N  Using the Toilet? N  In the past six  months, have you accidently leaked urine? N  Do you have problems with loss of bowel control? N  Managing your Medications? N  Managing your Finances? N  Housekeeping or managing your Housekeeping? N    Patient Care Team: Eustaquio Boyden, MD as PCP - General (Family Medicine) Oletha Cruel, DDS (Dentistry) Marden Noble, MD (Ophthalmology)  Indicate any recent Medical Services you may have received from other than [facility] providers in the past year (date may be approximate).     Assessment:   This is a routine wellness examination for [place].  Hearing/Vision screen Vision Screening - Comments:: Wears rx glasses - up to date with routine eye exams with  Dr.Groat    Goals Addressed               This Visit's Progress     Increase physical activity (pt-stated)   On track     Starting 03/12/2018, I will continue to walk at least 30 min 7 days per week.        Depression Screen    05/28/2023    1:04 PM 04/22/2021    9:23 AM 04/19/2020   12:07 PM 03/19/2019   12:49 PM 03/12/2018    9:09 AM 02/21/2017    8:13 AM 02/15/2016    2:49 PM  PHQ 2/9 Scores  PHQ - 2 Score 0 1 0 0 0 1 0  PHQ- 9 Score  4   0      Fall Risk    05/28/2023    1:03 PM 05/03/2022   11:11 AM 04/22/2021    8:44 AM 03/19/2019   12:49 PM 03/12/2018    9:09 AM  Fall Risk   Falls in the past year? 0 0 1 0 No  Number falls in past yr: 0  1    Injury with Fall? 0  1    Comment   Head injury  Risk for fall due to : No Fall Risks      Follow up Falls prevention discussed        MEDICARE RISK AT HOME: Medicare Risk at Home Any stairs in or around the home?: No If so, are there any without handrails?: No Home free of loose throw rugs in walkways, pet beds, electrical cords, etc?: Yes Adequate lighting in your home to reduce risk of falls?: Yes Life alert?: No Use of a cane, walker or w/c?: No Grab bars in the bathroom?: Yes Shower chair or bench in shower?: Yes Elevated toilet seat or a  handicapped toilet?: Yes  TIMED UP AND GO:  Was the test performed?  No    Cognitive Function:    03/12/2018    9:09 AM 02/21/2017    8:16 AM 02/15/2016    2:38 PM  MMSE - Mini Mental State Exam  Orientation to time 5 5 5   Orientation to Place 5 5 5   Registration 3 3 3   Attention/ Calculation 0 0 0  Recall 3 2 3   Language- name 2 objects 0 0 0  Language- repeat 1 1 1   Language- follow 3 step command 3 3 3   Language- read & follow direction 0 0 0  Write a sentence 0 0 0  Copy design 0 0 0  Total score 20 19 20         05/28/2023    1:06 PM  6CIT Screen  What Year? 0 points  What month? 0 points  What time? 0 points  Count back from 20 0 points  Months in reverse 0 points  Repeat phrase 0 points  Total Score 0 points    Immunizations Immunization History  Administered Date(s) Administered   Fluad Quad(high Dose 65+) 04/01/2022   Hepatitis A 04/14/2003, 10/07/2003   Influenza Whole 04/14/2013   Influenza, High Dose Seasonal PF 04/08/2014, 03/27/2015, 06/04/2017, 04/14/2020, 04/20/2021, 03/26/2023   Influenza,inj,Quad PF,6+ Mos 04/16/2018, 03/19/2019   Influenza-Unspecified 03/20/2016, 04/01/2022   PFIZER Comirnaty(Gray Top)Covid-19 Tri-Sucrose Vaccine 10/28/2020, 04/20/2022   PFIZER(Purple Top)SARS-COV-2 Vaccination 08/19/2019, 09/12/2019, 04/20/2021   Pfizer Covid-19 Vaccine Bivalent Booster 24yrs & up 12/07/2021   Pneumococcal Conjugate-13 02/10/2014   Pneumococcal Polysaccharide-23 08/06/2002   Td 04/14/2003   Tdap 12/13/2010, 01/07/2021   Zoster, Live 12/03/2006    TDAP status: Up to date  Flu Vaccine status: Up to date  Pneumococcal vaccine status: Up to date  Covid-19 vaccine status: Completed vaccines  Qualifies for Shingles Vaccine? Yes   Zostavax completed No   Shingrix Completed?: No.    Education has been provided regarding the importance of this vaccine. Patient has been advised to call insurance company to determine out of pocket expense if  they have not yet received this vaccine. Advised may also receive vaccine at local pharmacy or Health Dept. Verbalized acceptance and understanding.  Screening Tests Health Maintenance  Topic Date Due   Zoster Vaccines- Shingrix (1 of 2) 05/07/1985   COVID-19 Vaccine (7 - 2023-24 season) 04/01/2023   Medicare Annual Wellness (AWV)  05/27/2024   DTaP/Tdap/Td (4 - Td or Tdap) 01/08/2031   Pneumonia Vaccine 68+ Years old  Completed   INFLUENZA VACCINE  Completed   HPV VACCINES  Aged Out    Health Maintenance  Health Maintenance Due  Topic Date Due   Zoster Vaccines- Shingrix (1 of 2) 05/07/1985   COVID-19 Vaccine (7 - 2023-24 season) 04/01/2023    Colorectal cancer screening: No longer required.   Lung Cancer Screening: (Low  Dose CT Chest recommended if Age 72-80 years, 20 pack-year currently smoking OR have quit w/in 15years.) does not qualify.   Lung Cancer Screening Referral: n/a  Additional Screening:  Hepatitis C Screening: does not qualify;   Vision Screening: Recommended annual ophthalmology exams for early detection of glaucoma and other disorders of the eye. Is the patient up to date with their annual eye exam?  Yes  Who is the provider or what is the name of the office in which the patient attends annual eye exams? Dr.groat  If pt is not established with a provider, would they like to be referred to a provider to establish care? No .   Dental Screening: Recommended annual dental exams for proper oral hygiene   Community Resource Referral / Chronic Care Management: CRR required this visit?  No   CCM required this visit?  No     Plan:     I have personally reviewed and noted the following in the patient's chart:   Medical and social history Use of alcohol, tobacco or illicit drugs  Current medications and supplements including opioid prescriptions. Patient is not currently taking opioid prescriptions. Functional ability and status Nutritional  status Physical activity Advanced directives List of other physicians Hospitalizations, surgeries, and ER visits in previous 12 months Vitals Screenings to include cognitive, depression, and falls Referrals and appointments  In addition, I have reviewed and discussed with patient certain preventive protocols, quality metrics, and best practice recommendations. A written personalized care plan for preventive services as well as general preventive health recommendations were provided to patient.     Lorrene Reid, LPN   [id number]   After Visit Summary: (MyChart) Due to this being a telephonic visit, the after visit summary with patients personalized plan was offered to patient via MyChart   Nurse Notes: none

## 2023-06-09 ENCOUNTER — Other Ambulatory Visit: Payer: Self-pay | Admitting: Family Medicine

## 2023-06-09 DIAGNOSIS — I10 Essential (primary) hypertension: Secondary | ICD-10-CM

## 2023-06-09 DIAGNOSIS — D539 Nutritional anemia, unspecified: Secondary | ICD-10-CM

## 2023-06-11 ENCOUNTER — Other Ambulatory Visit (INDEPENDENT_AMBULATORY_CARE_PROVIDER_SITE_OTHER): Payer: Medicare Other

## 2023-06-11 DIAGNOSIS — D539 Nutritional anemia, unspecified: Secondary | ICD-10-CM | POA: Diagnosis not present

## 2023-06-11 DIAGNOSIS — I10 Essential (primary) hypertension: Secondary | ICD-10-CM | POA: Diagnosis not present

## 2023-06-11 LAB — LIPID PANEL
Cholesterol: 156 mg/dL (ref 0–200)
HDL: 71.1 mg/dL (ref >39.00)
LDL Cholesterol: 75 mg/dL (ref 0–99)
NonHDL: 85.39
Total CHOL/HDL Ratio: 2
Triglycerides: 53 mg/dL (ref 0.0–149.0)
VLDL: 10.6 mg/dL (ref 0.0–40.0)

## 2023-06-11 LAB — CBC WITH DIFFERENTIAL/PLATELET
Basophils Absolute: 0 10*3/uL (ref 0.0–0.1)
Basophils Relative: 0.4 % (ref 0.0–3.0)
Eosinophils Absolute: 0.2 10*3/uL (ref 0.0–0.7)
Eosinophils Relative: 2.8 % (ref 0.0–5.0)
HCT: 33.9 % — ABNORMAL LOW (ref 39.0–52.0)
Hemoglobin: 11.5 g/dL — ABNORMAL LOW (ref 13.0–17.0)
Lymphocytes Relative: 31.3 % (ref 12.0–46.0)
Lymphs Abs: 2.1 10*3/uL (ref 0.7–4.0)
MCHC: 34 g/dL (ref 30.0–36.0)
MCV: 106.3 fL — ABNORMAL HIGH (ref 78.0–100.0)
Monocytes Absolute: 0.6 10*3/uL (ref 0.1–1.0)
Monocytes Relative: 9.1 % (ref 3.0–12.0)
Neutro Abs: 3.8 10*3/uL (ref 1.4–7.7)
Neutrophils Relative %: 56.4 % (ref 43.0–77.0)
Platelets: 222 10*3/uL (ref 150.0–400.0)
RBC: 3.19 Mil/uL — ABNORMAL LOW (ref 4.22–5.81)
RDW: 14.3 % (ref 11.5–15.5)
WBC: 6.7 10*3/uL (ref 4.0–10.5)

## 2023-06-11 LAB — COMPREHENSIVE METABOLIC PANEL
ALT: 13 U/L (ref 0–53)
AST: 20 U/L (ref 0–37)
Albumin: 4 g/dL (ref 3.5–5.2)
Alkaline Phosphatase: 49 U/L (ref 39–117)
BUN: 13 mg/dL (ref 6–23)
CO2: 28 meq/L (ref 19–32)
Calcium: 9.5 mg/dL (ref 8.4–10.5)
Chloride: 102 meq/L (ref 96–112)
Creatinine, Ser: 1.18 mg/dL (ref 0.40–1.50)
GFR: 55.25 mL/min — ABNORMAL LOW (ref >60.00)
Glucose, Bld: 124 mg/dL — ABNORMAL HIGH (ref 70–99)
Potassium: 3.6 meq/L (ref 3.5–5.1)
Sodium: 138 meq/L (ref 135–145)
Total Bilirubin: 0.6 mg/dL (ref 0.2–1.2)
Total Protein: 6.3 g/dL (ref 6.0–8.3)

## 2023-06-11 LAB — FOLATE: Folate: >24.2 ng/mL (ref >5.9)

## 2023-06-11 LAB — TSH: TSH: 5.46 u[IU]/mL (ref 0.35–5.50)

## 2023-06-11 LAB — VITAMIN B12: Vitamin B-12: 421 pg/mL (ref 211–911)

## 2023-06-11 LAB — SEDIMENTATION RATE: Sed Rate: 14 mm/h (ref 0–20)

## 2023-06-18 ENCOUNTER — Ambulatory Visit: Payer: Medicare Other | Admitting: Family Medicine

## 2023-06-18 ENCOUNTER — Encounter: Payer: Self-pay | Admitting: Family Medicine

## 2023-06-18 VITALS — BP 126/66 | HR 64 | Temp 97.9°F | Ht 65.25 in | Wt 157.5 lb

## 2023-06-18 DIAGNOSIS — N289 Disorder of kidney and ureter, unspecified: Secondary | ICD-10-CM | POA: Diagnosis not present

## 2023-06-18 DIAGNOSIS — Z0000 Encounter for general adult medical examination without abnormal findings: Secondary | ICD-10-CM | POA: Diagnosis not present

## 2023-06-18 DIAGNOSIS — D539 Nutritional anemia, unspecified: Secondary | ICD-10-CM | POA: Diagnosis not present

## 2023-06-18 DIAGNOSIS — I10 Essential (primary) hypertension: Secondary | ICD-10-CM | POA: Diagnosis not present

## 2023-06-18 DIAGNOSIS — Z87898 Personal history of other specified conditions: Secondary | ICD-10-CM

## 2023-06-18 DIAGNOSIS — M5432 Sciatica, left side: Secondary | ICD-10-CM

## 2023-06-18 DIAGNOSIS — J449 Chronic obstructive pulmonary disease, unspecified: Secondary | ICD-10-CM

## 2023-06-18 MED ORDER — POTASSIUM 99 MG PO TABS: 99.0000 mg | ORAL_TABLET | ORAL | Status: DC

## 2023-06-18 MED ORDER — TRIAMTERENE-HCTZ 37.5-25 MG PO TABS: 1.0000 | ORAL_TABLET | ORAL | 4 refills | Status: DC

## 2023-06-18 MED ORDER — METOPROLOL TARTRATE 25 MG PO TABS: 12.5000 mg | ORAL_TABLET | Freq: Two times a day (BID) | ORAL | 4 refills | Status: DC

## 2023-06-18 MED ORDER — PRESERVISION AREDS 2 PO CAPS: 1.0000 | ORAL_CAPSULE | Freq: Every day | ORAL | Status: AC

## 2023-06-18 NOTE — Assessment & Plan Note (Signed)
Chronic, stable on current regimen - continue. 

## 2023-06-18 NOTE — Assessment & Plan Note (Addendum)
Stable period off respiratory medication.  Quit smoking remotely.

## 2023-06-18 NOTE — Assessment & Plan Note (Signed)
Regularly sees ortho for this.

## 2023-06-18 NOTE — Assessment & Plan Note (Signed)
Ongoing , deteriorated macrocytic anemia, folate and b12 levels normal.  Stable periph smear latest 2023.  H/o alcohol use but not in the past 2 years.  No red flags. He declines hematology eval.  Check SPEP as per below.

## 2023-06-18 NOTE — Assessment & Plan Note (Signed)
Remains abstinent since 11/2020.

## 2023-06-18 NOTE — Assessment & Plan Note (Signed)
Progressive decline noted over the past 2 years with GFR from 70s to 55.  In chronic anemia, check SPEP.  Denies bone pain, constitutional symptoms.

## 2023-06-18 NOTE — Assessment & Plan Note (Signed)
Preventative protocols reviewed and updated unless pt declined. Discussed healthy diet and lifestyle.  

## 2023-06-18 NOTE — Patient Instructions (Signed)
Labs today  Good to see you today  Continue current medicines. Return as needed or in 1 year for next physical.

## 2023-06-18 NOTE — Progress Notes (Signed)
Ph: [phone] Fax: [phone]   Patient ID: [name], male    DOB: 08-14-1932, 89 y.o.   MRN: [id number]  This visit was conducted in person.  BP 126/66   Pulse 64   Temp 97.9 F (36.6 C) (Oral)   Ht 5' 5.25" (1.657 m)   Wt 157 lb 8 oz (71.4 kg)   SpO2 98%   BMI 26.01 kg/m    CC: CPE Subjective:   HPI: [name] is a 89 y.o. male presenting on 06/18/2023 for Annual Exam (MCR prt 2 [AWV- 05/28/23].)   Saw health advisor last month for medicare wellness visit. Note reviewed.   No results found.  Flowsheet Row Clinical Support from 05/28/2023 in [facility] [place]  PHQ-2 Total Score 0          05/28/2023    1:03 PM 05/03/2022   11:11 AM 04/22/2021    8:44 AM 03/19/2019   12:49 PM 03/12/2018    9:09 AM  Fall Risk   Falls in the past year? 0 0 1 0 No  Number falls in past yr: 0  1    Injury with Fall? 0  1    Comment   Head injury    Risk for fall due to : No Fall Risks      Follow up Falls prevention discussed         Wife has Parkinson's disease and CHF, now followed by Dr Tat. Aide comes out to help 3d/wk.   Ongoing sciatica sees Dr Dion Saucier. Uses walker or cane regularly.    Progressive macrocytic anemia - denies blood in stool or urine, fevers/chills, night sweats, swollen glands, unexpected weight loss (although he is down 10 lbs in the past year). No bone pain.    Preventative: COLONOSCOPY Date: 07/2013 no polyps [name]  Prostate - elevated PSA remotely with normal biopsy per patient - this was followed by Dr. Mena Goes in past Q6 mo active surveillance. Has not returned since 01/2013. Does not want to return to uro. Latest labs PSA had normalized, aged out since.  Lung cancer screening - not eligible Flu shot - yearly  Tetanus 2010, Tdap 2012 Pneumovax 07/2002. Prevnar-13: 01/2014 zostavax 2008 COVID vaccine - Pfizer 08/2019, 09/2019 - booster 09/2020, bivalent 03/2021, again 11/2021 and monovalent 03/2022 Zostavax  2008 Shingrix - discussed, declines  Advanced directives: has this at home. Wife [name] would be HCPOA. States [facility] has this - I advised I don't see it in his chart. Has declined bringing  [place] a copy.  Seat belt use discussed  Sunscreen use discussed. Wears a hat when outdoors.  Ex-smoker - quit 2000 (50 PY hx) Alcohol - previously drank 16 oz chardonnay daily. Fully quit 11/2020 - attributes weight loss to stopping alcohol.  Dentist - Q4 mo  Eye exam - yearly  Bowel - no constipation  Bladder - no incontinence    Lives with [name] wife (married since 2005)  Occupation: Medical sales representative, retired  Edu: 18+ yrs  Activity: uses stationary bike daily (3-4 mi) Diet: good water, fruits/vegetables daily. Follows mediterranean diet. Drinks unsweet tea, avoid soft drinks.     Relevant past medical, surgical, family and social history reviewed and updated as indicated. Interim medical history since our last visit reviewed. Allergies and medications reviewed and updated. Outpatient Medications Prior to Visit  Medication Sig Dispense Refill   Coenzyme Q10 (COQ-10) 50 MG CAPS Take by mouth daily.     Ginkgo  Biloba 40 MG TABS Take 2 tablets (80 mg total) by mouth daily.     Multiple Vitamin (MULTIVITAMIN) tablet Take 1 tablet by mouth daily.     Zn-Pyg Afri-Nettle-Saw Palmet (SAW PALMETTO COMPLEX PO) Take 250 mg by mouth every other day.      metoprolol tartrate (LOPRESSOR) 25 MG tablet TAKE 0.5 TABLETS BY MOUTH 2 TIMES DAILY. 90 tablet 0   multivitamin-lutein (OCUVITE-LUTEIN) CAPS capsule Take 1 capsule by mouth daily.     POTASSIUM PO Take 500 mg by mouth. Takes 3 times weekly     triamterene-hydrochlorothiazide (MAXZIDE-25) 37.5-25 MG tablet Take 1 tablet by mouth every other day. 45 tablet 0   Naproxen Sod-diphenhydrAMINE (ALEVE PM) 220-25 MG TABS Take 1 tablet by mouth at bedtime as needed (insomnia).     No facility-administered medications prior to visit.     Per HPI unless specifically  indicated in ROS section below Review of Systems  Constitutional:  Negative for activity change, appetite change, chills, fatigue, fever and unexpected weight change.  HENT:  Negative for hearing loss.   Eyes:  Negative for visual disturbance.  Respiratory:  Negative for cough, chest tightness, shortness of breath and wheezing.   Cardiovascular:  Negative for chest pain, palpitations and leg swelling.  Gastrointestinal:  Positive for diarrhea (isolated episode last week). Negative for abdominal distention, abdominal pain, blood in stool, constipation, nausea and vomiting.  Genitourinary:  Negative for difficulty urinating and hematuria.  Musculoskeletal:  Negative for arthralgias, myalgias and neck pain.  Skin:  Negative for rash.  Neurological:  Negative for dizziness, seizures, syncope and headaches.  Hematological:  Negative for adenopathy. Bruises/bleeds easily.  Psychiatric/Behavioral:  Negative for dysphoric mood. The patient is not nervous/anxious.     Objective:  BP 126/66   Pulse 64   Temp 97.9 F (36.6 C) (Oral)   Ht 5' 5.25" (1.657 m)   Wt 157 lb 8 oz (71.4 kg)   SpO2 98%   BMI 26.01 kg/m   Wt Readings from Last 3 Encounters:  06/18/23 157 lb 8 oz (71.4 kg)  05/28/23 154 lb (69.9 kg)  05/03/22 162 lb (73.5 kg)      Physical Exam Vitals and nursing note reviewed.  Constitutional:      General: He is not in acute distress.    Appearance: Normal appearance. He is well-developed. He is not ill-appearing.  HENT:     Head: Normocephalic and atraumatic.     Right Ear: Hearing, tympanic membrane, ear canal and external ear normal.     Left Ear: Hearing, tympanic membrane, ear canal and external ear normal.     Mouth/Throat:     Mouth: Mucous membranes are moist.     Pharynx: Oropharynx is clear. No oropharyngeal exudate or posterior oropharyngeal erythema.  Eyes:     General: No scleral icterus.    Extraocular Movements: Extraocular movements intact.      Conjunctiva/sclera: Conjunctivae normal.     Pupils: Pupils are equal, round, and reactive to light.  Neck:     Thyroid: No thyroid mass or thyromegaly.     Vascular: No carotid bruit.  Cardiovascular:     Rate and Rhythm: Normal rate and regular rhythm.     Pulses: Normal pulses.          Radial pulses are 2+ on the right side and 2+ on the left side.     Heart sounds: Normal heart sounds. No murmur heard. Pulmonary:     Effort: Pulmonary effort is  normal. No respiratory distress.     Breath sounds: Normal breath sounds. No wheezing, rhonchi or rales.  Abdominal:     General: Bowel sounds are normal. There is no distension.     Palpations: Abdomen is soft. There is no mass.     Tenderness: There is no abdominal tenderness. There is no guarding or rebound.     Hernia: No hernia is present.  Musculoskeletal:        General: Normal range of motion.     Cervical back: Normal range of motion and neck supple.     Right lower leg: No edema.     Left lower leg: No edema.  Lymphadenopathy:     Cervical: No cervical adenopathy.  Skin:    General: Skin is warm and dry.     Findings: No rash.  Neurological:     General: No focal deficit present.     Mental Status: He is alert and oriented to person, place, and time.  Psychiatric:        Mood and Affect: Mood normal.        Behavior: Behavior normal.        Thought Content: Thought content normal.        Judgment: Judgment normal.       Results for orders placed or performed in visit on 06/11/23  Sedimentation rate  Result Value Ref Range   Sed Rate 14 0 - 20 mm/hr  Folate  Result Value Ref Range   Folate >24.2 >5.9 ng/mL  Vitamin B12  Result Value Ref Range   Vitamin B-12 421 211 - 911 pg/mL  CBC with Differential/Platelet  Result Value Ref Range   WBC 6.7 4.0 - 10.5 K/uL   RBC 3.19 (L) 4.22 - 5.81 Mil/uL   Hemoglobin 11.5 (L) 13.0 - 17.0 g/dL   HCT 95.2 (L) 84.1 - 32.4 %   MCV 106.3 (H) 78.0 - 100.0 fl   MCHC 34.0 30.0 -  36.0 g/dL   RDW 40.1 02.7 - 25.3 %   Platelets 222.0 150.0 - 400.0 K/uL   Neutrophils Relative % 56.4 43.0 - 77.0 %   Lymphocytes Relative 31.3 12.0 - 46.0 %   Monocytes Relative 9.1 3.0 - 12.0 %   Eosinophils Relative 2.8 0.0 - 5.0 %   Basophils Relative 0.4 0.0 - 3.0 %   Neutro Abs 3.8 1.4 - 7.7 K/uL   Lymphs Abs 2.1 0.7 - 4.0 K/uL   Monocytes Absolute 0.6 0.1 - 1.0 K/uL   Eosinophils Absolute 0.2 0.0 - 0.7 K/uL   Basophils Absolute 0.0 0.0 - 0.1 K/uL  TSH  Result Value Ref Range   TSH 5.46 0.35 - 5.50 uIU/mL  Comprehensive metabolic panel  Result Value Ref Range   Sodium 138 135 - 145 mEq/L   Potassium 3.6 3.5 - 5.1 mEq/L   Chloride 102 96 - 112 mEq/L   CO2 28 19 - 32 mEq/L   Glucose, Bld 124 (H) 70 - 99 mg/dL   BUN 13 6 - 23 mg/dL   Creatinine, Ser 6.64 0.40 - 1.50 mg/dL   Total Bilirubin 0.6 0.2 - 1.2 mg/dL   Alkaline Phosphatase 49 39 - 117 U/L   AST 20 0 - 37 U/L   ALT 13 0 - 53 U/L   Total Protein 6.3 6.0 - 8.3 g/dL   Albumin 4.0 3.5 - 5.2 g/dL   GFR 40.34 (L) >74.25 mL/min   Calcium 9.5 8.4 - 10.5 mg/dL  Lipid panel  Result Value Ref Range   Cholesterol 156 0 - 200 mg/dL   Triglycerides 78.2 0.0 - 149.0 mg/dL   HDL 95.62 >13.08 mg/dL   VLDL 65.7 0.0 - 84.6 mg/dL   LDL Cholesterol 75 0 - 99 mg/dL   Total CHOL/HDL Ratio 2    NonHDL 85.39     Assessment & Plan:   Problem List Items Addressed This Visit     Health maintenance examination - Primary (Chronic)    Preventative protocols reviewed and updated unless pt declined. Discussed healthy diet and lifestyle.       HTN (hypertension)    Chronic, stable on current regimen - continue.       Relevant Medications   metoprolol tartrate (LOPRESSOR) 25 MG tablet   triamterene-hydrochlorothiazide (MAXZIDE-25) 37.5-25 MG tablet   History of alcohol use    Remains abstinent since 11/2020.       COPD (chronic obstructive pulmonary disease) (HCC)    Stable period off respiratory medication.  Quit smoking  remotely.       Macrocytic anemia    Ongoing , deteriorated macrocytic anemia, folate and b12 levels normal.  Stable periph smear latest 2023.  H/o alcohol use but not in the past 2 years.  No red flags. He declines hematology eval.  Check SPEP as per below.       Relevant Orders   Serum protein electrophoresis with reflex   Chronic sciatica of left side    Regularly sees ortho for this.       Renal insufficiency    Progressive decline noted over the past 2 years with GFR from 70s to 55.  In chronic anemia, check SPEP.  Denies bone pain, constitutional symptoms.       Relevant Orders   Serum protein electrophoresis with reflex     Meds ordered this encounter  Medications   metoprolol tartrate (LOPRESSOR) 25 MG tablet    Sig: Take 0.5 tablets (12.5 mg total) by mouth 2 (two) times daily.    Dispense:  90 tablet    Refill:  4   triamterene-hydrochlorothiazide (MAXZIDE-25) 37.5-25 MG tablet    Sig: Take 1 tablet by mouth every other day.    Dispense:  45 tablet    Refill:  4   Multiple Vitamins-Minerals (PRESERVISION AREDS 2) CAPS    Sig: Take 1 capsule by mouth daily.   Potassium 99 MG TABS    Sig: Take 1 tablet (99 mg total) by mouth every Monday, Wednesday, and Friday.    Orders Placed This Encounter  Procedures   Serum protein electrophoresis with reflex    Patient Instructions  Labs today Good to see you today Continue current medicines Return as needed or in1 year for next physical   Follow up plan: Return in about 1 year (around 06/17/2024) for medicare wellness visit, annual exam, prior fasting for blood work.  Eustaquio Boyden, MD

## 2023-06-22 LAB — IFE INTERPRETATION

## 2023-06-22 LAB — PROTEIN ELECTROPHORESIS, SERUM, WITH REFLEX
Albumin ELP: 3.8 g/dL (ref 3.8–4.8)
Alpha 1: 0.3 g/dL (ref 0.2–0.3)
Alpha 2: 0.9 g/dL (ref 0.5–0.9)
Beta 2: 0.3 g/dL (ref 0.2–0.5)
Beta Globulin: 0.4 g/dL (ref 0.4–0.6)
Gamma Globulin: 0.7 g/dL — ABNORMAL LOW (ref 0.8–1.7)
Total Protein: 6.4 g/dL (ref 6.1–8.1)

## 2024-03-19 ENCOUNTER — Encounter (HOSPITAL_COMMUNITY): Payer: Self-pay

## 2024-03-19 ENCOUNTER — Inpatient Hospital Stay (HOSPITAL_COMMUNITY)
Admission: EM | Admit: 2024-03-19 | Discharge: 2024-03-23 | DRG: 603 | Disposition: A | Discharge status: Home / Self Care | Attending: Internal Medicine | Admitting: Internal Medicine

## 2024-03-19 ENCOUNTER — Ambulatory Visit: Payer: Self-pay

## 2024-03-19 ENCOUNTER — Emergency Department (HOSPITAL_COMMUNITY)

## 2024-03-19 ENCOUNTER — Other Ambulatory Visit: Payer: Self-pay

## 2024-03-19 ENCOUNTER — Ambulatory Visit (HOSPITAL_COMMUNITY)
Admission: EM | Admit: 2024-03-19 | Discharge: 2024-03-19 | Disposition: A | Discharge status: Home / Self Care | Attending: Family Medicine | Admitting: Family Medicine

## 2024-03-19 ENCOUNTER — Encounter (HOSPITAL_COMMUNITY): Payer: Self-pay | Admitting: Emergency Medicine

## 2024-03-19 DIAGNOSIS — L03221 Cellulitis of neck: Secondary | ICD-10-CM | POA: Diagnosis present

## 2024-03-19 DIAGNOSIS — L03114 Cellulitis of left upper limb: Secondary | ICD-10-CM | POA: Diagnosis present

## 2024-03-19 DIAGNOSIS — L0390 Cellulitis, unspecified: Secondary | ICD-10-CM | POA: Diagnosis present

## 2024-03-19 DIAGNOSIS — T148XXA Other injury of unspecified body region, initial encounter: Principal | ICD-10-CM

## 2024-03-19 DIAGNOSIS — Z79899 Other long term (current) drug therapy: Secondary | ICD-10-CM

## 2024-03-19 DIAGNOSIS — Z87891 Personal history of nicotine dependence: Secondary | ICD-10-CM | POA: Diagnosis not present

## 2024-03-19 DIAGNOSIS — E876 Hypokalemia: Secondary | ICD-10-CM | POA: Insufficient documentation

## 2024-03-19 DIAGNOSIS — D529 Folate deficiency anemia, unspecified: Secondary | ICD-10-CM | POA: Diagnosis present

## 2024-03-19 DIAGNOSIS — E860 Dehydration: Secondary | ICD-10-CM | POA: Diagnosis present

## 2024-03-19 DIAGNOSIS — E871 Hypo-osmolality and hyponatremia: Secondary | ICD-10-CM | POA: Diagnosis present

## 2024-03-19 DIAGNOSIS — M542 Cervicalgia: Secondary | ICD-10-CM | POA: Diagnosis present

## 2024-03-19 DIAGNOSIS — Z8679 Personal history of other diseases of the circulatory system: Secondary | ICD-10-CM | POA: Diagnosis present

## 2024-03-19 DIAGNOSIS — Z823 Family history of stroke: Secondary | ICD-10-CM | POA: Diagnosis not present

## 2024-03-19 DIAGNOSIS — R296 Repeated falls: Secondary | ICD-10-CM | POA: Diagnosis present

## 2024-03-19 DIAGNOSIS — Z860100 Personal history of colon polyps, unspecified: Secondary | ICD-10-CM

## 2024-03-19 DIAGNOSIS — M609 Myositis, unspecified: Secondary | ICD-10-CM | POA: Diagnosis present

## 2024-03-19 DIAGNOSIS — E872 Acidosis, unspecified: Secondary | ICD-10-CM | POA: Diagnosis present

## 2024-03-19 DIAGNOSIS — D509 Iron deficiency anemia, unspecified: Secondary | ICD-10-CM | POA: Diagnosis present

## 2024-03-19 DIAGNOSIS — J449 Chronic obstructive pulmonary disease, unspecified: Secondary | ICD-10-CM | POA: Diagnosis present

## 2024-03-19 DIAGNOSIS — I959 Hypotension, unspecified: Secondary | ICD-10-CM | POA: Diagnosis present

## 2024-03-19 DIAGNOSIS — I10 Essential (primary) hypertension: Secondary | ICD-10-CM | POA: Diagnosis present

## 2024-03-19 DIAGNOSIS — Z9049 Acquired absence of other specified parts of digestive tract: Secondary | ICD-10-CM | POA: Diagnosis not present

## 2024-03-19 DIAGNOSIS — D539 Nutritional anemia, unspecified: Secondary | ICD-10-CM | POA: Diagnosis present

## 2024-03-19 DIAGNOSIS — L089 Local infection of the skin and subcutaneous tissue, unspecified: Principal | ICD-10-CM

## 2024-03-19 DIAGNOSIS — Z811 Family history of alcohol abuse and dependence: Secondary | ICD-10-CM

## 2024-03-19 DIAGNOSIS — F32A Depression, unspecified: Secondary | ICD-10-CM | POA: Diagnosis present

## 2024-03-19 DIAGNOSIS — G4733 Obstructive sleep apnea (adult) (pediatric): Secondary | ICD-10-CM | POA: Diagnosis present

## 2024-03-19 DIAGNOSIS — N179 Acute kidney failure, unspecified: Secondary | ICD-10-CM | POA: Diagnosis present

## 2024-03-19 DIAGNOSIS — Z882 Allergy status to sulfonamides status: Secondary | ICD-10-CM

## 2024-03-19 DIAGNOSIS — M5430 Sciatica, unspecified side: Secondary | ICD-10-CM | POA: Diagnosis present

## 2024-03-19 DIAGNOSIS — R221 Localized swelling, mass and lump, neck: Secondary | ICD-10-CM

## 2024-03-19 HISTORY — DX: Cellulitis of neck: L03.221

## 2024-03-19 LAB — COMPREHENSIVE METABOLIC PANEL WITH GFR
ALT: 34 U/L (ref 0–44)
AST: 101 U/L — ABNORMAL HIGH (ref 15–41)
Albumin: 3.4 g/dL — ABNORMAL LOW (ref 3.5–5.0)
Alkaline Phosphatase: 59 U/L (ref 38–126)
Anion gap: 14 (ref 5–15)
BUN: 28 mg/dL — ABNORMAL HIGH (ref 8–23)
CO2: 23 mmol/L (ref 22–32)
Calcium: 9.6 mg/dL (ref 8.9–10.3)
Chloride: 97 mmol/L — ABNORMAL LOW (ref 98–111)
Creatinine, Ser: 1.91 mg/dL — ABNORMAL HIGH (ref 0.61–1.24)
GFR, Estimated: 33 mL/min — ABNORMAL LOW (ref >60)
Glucose, Bld: 138 mg/dL — ABNORMAL HIGH (ref 70–99)
Potassium: 3.2 mmol/L — ABNORMAL LOW (ref 3.5–5.1)
Sodium: 134 mmol/L — ABNORMAL LOW (ref 135–145)
Total Bilirubin: 1.8 mg/dL — ABNORMAL HIGH (ref 0.0–1.2)
Total Protein: 6.6 g/dL (ref 6.5–8.1)

## 2024-03-19 LAB — URINALYSIS, W/ REFLEX TO CULTURE (INFECTION SUSPECTED)
Bacteria, UA: NONE SEEN
Bilirubin Urine: NEGATIVE
Glucose, UA: NEGATIVE mg/dL
Ketones, ur: NEGATIVE mg/dL
Leukocytes,Ua: NEGATIVE
Nitrite: NEGATIVE
Protein, ur: 30 mg/dL — AB
Specific Gravity, Urine: 1.026 (ref 1.005–1.030)
pH: 5 (ref 5.0–8.0)

## 2024-03-19 LAB — CBC WITH DIFFERENTIAL/PLATELET
Basophils Absolute: 0 K/uL (ref 0.0–0.1)
Basophils Relative: 0 %
Eosinophils Absolute: 0.2 K/uL (ref 0.0–0.5)
Eosinophils Relative: 1 %
HCT: 34.1 % — ABNORMAL LOW (ref 39.0–52.0)
Hemoglobin: 11.7 g/dL — ABNORMAL LOW (ref 13.0–17.0)
Lymphocytes Relative: 3 %
Lymphs Abs: 0.6 K/uL — ABNORMAL LOW (ref 0.7–4.0)
MCH: 35.7 pg — ABNORMAL HIGH (ref 26.0–34.0)
MCHC: 34.3 g/dL (ref 30.0–36.0)
MCV: 104 fL — ABNORMAL HIGH (ref 80.0–100.0)
Monocytes Absolute: 0.6 K/uL (ref 0.1–1.0)
Monocytes Relative: 3 %
Neutro Abs: 17.4 K/uL — ABNORMAL HIGH (ref 1.7–7.7)
Neutrophils Relative %: 93 %
Platelets: 185 K/uL (ref 150–400)
RBC: 3.28 MIL/uL — ABNORMAL LOW (ref 4.22–5.81)
RDW: 13.5 % (ref 11.5–15.5)
WBC: 18.7 K/uL — ABNORMAL HIGH (ref 4.0–10.5)
nRBC: 0 % (ref 0.0–0.2)

## 2024-03-19 LAB — I-STAT CG4 LACTIC ACID, ED: Lactic Acid, Venous: 2 mmol/L (ref 0.5–1.9)

## 2024-03-19 MED ORDER — LACTATED RINGERS IV SOLN: INTRAVENOUS | Status: AC

## 2024-03-19 MED ORDER — IOHEXOL 350 MG/ML SOLN
75.0000 mL | Freq: Once | INTRAVENOUS | Status: AC | PRN
  Administered 2024-03-19: 75 mL via INTRAVENOUS

## 2024-03-19 MED ORDER — LACTATED RINGERS IV BOLUS
1000.0000 mL | Freq: Once | INTRAVENOUS | Status: AC
  Administered 2024-03-19: 1000 mL via INTRAVENOUS

## 2024-03-19 MED ORDER — SODIUM CHLORIDE 0.9 % IV SOLN
3.0000 g | Freq: Once | INTRAVENOUS | Status: AC
  Administered 2024-03-19: 3 g via INTRAVENOUS
  Filled 2024-03-19: qty 8

## 2024-03-19 NOTE — Telephone Encounter (Signed)
 Do agree with urgent evaluation for both of these issues. Would recommend going to [facility] at [facility] which is closest location to him.

## 2024-03-19 NOTE — ED Provider Notes (Signed)
 [facility]  SURGICAL Provider Note  CSN: [id number] Arrival date & time: 03/19/24 1700  Chief Complaint(s) Fall, Neck Pain, and Hypotension  HPI Kevin HELLINGER is a 89 y.o. male with PMH COPD, alcohol abuse, HTN who presents emergency room for evaluation of a fall with neck pain and swelling.  Patient states that approximately 1 week ago he fell opening a car door and it struck him in the neck.  He started to get swelling a few days later and now is having skin sloughing and erythema over the left lateral neck.  Also had 2 additional falls today.  Denies numbness, tingling, weakness or other neurologic complaints.  States that he feels fatigued but denies chest pain, shortness of breath, headache, fever or other systemic symptoms.   Past Medical History Past Medical History:  Diagnosis Date   Choledocholithiasis 2009   sepsis s/p ICU stay with VDRF   Chronic fungal otitis externa 2015   Gaylen)   Colon polyp    adenomatous - Ganem   COPD (chronic obstructive pulmonary disease) (HCC)    DDD (degenerative disc disease)    cervical and lumbar spine   Depression with anxiety    ED (erectile dysfunction)    on viagra    Elevated PSA 2014   s/p normal biopsy 2003, now followed by Dr. Nieves, monitoring   History of alcohol abuse 1989   after bad divorce   History of smoking 2000   50+ PY hx, s/p normal CT chest 2014   HTN (hypertension)    OSA (obstructive sleep apnea)    could not tolerate CPAP   Peyronie disease    Presbycusis of both ears 2015   rec updated binaural amplification   Wears hearing aid    [employer]   Patient Active Problem List   Diagnosis Date Noted   Cellulitis 03/19/2024   Renal insufficiency 06/18/2023   Chronic sciatica of left side 05/04/2022   Gassiness 05/04/2022   Unsteadiness on feet 04/22/2021   Skin lesions 04/20/2020   Memory deficit 03/19/2019   Ejaculatory disorder 03/17/2018   Abnormal drug screen  09/17/2017   Positive test for herpes simplex virus (HSV) antibody 05/12/2015   Health maintenance examination 03/11/2015   Advanced care planning/counseling discussion 03/11/2015   Macrocytic anemia 02/06/2015   Presbycusis of both ears    Medicare annual wellness visit, subsequent 02/10/2014   Insomnia 11/25/2013   HTN (hypertension)    Elevated PSA    History of smoking    History of alcohol use    COPD (chronic obstructive pulmonary disease) (HCC)    Home Medication(s) Prior to Admission medications   Medication Sig Start Date End Date Taking? Authorizing Provider  Coenzyme Q10 (COQ-10) 50 MG CAPS Take by mouth daily.   Yes [provider]  Ginkgo Biloba 40 MG TABS Take 2 tablets (80 mg total) by mouth daily. 04/08/19  Yes [name], MD  ibuprofen (ADVIL) 200 MG tablet Take 200 mg by mouth every 6 (six) hours as needed for mild pain (pain score 1-3) (Leg pain).   Yes [provider]  melatonin 3 MG TABS tablet Take 3 mg by mouth at bedtime.   Yes [provider]  metoprolol  tartrate (LOPRESSOR ) 25 MG tablet Take 0.5 tablets (12.5 mg total) by mouth 2 (two) times daily. Patient taking differently: Take 25 mg by mouth daily. 06/18/23  Yes [name], MD  Multiple Vitamin (MULTIVITAMIN) tablet Take 1 tablet by mouth daily.  Yes [provider]  Multiple Vitamins-Minerals (PRESERVISION AREDS 2) CAPS Take 1 capsule by mouth daily. 06/18/23  Yes [name], MD  Potassium 99 MG TABS Take 1 tablet (99 mg total) by mouth every Monday, Wednesday, and Friday. 06/18/23  Yes [name], MD  triamterene -hydrochlorothiazide (MAXZIDE-25) 37.5-25 MG tablet Take 1 tablet by mouth every other day. 06/18/23  Yes [name], MD  Zn-Pyg Afri-Nettle-Saw Palmet (SAW PALMETTO COMPLEX PO) Take 250 mg by mouth every other day.    Yes [provider]                                                                                                                                     Past Surgical History Past Surgical History:  Procedure Laterality Date   CARDIAC CATHETERIZATION  2009   WNL per records, [place]   CHOLECYSTECTOMY  2009   COLONOSCOPY  2009   adenomatous polyp, rec rpt 5 yrs (Ganem)   COLONOSCOPY  07/2013   no polyps (Ganem)   ECTROPION REPAIR Bilateral 04/2018   bilat lower eyelids   hospitalization  2010   ERCP with sphincertotomy and CBD stone removal - septic cholangiis with EtOH withdrawal, E coli bacteremia with septic shock, with arterial thromboembolization with discoloration of toes of R foot   rectal fistula repair  1984   REFRACTIVE SURGERY Right 03/2019   TONSILLECTOMY  1941   VASECTOMY  1969   Family History Family History  Problem Relation Age of Onset   Cancer Father        throat?   Alcohol abuse Father    Stroke Maternal Aunt        hemorrhagic   Stroke Mother        ischemic   Diabetes Neg Hx     Social History Social History   Tobacco Use   Smoking status: Former    Current packs/day: 0.00    Average packs/day: 1 pack/day for 50.0 years (50.0 ttl pk-yrs)    Types: Cigarettes    Start date: 07/31/1948    Quit date: 07/31/1998    Years since quitting: 25.6   Smokeless tobacco: Never  Vaping Use   Vaping status: Never Used  Substance Use Topics   Alcohol use: Yes    Alcohol/week: 6.0 - 7.0 standard drinks of alcohol    Types: 6 - 7 Glasses of wine per week   Drug use: No   Allergies Sulfa antibiotics  Review of Systems Review of Systems  Musculoskeletal:  Positive for neck pain.  Skin:  Positive for rash and wound.    Physical Exam Vital Signs  I have reviewed the triage vital signs BP (!) 146/60 (BP Location: Left Arm)   Pulse 96   Temp 98 F (36.7 C)   Resp 16   Ht 5' 6 (1.676 m)   Wt 67.1 kg   SpO2 98%   BMI 23.89 kg/m  Physical Exam Constitutional:      General: He is not in acute distress.    Appearance: Normal appearance.  HENT:      Head: Normocephalic and atraumatic.     Nose: No congestion or rhinorrhea.  Eyes:     General:        Right eye: No discharge.        Left eye: No discharge.     Extraocular Movements: Extraocular movements intact.     Pupils: Pupils are equal, round, and reactive to light.  Neck:     Comments: Overlying skin erythema, sloughing hematoma, induration Cardiovascular:     Rate and Rhythm: Normal rate and regular rhythm.     Heart sounds: No murmur heard. Pulmonary:     Effort: No respiratory distress.     Breath sounds: No wheezing or rales.  Abdominal:     General: There is no distension.     Tenderness: There is no abdominal tenderness.  Musculoskeletal:        General: Normal range of motion.     Cervical back: Normal range of motion.  Skin:    General: Skin is warm and dry.  Neurological:     General: No focal deficit present.     Mental Status: He is alert.     ED Results and Treatments Labs (all labs ordered are listed, but only abnormal results are displayed) Labs Reviewed  COMPREHENSIVE METABOLIC PANEL WITH GFR - Abnormal; Notable for the following components:      Result Value   Sodium 134 (*)    Potassium 3.2 (*)    Chloride 97 (*)    Glucose, Bld 138 (*)    BUN 28 (*)    Creatinine, Ser 1.91 (*)    Albumin 3.4 (*)    AST 101 (*)    Total Bilirubin 1.8 (*)    GFR, Estimated 33 (*)    All other components within normal limits  CBC WITH DIFFERENTIAL/PLATELET - Abnormal; Notable for the following components:   WBC 18.7 (*)    RBC 3.28 (*)    Hemoglobin 11.7 (*)    HCT 34.1 (*)    MCV 104.0 (*)    MCH 35.7 (*)    Neutro Abs 17.4 (*)    Lymphs Abs 0.6 (*)    All other components within normal limits  URINALYSIS, W/ REFLEX TO CULTURE (INFECTION SUSPECTED) - Abnormal; Notable for the following components:   Color, Urine AMBER (*)    APPearance HAZY (*)    Hgb urine dipstick MODERATE (*)    Protein, ur 30 (*)    All other components within normal limits   I-STAT CG4 LACTIC ACID, ED - Abnormal; Notable for the following components:   Lactic Acid, Venous 2.0 (*)    All other components within normal limits  I-STAT CG4 LACTIC ACID, ED  Radiology CT C-SPINE NO CHARGE Result Date: 03/19/2024 CLINICAL DATA:  Left lateral neck pain after frequent falls. EXAM: CT CERVICAL SPINE WITHOUT CONTRAST TECHNIQUE: Multidetector CT imaging of the cervical spine was performed without intravenous contrast. Multiplanar CT image reconstructions were also generated. RADIATION DOSE REDUCTION: This exam was performed according to the departmental dose-optimization program which includes automated exposure control, adjustment of the mA and/or kV according to patient size and/or use of iterative reconstruction technique. COMPARISON:  None Available. FINDINGS: Alignment: No substantial sagittal subluxation. Skull base and vertebrae: No evidence of acute fracture. Soft tissues and spinal canal: No prevertebral fluid or swelling. Please see concurrent CT of the neck for additional soft tissue findings in the neck. Disc levels: Moderate lower cervical degenerative change. Left lateral osteophyte at C5-C6. Upper chest: Lung apices are clear. IMPRESSION: No evidence of acute fracture or malalignment. Electronically Signed   By: Gilmore GORMAN Molt M.D.   On: 03/19/2024 22:31   CT Soft Tissue Neck W Contrast Result Date: 03/19/2024 CLINICAL DATA:  neck wound possibly infected left side EXAM: CT NECK WITH CONTRAST TECHNIQUE: Multidetector CT imaging of the neck was performed using the standard protocol following the bolus administration of intravenous contrast. RADIATION DOSE REDUCTION: This exam was performed according to the departmental dose-optimization program which includes automated exposure control, adjustment of the mA and/or kV according to patient size and/or  use of iterative reconstruction technique. CONTRAST:  75mL OMNIPAQUE  IOHEXOL  350 MG/ML SOLN COMPARISON:  None Available. FINDINGS: Pharynx and larynx: Normal. No mass or swelling. Salivary glands: No inflammation, mass, or stone. Thyroid : Normal. Lymph nodes: None enlarged or abnormal density. Vascular: Limited assessment due to non arterial timing. Bite lateral calcific atherosclerosis at the carotid bifurcations. Aortic atherosclerosis. Limited intracranial: See dedicated CT head. Visualized orbits: Negative. Mastoids and visualized paranasal sinuses: Clear. Skeleton: See dedicated CT of the cervical spine. Upper chest: Lung apices are clear. Other: Moderate edema in the left neck, tracking along the sternocleidomastoid and posterior paraspinal musculature. No discrete drainable fluid collection. IMPRESSION: Moderate edema in the left neck, tracking along the sternocleidomastoid and posterior paraspinal musculature. Findings are nonspecific but could be posttraumatic or infectious in etiology. No discrete drainable fluid collection. Electronically Signed   By: Gilmore GORMAN Molt M.D.   On: 03/19/2024 22:00   CT Head Wo Contrast Result Date: 03/19/2024 CLINICAL DATA:  Head trauma, moderate-severe EXAM: CT HEAD WITHOUT CONTRAST TECHNIQUE: Contiguous axial images were obtained from the base of the skull through the vertex without intravenous contrast. RADIATION DOSE REDUCTION: This exam was performed according to the departmental dose-optimization program which includes automated exposure control, adjustment of the mA and/or kV according to patient size and/or use of iterative reconstruction technique. COMPARISON:  CT head January 07, 2021. FINDINGS: Brain: No evidence of acute infarction, hemorrhage, hydrocephalus, extra-axial collection or mass lesion/mass effect. Vascular: No hyperdense vessel. Skull: No acute fracture. Sinuses/Orbits: Clear sinuses.  No acute orbital findings. Other: No mastoid effusions.  IMPRESSION: No evidence of acute intracranial abnormality. Electronically Signed   By: Gilmore GORMAN Molt M.D.   On: 03/19/2024 21:55    Pertinent labs & imaging results that were available during my care of the patient were reviewed by me and considered in my medical decision making (see MDM for details).  Medications Ordered in ED Medications  lactated ringers  infusion ( Intravenous New Bag/Given 03/20/24 0016)  lactated ringers  bolus 1,000 mL (0 mLs Intravenous Stopped 03/20/24 0016)  iohexol  (OMNIPAQUE ) 350 MG/ML injection 75 mL (75 mLs Intravenous Contrast Given  03/19/24 2145)  Ampicillin -Sulbactam (UNASYN ) 3 g in sodium chloride  0.9 % 100 mL IVPB (0 g Intravenous Stopped 03/19/24 2346)                                                                                                                                     Procedures Procedures  (including critical care time)  Medical Decision Making / ED Course   This patient presents to the ED for concern of fall, neck pain, swelling, this involves an extensive number of treatment options, and is a complaint that carries with it a high risk of complications and morbidity.  The differential diagnosis includes fracture, cellulitis, hematoma, infected hematoma, abscess  MDM: Patient seen emerged from for evaluation of a fall with neck pain and swelling.  Physical exam with skin sloughing, surrounding erythema, induration and what appears to be superficial purulence over the left lateral neck concerning for infected hematoma.  Laboratory evaluation with leukocytosis to 18.7, hemoglobin 11.7, potassium 3.2, BUN 28, creatinine 1.91 which is an elevation for this patient, AST 101.  Lactic acid 2.0.  Fluid resuscitation begun.  CT head and C-spine negative for acute traumatic injury.  Soft tissue neck CT with moderate edema in the left neck tracking along the SCM concerning for posttraumatic or infectious etiology.  No discrete drainable fluid collection.   Patient started on Unasyn  and patient will require hospital admission for suspected infected hematoma.   Additional history obtained: -Additional history obtained from daughter -External records from outside source obtained and reviewed including: Chart review including previous notes, labs, imaging, consultation notes   Lab Tests: -I ordered, reviewed, and interpreted labs.   The pertinent results include:   Labs Reviewed  COMPREHENSIVE METABOLIC PANEL WITH GFR - Abnormal; Notable for the following components:      Result Value   Sodium 134 (*)    Potassium 3.2 (*)    Chloride 97 (*)    Glucose, Bld 138 (*)    BUN 28 (*)    Creatinine, Ser 1.91 (*)    Albumin 3.4 (*)    AST 101 (*)    Total Bilirubin 1.8 (*)    GFR, Estimated 33 (*)    All other components within normal limits  CBC WITH DIFFERENTIAL/PLATELET - Abnormal; Notable for the following components:   WBC 18.7 (*)    RBC 3.28 (*)    Hemoglobin 11.7 (*)    HCT 34.1 (*)    MCV 104.0 (*)    MCH 35.7 (*)    Neutro Abs 17.4 (*)    Lymphs Abs 0.6 (*)    All other components within normal limits  URINALYSIS, W/ REFLEX TO CULTURE (INFECTION SUSPECTED) - Abnormal; Notable for the following components:   Color, Urine AMBER (*)    APPearance HAZY (*)    Hgb urine dipstick MODERATE (*)    Protein, ur 30 (*)    All other components  within normal limits  I-STAT CG4 LACTIC ACID, ED - Abnormal; Notable for the following components:   Lactic Acid, Venous 2.0 (*)    All other components within normal limits  I-STAT CG4 LACTIC ACID, ED      EKG   EKG Interpretation Date/Time:  Wednesday March 19 2024 17:32:51 EDT Ventricular Rate:  92 PR Interval:  158 QRS Duration:  88 QT Interval:  360 QTC Calculation: 445 R Axis:   85  Text Interpretation: Normal sinus rhythm Normal ECG When compared with ECG of 30-Jan-2009 05:18, PREVIOUS ECG IS PRESENT Confirmed by Roberto Hlavaty ([id number]) on 03/20/2024 2:05:29 AM          Imaging Studies ordered: I ordered imaging studies including CT head, C-spine, soft tissue neck I independently visualized and interpreted imaging. I agree with the radiologist interpretation   Medicines ordered and prescription drug management: Meds ordered this encounter  Medications   lactated ringers  bolus 1,000 mL   iohexol  (OMNIPAQUE ) 350 MG/ML injection 75 mL   Ampicillin -Sulbactam (UNASYN ) 3 g in sodium chloride  0.9 % 100 mL IVPB    Antibiotic Indication::   Wound Infection   lactated ringers  infusion    -I have reviewed the patients home medicines and have made adjustments as needed  Critical interventions none   Cardiac Monitoring: The patient was maintained on a cardiac monitor.  I personally viewed and interpreted the cardiac monitored which showed an underlying rhythm of: NSR  Social Determinants of Health:  Factors impacting patients care include: none   Reevaluation: After the interventions noted above, I reevaluated the patient and found that they have :stayed the same  Co morbidities that complicate the patient evaluation  Past Medical History:  Diagnosis Date   Choledocholithiasis 2009   sepsis s/p ICU stay with VDRF   Chronic fungal otitis externa 2015   Gaylen)   Colon polyp    adenomatous - Ganem   COPD (chronic obstructive pulmonary disease) (HCC)    DDD (degenerative disc disease)    cervical and lumbar spine   Depression with anxiety    ED (erectile dysfunction)    on viagra    Elevated PSA 2014   s/p normal biopsy 2003, now followed by Dr. Nieves, monitoring   History of alcohol abuse 1989   after bad divorce   History of smoking 2000   50+ PY hx, s/p normal CT chest 2014   HTN (hypertension)    OSA (obstructive sleep apnea)    could not tolerate CPAP   Peyronie disease    Presbycusis of both ears 2015   rec updated binaural amplification   Wears hearing aid    [employer]      Dispostion: I considered admission  for this patient, and patient will require hospital mission for suspected infected hematoma of the neck     Final Clinical Impression(s) / ED Diagnoses Final diagnoses:  Infected hematoma     @PCDICTATION @    Albertina Dixon, MD 03/20/24 [id number]

## 2024-03-19 NOTE — ED Triage Notes (Signed)
 PT arrives via POV after being seen in urgent care for a wound to left side of his neck.  Pt was also hypotensive at UC. PT reportedly hit the left side of his neck on a car door last week. PT has redness and drainage from the wound on left side of his neck. Pt also fell a couple of times today due to dizziness. Denies hitting his head, no loc, no blood thinners. Pt is AxOx4.

## 2024-03-19 NOTE — ED Provider Notes (Signed)
 [facility]   [id number] 03/19/24 Arrival Time: 1451  ASSESSMENT & PLAN:  1. Neck swelling    Initial BP of 85/59 worrisome. Recheck 106/66. Still lower than his baseline. Alert and oriented. Concern of neck injury/trauma that is swollen and looks/smells infected. Cannot r/o deeper infection or early sepsis.  To ED for further evaluation; by POV; stable upon discharge.  Reviewed expectations re: course of current medical issues. Questions answered. Outlined signs and symptoms indicating need for more acute intervention. Patient verbalized understanding. After Visit Summary given.   SUBJECTIVE:  [name] is a 89 y.o. male who presents with a possible infection of his LEFT neck; reports hitting neck on car door approx 1 w ago; since then has become swollen and bruised. Family member with him reports that he has fallen x 2 today but has been and remains alert and oriented. Denies fever. Denies swallowing difficulties  Called EMS earlier today but refused hospital transport.  OBJECTIVE:  Vitals:   03/19/24 1619 03/19/24 1643  BP: (!) 85/59 106/66  Pulse: 90   Resp: 18   Temp: 97.7 F (36.5 C)   TempSrc: Oral   SpO2: 94%      General appearance: alert; no distress HEENT: LEFT side of neck is swollen and bruised with underlying pale/yellowish discoloration; without active bleeding or open wounds; with associated foul odor; normal opening and closing of jaw Psychological: alert and cooperative; normal mood and affect  Allergies  Allergen Reactions   Sulfa Antibiotics Rash    Past Medical History:  Diagnosis Date   Choledocholithiasis 2009   sepsis s/p ICU stay with VDRF   Chronic fungal otitis externa 2015   Gaylen)   Colon polyp    adenomatous - Ganem   COPD (chronic obstructive pulmonary disease) (HCC)    DDD (degenerative disc disease)    cervical and lumbar spine   Depression with anxiety    ED (erectile dysfunction)    on viagra     Elevated PSA 2014   s/p normal biopsy 2003, now followed by Dr. Nieves, monitoring   History of alcohol abuse 1989   after bad divorce   History of smoking 2000   50+ PY hx, s/p normal CT chest 2014   HTN (hypertension)    OSA (obstructive sleep apnea)    could not tolerate CPAP   Peyronie disease    Presbycusis of both ears 2015   rec updated binaural amplification   Wears hearing aid    [facility] [employer]   Social History   Socioeconomic History   Marital status: Married    Spouse name: Not on file   Number of children: Not on file   Years of education: Not on file   Highest education level: Not on file  Occupational History   Not on file  Tobacco Use   Smoking status: Former    Current packs/day: 0.00    Average packs/day: 1 pack/day for 50.0 years (50.0 ttl pk-yrs)    Types: Cigarettes    Start date: 07/31/1948    Quit date: 07/31/1998    Years since quitting: 25.6   Smokeless tobacco: Never  Vaping Use   Vaping status: Never Used  Substance and Sexual Activity   Alcohol use: Yes    Alcohol/week: 6.0 - 7.0 standard drinks of alcohol    Types: 6 - 7 Glasses of wine per week   Drug use: No   Sexual activity: Yes  Other Topics Concern   Not on file  Social History Narrative   Lives with Rock wife (married since 2005)   Occupation: Medical sales representative, retired   Edu: 18+ yrs   Activity: walking regularly, but not as much as he would like to - wife has trouble keeping up.   Diet: good water, fruits/vegetables daily. Significant sweet tea.   Social Drivers of Corporate investment banker Strain: Low Risk  (05/28/2023)   Overall Financial Resource Strain (CARDIA)    Difficulty of Paying Living Expenses: Not hard at all  Food Insecurity: No Food Insecurity (05/28/2023)   Hunger Vital Sign    Worried About Running Out of Food in the Last Year: Never true    Ran Out of Food in the Last Year: Never true  Transportation Needs: No Transportation Needs (05/28/2023)   PRAPARE  - Administrator, Civil Service (Medical): No    Lack of Transportation (Non-Medical): No  Physical Activity: Sufficiently Active (05/28/2023)   Exercise Vital Sign    Days of Exercise per Week: 5 days    Minutes of Exercise per Session: 30 min  Stress: No Stress Concern Present (05/28/2023)   [employer] of Occupational Health - Occupational Stress Questionnaire    Feeling of Stress : Not at all  Social Connections: Moderately Isolated (05/28/2023)   Social Connection and Isolation Panel    Frequency of Communication with Friends and Family: More than three times a week    Frequency of Social Gatherings with Friends and Family: More than three times a week    Attends Religious Services: Never    Database administrator or Organizations: No    Attends Banker Meetings: Never    Marital Status: Married   Family History  Problem Relation Age of Onset   Cancer Father        throat?   Alcohol abuse Father    Stroke Maternal Aunt        hemorrhagic   Stroke Mother        ischemic   Diabetes Neg Hx    Past Surgical History:  Procedure Laterality Date   CARDIAC CATHETERIZATION  2009   WNL per records, [place]   CHOLECYSTECTOMY  2009   COLONOSCOPY  2009   adenomatous polyp, rec rpt 5 yrs Deidra)   COLONOSCOPY  07/2013   no polyps Deidra)   ECTROPION REPAIR Bilateral 04/2018   bilat lower eyelids   hospitalization  2010   ERCP with sphincertotomy and CBD stone removal - septic cholangiis with EtOH withdrawal, E coli bacteremia with septic shock, with arterial thromboembolization with discoloration of toes of R foot   rectal fistula repair  1984   REFRACTIVE SURGERY Right 03/2019   TONSILLECTOMY  1941   VASECTOMY  1969            Rolinda Rogue, MD 03/19/24 1649

## 2024-03-19 NOTE — Telephone Encounter (Signed)
 Reviewed patient chart in UC for evaluation now.

## 2024-03-19 NOTE — ED Notes (Signed)
 Patient is being discharged from the Urgent Care and sent to the Emergency Department via POV . Per Dr. Rolinda, patient is in need of higher level of care due to fall and possible neck infection. Patient is aware and verbalizes understanding of plan of care.  Vitals:   03/19/24 1619 03/19/24 1643  BP: (!) 85/59 106/66  Pulse: 90   Resp: 18   Temp: 97.7 F (36.5 C)   SpO2: 94%

## 2024-03-19 NOTE — Telephone Encounter (Signed)
 FYI Only or Action Required?: FYI only for provider.  Patient was last seen in primary care on 06/18/2023 by Kevin Baller, MD.  Called Nurse Triage reporting Fall.  Symptoms began several days ago.  Interventions attempted: Nothing.  Symptoms are: rapidly worsening.  Triage Disposition: Go to ED or PCP/Alternative with Approval  Patient/caregiver understands and will follow disposition?: No, refuses disposition  Copied from CRM [id number]. Topic: Clinical - Red Word Triage >> Mar 19, 2024 10:52 AM [name] wrote: Red Word that prompted transfer to Nurse Triage: Patient wife [name] called in on behalf of the patient. The Patient fell out the bed today at 3am and refused to go to the hospital, he has a bad infection on his neck, and he is not doing good at all. Reason for Disposition  Patient sounds very sick or weak to the triager  Answer Assessment - Initial Assessment Questions 1. MECHANISM: How did the fall happen?     Clemens out of bed  2. DOMESTIC VIOLENCE AND ELDER ABUSE SCREENING: Did you fall because someone pushed you or tried to hurt you? If Yes, ask: Are you safe now?     no 3. ONSET: When did the fall happen? (e.g., minutes, hours, or days ago)     Early morning around 3 am  4. LOCATION: What part of the body hit the ground? (e.g., back, buttocks, head, hips, knees, hands, head, stomach)     Arms and legs  5. INJURY: Did you hurt (injure) yourself when you fell? If Yes, ask: What did you injure? Tell me more about this? (e.g., body area; type of injury; pain severity)     Not badly  6. PAIN: Is there any pain? If Yes, ask: How bad is the pain? (e.g., Scale 0-10; or none, mild,      Mild pain has gone back to sleep since thing  7. SIZE: For cuts, bruises, or swelling, ask: How large is it? (e.g., inches or centimeters)      Has large bruise on neck from unrelated injury  8. PREGNANCY: Is there any chance you are pregnant? When was  your last menstrual period?     No  9. OTHER SYMPTOMS: Do you have any other symptoms? (e.g., dizziness, fever, weakness; new-onset or worsening).      Shaking, weakness, and poor appetite  10. CAUSE: What do you think caused the fall (or falling)? (e.g., dizzy spell, tripped)       Thinks it could be related to wound infection  Fyi- Patient has wound on neck rom hitting himself on car door at grocery store last week, has been cleaning saline water but now it looks infected, draining pus.  Answer Assessment - Initial Assessment Questions 1. LOCATION: Where is the wound located?      On neck  2. WOUND APPEARANCE: What does the wound look like?      Looks infected, red, and draining pus  3. SIZE: If redness is present, ask: What is the size of the red area? (Inches, centimeters, or compare to size of a coin)      Very big  4. SPREAD: What's changed in the last day?  Do you see any red streaks coming from the wound?   Redness noted  5. ONSET: When did it start to look infected?      Last week  6. MECHANISM: How did the wound start, what was the cause?     Scraped neck on car door last week  at grocery week  7. PAIN: Is there any pain? If Yes, ask: How bad is the pain?   (Scale 1-10; or mild, moderate, severe)     Mild to moderate  8. FEVER: Do you have a fever? If Yes, ask: What is your temperature, how was it measured, and when did it start?     Unsure of fever  9. OTHER SYMPTOMS: Do you have any other symptoms? (e.g., shaking chills, weakness, rash elsewhere on body)     Shaking, chills, weakness, poor appetite, and falls  Protocols used: Falls and Falling-A-AH, Wound Infection-A-AH

## 2024-03-19 NOTE — ED Triage Notes (Signed)
 Pt has been falling a lot over the past week. Pt reports that he has sciatica that has moved from left leg to right leg. Pt has skin tear to RLE and LUE.  Pt called EMS earlier today but didn't wont to go with them to be seen at ED.  Pt has area on left neck that wanted to be evaluated for possible infection. Pt reports hit the left side of neck about week ago with car door.

## 2024-03-19 NOTE — ED Provider Triage Note (Signed)
 Emergency Medicine Provider Triage Evaluation Note  [name] , a 89 y.o. male  was evaluated in triage.  Pt complains of left lateral neck pain and frequent falls.  Patient states that about a week ago he opened the car door and hit his neck. Also had 2 falls today.   Review of Systems  Positive:  Negative:   Physical Exam  BP (!) 110/42   Pulse 82   Temp 98 F (36.7 C)   Resp 18   Ht 5' 6 (1.676 m)   Wt 67.1 kg   SpO2 97%   BMI 23.89 kg/m  Gen:   Awake, no distress   Resp:  Normal effort  MSK:   Moves extremities without difficulty  Other:    Medical Decision Making  Medically screening exam initiated at 5:34 PM.  Appropriate orders placed.  [name] was informed that the remainder of the evaluation will be completed by another provider, this initial triage assessment does not replace that evaluation, and the importance of remaining in the ED until their evaluation is complete.     [name], [place] 03/19/24 [id number]

## 2024-03-20 ENCOUNTER — Encounter (HOSPITAL_COMMUNITY): Payer: Self-pay | Admitting: Internal Medicine

## 2024-03-20 DIAGNOSIS — E871 Hypo-osmolality and hyponatremia: Secondary | ICD-10-CM | POA: Insufficient documentation

## 2024-03-20 DIAGNOSIS — E876 Hypokalemia: Secondary | ICD-10-CM | POA: Diagnosis not present

## 2024-03-20 DIAGNOSIS — N179 Acute kidney failure, unspecified: Secondary | ICD-10-CM | POA: Diagnosis not present

## 2024-03-20 DIAGNOSIS — L03221 Cellulitis of neck: Secondary | ICD-10-CM | POA: Diagnosis not present

## 2024-03-20 LAB — CBC WITH DIFFERENTIAL/PLATELET
Basophils Absolute: 0 K/uL (ref 0.0–0.1)
Basophils Relative: 0 %
Eosinophils Absolute: 0 K/uL (ref 0.0–0.5)
Eosinophils Relative: 0 %
HCT: 29.3 % — ABNORMAL LOW (ref 39.0–52.0)
Hemoglobin: 10 g/dL — ABNORMAL LOW (ref 13.0–17.0)
Lymphocytes Relative: 2 %
Lymphs Abs: 0.3 K/uL — ABNORMAL LOW (ref 0.7–4.0)
MCH: 35 pg — ABNORMAL HIGH (ref 26.0–34.0)
MCHC: 34.1 g/dL (ref 30.0–36.0)
MCV: 102.4 fL — ABNORMAL HIGH (ref 80.0–100.0)
Monocytes Absolute: 0.3 K/uL (ref 0.1–1.0)
Monocytes Relative: 2 %
Neutro Abs: 12.9 K/uL — ABNORMAL HIGH (ref 1.7–7.7)
Neutrophils Relative %: 96 %
Platelets: 156 K/uL (ref 150–400)
RBC: 2.86 MIL/uL — ABNORMAL LOW (ref 4.22–5.81)
RDW: 13.4 % (ref 11.5–15.5)
WBC: 13.4 K/uL — ABNORMAL HIGH (ref 4.0–10.5)
nRBC: 0 % (ref 0.0–0.2)

## 2024-03-20 LAB — IRON AND TIBC
Iron: 7 ug/dL — ABNORMAL LOW (ref 45–182)
Saturation Ratios: 4 % — ABNORMAL LOW (ref 17.9–39.5)
TIBC: 195 ug/dL — ABNORMAL LOW (ref 250–450)
UIBC: 188 ug/dL

## 2024-03-20 LAB — FOLATE: Folate: 10.6 ng/mL (ref >5.9)

## 2024-03-20 LAB — BASIC METABOLIC PANEL WITH GFR
Anion gap: 18 — ABNORMAL HIGH (ref 5–15)
BUN: 31 mg/dL — ABNORMAL HIGH (ref 8–23)
CO2: 19 mmol/L — ABNORMAL LOW (ref 22–32)
Calcium: 9.2 mg/dL (ref 8.9–10.3)
Chloride: 100 mmol/L (ref 98–111)
Creatinine, Ser: 1.58 mg/dL — ABNORMAL HIGH (ref 0.61–1.24)
GFR, Estimated: 42 mL/min — ABNORMAL LOW (ref >60)
Glucose, Bld: 105 mg/dL — ABNORMAL HIGH (ref 70–99)
Potassium: 3 mmol/L — ABNORMAL LOW (ref 3.5–5.1)
Sodium: 137 mmol/L (ref 135–145)

## 2024-03-20 LAB — RETICULOCYTES
Immature Retic Fract: 15.5 % (ref 2.3–15.9)
RBC.: 2.86 MIL/uL — ABNORMAL LOW (ref 4.22–5.81)
Retic Count, Absolute: 38.6 K/uL (ref 19.0–186.0)
Retic Ct Pct: 1.4 % (ref 0.4–3.1)

## 2024-03-20 LAB — HEPATIC FUNCTION PANEL
ALT: 29 U/L (ref 0–44)
AST: 83 U/L — ABNORMAL HIGH (ref 15–41)
Albumin: 2.7 g/dL — ABNORMAL LOW (ref 3.5–5.0)
Alkaline Phosphatase: 58 U/L (ref 38–126)
Bilirubin, Direct: 0.3 mg/dL — ABNORMAL HIGH (ref 0.0–0.2)
Indirect Bilirubin: 1.1 mg/dL — ABNORMAL HIGH (ref 0.3–0.9)
Total Bilirubin: 1.4 mg/dL — ABNORMAL HIGH (ref 0.0–1.2)
Total Protein: 5.5 g/dL — ABNORMAL LOW (ref 6.5–8.1)

## 2024-03-20 LAB — VITAMIN B12: Vitamin B-12: 897 pg/mL (ref 180–914)

## 2024-03-20 LAB — FERRITIN: Ferritin: 287 ng/mL (ref 24–336)

## 2024-03-20 LAB — LACTIC ACID, PLASMA: Lactic Acid, Venous: 1 mmol/L (ref 0.5–1.9)

## 2024-03-20 MED ORDER — POTASSIUM CHLORIDE CRYS ER 10 MEQ PO TBCR
30.0000 meq | EXTENDED_RELEASE_TABLET | Freq: Once | ORAL | Status: AC
  Administered 2024-03-20: 30 meq via ORAL
  Filled 2024-03-20: qty 1

## 2024-03-20 MED ORDER — SODIUM CHLORIDE 0.9 % IV SOLN
2.0000 g | INTRAVENOUS | Status: DC
  Administered 2024-03-20: 2 g via INTRAVENOUS
  Filled 2024-03-20: qty 20

## 2024-03-20 MED ORDER — FOLIC ACID 1 MG PO TABS
1.0000 mg | ORAL_TABLET | Freq: Every day | ORAL | Status: DC
  Administered 2024-03-20 – 2024-03-23 (×4): 1 mg via ORAL
  Filled 2024-03-20 (×4): qty 1

## 2024-03-20 MED ORDER — VANCOMYCIN HCL 1250 MG/250ML IV SOLN: 1250.0000 mg | INTRAVENOUS | Status: DC

## 2024-03-20 MED ORDER — ACETAMINOPHEN 325 MG PO TABS
650.0000 mg | ORAL_TABLET | Freq: Four times a day (QID) | ORAL | Status: DC | PRN
  Administered 2024-03-22 – 2024-03-23 (×3): 650 mg via ORAL
  Filled 2024-03-20 (×3): qty 2

## 2024-03-20 MED ORDER — MELATONIN 3 MG PO TABS
3.0000 mg | ORAL_TABLET | Freq: Every day | ORAL | Status: DC
  Administered 2024-03-20 – 2024-03-22 (×3): 3 mg via ORAL
  Filled 2024-03-20 (×4): qty 1

## 2024-03-20 MED ORDER — ACETAMINOPHEN 650 MG RE SUPP: 650.0000 mg | Freq: Four times a day (QID) | RECTAL | Status: DC | PRN

## 2024-03-20 MED ORDER — HEPARIN SODIUM (PORCINE) 5000 UNIT/ML IJ SOLN
5000.0000 [IU] | Freq: Three times a day (TID) | INTRAMUSCULAR | Status: DC
  Administered 2024-03-20 – 2024-03-23 (×10): 5000 [IU] via SUBCUTANEOUS
  Filled 2024-03-20 (×10): qty 1

## 2024-03-20 MED ORDER — SODIUM CHLORIDE 0.9 % IV SOLN
2.0000 g | INTRAVENOUS | Status: DC
  Administered 2024-03-20: 2 g via INTRAVENOUS
  Filled 2024-03-20: qty 12.5

## 2024-03-20 MED ORDER — B COMPLEX-C PO TABS
1.0000 | ORAL_TABLET | Freq: Every day | ORAL | Status: DC
  Administered 2024-03-20 – 2024-03-23 (×4): 1 via ORAL
  Filled 2024-03-20 (×4): qty 1

## 2024-03-20 MED ORDER — VANCOMYCIN HCL 1500 MG/300ML IV SOLN
1500.0000 mg | Freq: Once | INTRAVENOUS | Status: AC
  Administered 2024-03-20: 1500 mg via INTRAVENOUS
  Filled 2024-03-20 (×2): qty 300

## 2024-03-20 MED ORDER — METOPROLOL TARTRATE 25 MG PO TABS
25.0000 mg | ORAL_TABLET | Freq: Every day | ORAL | Status: DC
  Filled 2024-03-20: qty 1

## 2024-03-20 NOTE — Plan of Care (Signed)
  Problem: Clinical Measurements: Goal: Diagnostic test results will improve Outcome: Not Progressing Goal: Respiratory complications will improve Outcome: Progressing   Problem: Activity: Goal: Risk for activity intolerance will decrease Outcome: Progressing   Problem: Nutrition: Goal: Adequate nutrition will be maintained Outcome: Not Progressing   Problem: Pain Managment: Goal: General experience of comfort will improve and/or be controlled Outcome: Progressing

## 2024-03-20 NOTE — Plan of Care (Signed)

## 2024-03-20 NOTE — Progress Notes (Signed)
 Pt was admitted to unit via stretcher from ED.  Staff transferred pt to the bed via a slide method  Pt denies pain except for if any type of pressure is applied to the left side of his neck. LR infusing at 118mL/hr. Tele in place. Pt was alert and oriented. Hearing aids at bedside. Wound documentation was completed and foam dressings are in place where appropriate.  Prophylactic sacrum dressing is in place.  The skin under the sacral dressing is intact and blanchable.  Pt given urinal and states that he is able to use it without assistance. Bed alarm is on since pt is a high falls risk, floor mats are in place.

## 2024-03-20 NOTE — ED Notes (Signed)
Transporter called to transport pt to floor. 

## 2024-03-20 NOTE — Consult Note (Signed)
 WOC Nurse Consult Note: Reason for Consult:  Neck wound.  Seen in UC yesterday; reports hitting neck on car door approx 1 w ago; since then has become swollen and bruised.   Patient's family also reports falls at home x 2; incidentally noted in review of neck wound photos that patient also has skin tears on his right pretibial area and left forearm   Wound type: trauma Pressure Injury POA: NA Measurement: see nursing flow shets Wound azi:ozqu area on neck just below the ear is yellow in color, appears to have some drainage  Drainage (amount, consistency, odor) see nursing flow sheets Periwound: edema, bruising  Dressing procedure/placement/frequency: Cleanse neck, right pretibial, and left forearm wound with saline, pat dry Cover any weeping areas with single layer of xeroform gauze top with foam. Change every 3 days     Re consult if needed, will not follow at this time. Thanks  Sadler [employer]ner [employer], RN,CWOCN, CNS, [employer] [phone]

## 2024-03-20 NOTE — Progress Notes (Signed)
 Pharmacy Antibiotic Note  [name] is a 89 y.o. male admitted on 03/19/2024 with cellulitis/wound infection.  Pharmacy has been consulted for Vancomycin  dosing. WBC is elevated. Noted renal dysfunction.   Plan: Vancomycin  1250 mg IV q48h >>>Estimated AUC: 528 Cefepime  2g IV 24h Trend WBC, temp, renal function  F/U infectious work-up Drug levels as indicated   Height: 5' 6 (167.6 cm) Weight: 67.1 kg (148 lb) IBW/kg (Calculated) : 63.8  Temp (24hrs), Avg:98.3 F (36.8 C), Min:97.7 F (36.5 C), Max:99.4 F (37.4 C)  Recent Labs  Lab 03/19/24 1720 03/19/24 1731  WBC 18.7*  --   CREATININE 1.91*  --   LATICACIDVEN  --  2.0*    Estimated Creatinine Clearance: 24.1 mL/min (A) (by C-G formula based on SCr of 1.91 mg/dL (H)).    Allergies  Allergen Reactions   Sulfa Antibiotics Rash   Lynwood Mckusick, PharmD, BCPS Clinical Pharmacist Phone: [phone]

## 2024-03-20 NOTE — Hospital Course (Addendum)
 Patient with PMH of HTN, sciatica, COPD, depression, OSA presents to the hospital with complaints of neck wound. Currently being treated for cellulitis. Assessment and Plan: Cellulitis of the left side of the neck. Myositis involving sternocleidomastoid as well as paraspinal muscles. Presents with a trauma to his neck.  Later on he developed swelling and redness of that area with ongoing pain. CT scan shows evidence of myositis as well as cellulitis. No evidence of drainable pus. Treated with IV antibiotics. Will continue to follow-up on blood cultures. Discussed with ENT, outpatient follow-up recommended.  Sciatica. Follows up with Dr. Josefina. Scheduled to see them next week. Monitor for now. PT OT consulted.  Presented with 3 falls.  HTN. Blood pressure is soft. Holding medication.  AKI. Hyponatremia. Hypokalemia. Electrolytes are being replaced. Renal function improving with IV hydration. Holding home diuretics.  Related to folic acid  deficiency Iron deficiency anemia with macrocytosis. Outpatient follow-up with PCP for repeat iron levels. Initiating supplementation. Monitor.  Hyperbilirubinemia. Etiology is not clear. Improving with IV hydration. For now we will monitor.  Anion gap metabolic acidosis. Anion gap is 18. Suspect in the setting of AKI. Lactic acid is normal. For now we will monitor with IV hydration.  PT OT consulted. Patient may have difficulty with dressing changes since he lives alone and his neck wound is not accessible that easily. Concerned that poor wound management is the reason why he is here with cellulitis and myositis. Will monitor for improvement.  Not safe to go home without dressing change arrangements.

## 2024-03-20 NOTE — Progress Notes (Signed)
 [facility] Progress Note Patient: [name] FMW:[id number] DOB: 07-10-1933 DOA: 03/19/2024  DOS: the patient was seen and examined on 03/20/2024  Brief Hospital Course: Patient with PMH of HTN, sciatica, COPD, depression, OSA presents to the hospital with complaints of neck wound. Currently being treated for cellulitis. Assessment and Plan: Cellulitis of the left side of the neck. Myositis involving sternocleidomastoid as well as paraspinal muscles. Presents with a trauma to his neck.  Later on he developed swelling and redness of that area with ongoing pain. CT scan shows evidence of myositis as well as cellulitis. No evidence of drainable pus. Treated with IV antibiotics. Will continue to follow-up on blood cultures. Discussed with ENT, outpatient follow-up recommended.  Sciatica. Follows up with Dr. Josefina. Scheduled to see them next week. Monitor for now. PT OT consulted.  HTN. Blood pressure is soft. Holding medication.  AKI. Hyponatremia. Hypokalemia. Electrolytes are being replaced. Renal function improving with IV hydration. Holding home diuretics.  Related to folic acid  deficiency Iron deficiency anemia with macrocytosis. Outpatient follow-up with PCP for repeat iron levels. Initiating supplementation. Monitor.  Hyperbilirubinemia. Etiology is not clear. Improving with IV hydration. For now we will monitor.  Anion gap metabolic acidosis. Anion gap is 18. Suspect in the setting of AKI. Lactic acid is normal. For now we will monitor with IV hydration.   Subjective: No nausea no vomiting no fever no chills but no chest pain.  Improving pain  Physical Exam: Clear to auscultation. S1-S2 present. No focal deficit. Alert awake and oriented x 3. Significant erythema of his left neck with sloughing skin.   Data Reviewed: I have Reviewed nursing notes, Vitals, and Lab results. Since last encounter, pertinent lab results CBC and BMP   . I have  ordered test including CBC and BMP  . I have discussed pt's care plan and test results with ENT  .   Disposition: Status is: Inpatient Remains inpatient appropriate because: Monitor for improvement.  Cellulitis.  Culture clearance  heparin  injection 5,000 Units Start: 03/20/24 0600   Family Communication: Family at bedside Level of care: Telemetry Medical   Vitals:   03/20/24 0103 03/20/24 0528 03/20/24 0954 03/20/24 1707  BP: (!) 146/60 (!) 115/41 (!) 107/56 (!) 122/55  Pulse: 96 87 82 77  Resp: 16 16  16   Temp: 98 F (36.7 C) 98 F (36.7 C) 98 F (36.7 C) (!) 97.4 F (36.3 C)  TempSrc:   Oral Oral  SpO2: 98% 95% 98% 100%  Weight:      Height:         Author: Yetta Blanch, MD 03/20/2024 7:24 PM  Please look on [email or web address] to find out who is on call.

## 2024-03-20 NOTE — H&P (Signed)
 History and Physical    [name] FMW:[id number] DOB: 08-28-1932 DOA: 03/19/2024  Patient coming from: Home.  Chief Complaint: Neck wound.  HPI: [name] is a 89 y.o. male with history of hypertension, macrocytic anemia, sciatica gets epidural injections presents to the ER after patient was found to be hypotensive at urgent care.  Patient states he hurt his neck while getting out of his car about 10 days ago.  Since then the left side of his neck's wound has been gradually worsening with increasing discharge.  He went to the urgent care and was found to be hypotensive was referred to the ER.  Has increasing pain around the neck.  Denies any fever or chills.  ED Course: In the ER patient was tachycardic with elevated lactic acid temperature of 99.4 F CT of the neck shows fluid collection concerning for either trauma or infectious etiology.  No definite drainable abscess seen.  Cultures obtained started on empiric antibiotics admitted for possible developing sepsis from his left neck cellulitis.  Review of Systems: As per HPI, rest all negative.   Past Medical History:  Diagnosis Date   Choledocholithiasis 2009   sepsis s/p ICU stay with VDRF   Chronic fungal otitis externa 2015   [facility])   Colon polyp    adenomatous - [facility]   COPD (chronic obstructive pulmonary disease) (HCC)    DDD (degenerative disc disease)    cervical and lumbar spine   Depression with anxiety    ED (erectile dysfunction)    on viagra    Elevated PSA 2014   s/p normal biopsy 2003, now followed by Dr. Nieves, monitoring   History of alcohol abuse 1989   after bad divorce   History of smoking 2000   50+ PY hx, s/p normal CT chest 2014   HTN (hypertension)    OSA (obstructive sleep apnea)    could not tolerate CPAP   Peyronie disease    Presbycusis of both ears 2015   rec updated binaural amplification   Wears hearing aid    [employer]    Past Surgical History:  Procedure Laterality  Date   CARDIAC CATHETERIZATION  2009   WNL per records, [place]   CHOLECYSTECTOMY  2009   COLONOSCOPY  2009   adenomatous polyp, rec rpt 5 yrs ([facility])   COLONOSCOPY  07/2013   no polyps ([facility])   ECTROPION REPAIR Bilateral 04/2018   bilat lower eyelids   hospitalization  2010   ERCP with sphincertotomy and CBD stone removal - septic cholangiis with EtOH withdrawal, E coli bacteremia with septic shock, with arterial thromboembolization with discoloration of toes of R foot   rectal fistula repair  1984   REFRACTIVE SURGERY Right 03/2019   TONSILLECTOMY  1941   VASECTOMY  1969     reports that he quit smoking about 25 years ago. His smoking use included cigarettes. He started smoking about 75 years ago. He has a 50 pack-year smoking history. He has never used smokeless tobacco. He reports current alcohol use of about 6.0 - 7.0 standard drinks of alcohol per week. He reports that he does not use drugs.  Allergies  Allergen Reactions   Sulfa Antibiotics Rash    Family History  Problem Relation Age of Onset   Cancer Father        throat?   Alcohol abuse Father    Stroke Maternal Aunt        hemorrhagic   Stroke Mother  ischemic   Diabetes Neg Hx     Prior to Admission medications   Medication Sig Start Date End Date Taking? Authorizing Provider  Coenzyme Q10 (COQ-10) 50 MG CAPS Take by mouth daily.   Yes [provider]  Ginkgo Biloba 40 MG TABS Take 2 tablets (80 mg total) by mouth daily. 04/08/19  Yes [name], MD  ibuprofen (ADVIL) 200 MG tablet Take 200 mg by mouth every 6 (six) hours as needed for mild pain (pain score 1-3) (Leg pain).   Yes [provider]  melatonin 3 MG TABS tablet Take 3 mg by mouth at bedtime.   Yes [provider]  metoprolol  tartrate (LOPRESSOR ) 25 MG tablet Take 0.5 tablets (12.5 mg total) by mouth 2 (two) times daily. Patient taking differently: Take 25 mg by mouth daily. 06/18/23  Yes [name], MD   Multiple Vitamin (MULTIVITAMIN) tablet Take 1 tablet by mouth daily.   Yes [provider]  Multiple Vitamins-Minerals (PRESERVISION AREDS 2) CAPS Take 1 capsule by mouth daily. 06/18/23  Yes [name], MD  Potassium 99 MG TABS Take 1 tablet (99 mg total) by mouth every Monday, Wednesday, and Friday. 06/18/23  Yes [name], MD  triamterene -hydrochlorothiazide (MAXZIDE-25) 37.5-25 MG tablet Take 1 tablet by mouth every other day. 06/18/23  Yes [name], MD  Zn-Pyg Afri-Nettle-Saw Palmet (SAW PALMETTO COMPLEX PO) Take 250 mg by mouth every other day.    Yes [provider]    Physical Exam: Constitutional: Moderately built and nourished. Vitals:   03/19/24 2300 03/19/24 2330 03/19/24 2345 03/20/24 0103  BP: (!) 131/57 125/69 127/74 (!) 146/60  Pulse: 89  96 96  Resp:    16  Temp:    98 F (36.7 C)  TempSrc:      SpO2: 98%  96% 98%  Weight:      Height:       Eyes: Anicteric no pallor. ENMT: No discharge from the ears eyes nose or mouth. Neck: No mass felt.  No neck rigidity.  Excoriated skin on the left side. Respiratory: No rhonchi or crepitations. Cardiovascular: S1-S2 heard. Abdomen: Soft nontender bowel sound present. Musculoskeletal: No edema. Skin: Skin has left-sided skin excoriations with some discharge. Neurologic: Alert awake oriented to time place and person.  Moves all extremities. Psychiatric: Appears normal.  Normal affect.   Labs on Admission: I have personally reviewed following labs and imaging studies  CBC: Recent Labs  Lab 03/19/24 1720  WBC 18.7*  NEUTROABS 17.4*  HGB 11.7*  HCT 34.1*  MCV 104.0*  PLT 185   Basic Metabolic Panel: Recent Labs  Lab 03/19/24 1720  NA 134*  K 3.2*  CL 97*  CO2 23  GLUCOSE 138*  BUN 28*  CREATININE 1.91*  CALCIUM 9.6   GFR: Estimated Creatinine Clearance: 24.1 mL/min (A) (by C-G formula based on SCr of 1.91 mg/dL (H)). Liver Function Tests: Recent Labs  Lab  03/19/24 1720  AST 101*  ALT 34  ALKPHOS 59  BILITOT 1.8*  PROT 6.6  ALBUMIN 3.4*   No results for input(s): LIPASE, AMYLASE in the last 168 hours. No results for input(s): AMMONIA in the last 168 hours. Coagulation Profile: No results for input(s): INR, PROTIME in the last 168 hours. Cardiac Enzymes: No results for input(s): CKTOTAL, CKMB, CKMBINDEX, TROPONINI in the last 168 hours. BNP (last 3 results) No results for input(s): PROBNP in the last 8760 hours. HbA1C: No results for input(s): HGBA1C in the last 72 hours. CBG:  No results for input(s): GLUCAP in the last 168 hours. Lipid Profile: No results for input(s): CHOL, HDL, LDLCALC, TRIG, CHOLHDL, LDLDIRECT in the last 72 hours. Thyroid  Function Tests: No results for input(s): TSH, T4TOTAL, FREET4, T3FREE, THYROIDAB in the last 72 hours. Anemia Panel: No results for input(s): VITAMINB12, FOLATE, FERRITIN, TIBC, IRON, RETICCTPCT in the last 72 hours. Urine analysis:    Component Value Date/Time   COLORURINE AMBER (A) 03/19/2024 1720   APPEARANCEUR HAZY (A) 03/19/2024 1720   LABSPEC 1.026 03/19/2024 1720   PHURINE 5.0 03/19/2024 1720   GLUCOSEU NEGATIVE 03/19/2024 1720   HGBUR MODERATE (A) 03/19/2024 1720   BILIRUBINUR NEGATIVE 03/19/2024 1720   BILIRUBINUR Negative 02/26/2014 1630   KETONESUR NEGATIVE 03/19/2024 1720   PROTEINUR 30 (A) 03/19/2024 1720   UROBILINOGEN 0.2 02/26/2014 1630   UROBILINOGEN 1.0 01/17/2009 1638   NITRITE NEGATIVE 03/19/2024 1720   LEUKOCYTESUR NEGATIVE 03/19/2024 1720   Sepsis Labs: @LABRCNTIP (procalcitonin:4,lacticidven:4) )No results found for this or any previous visit (from the past 240 hours).   Radiological Exams on Admission: CT C-SPINE NO CHARGE Result Date: 03/19/2024 CLINICAL DATA:  Left lateral neck pain after frequent falls. EXAM: CT CERVICAL SPINE WITHOUT CONTRAST TECHNIQUE: Multidetector CT imaging of the cervical  spine was performed without intravenous contrast. Multiplanar CT image reconstructions were also generated. RADIATION DOSE REDUCTION: This exam was performed according to the departmental dose-optimization program which includes automated exposure control, adjustment of the mA and/or kV according to patient size and/or use of iterative reconstruction technique. COMPARISON:  None Available. FINDINGS: Alignment: No substantial sagittal subluxation. Skull base and vertebrae: No evidence of acute fracture. Soft tissues and spinal canal: No prevertebral fluid or swelling. Please see concurrent CT of the neck for additional soft tissue findings in the neck. Disc levels: Moderate lower cervical degenerative change. Left lateral osteophyte at C5-C6. Upper chest: Lung apices are clear. IMPRESSION: No evidence of acute fracture or malalignment. Electronically Signed   By: Gilmore GORMAN Molt M.D.   On: 03/19/2024 22:31   CT Soft Tissue Neck W Contrast Result Date: 03/19/2024 CLINICAL DATA:  neck wound possibly infected left side EXAM: CT NECK WITH CONTRAST TECHNIQUE: Multidetector CT imaging of the neck was performed using the standard protocol following the bolus administration of intravenous contrast. RADIATION DOSE REDUCTION: This exam was performed according to the departmental dose-optimization program which includes automated exposure control, adjustment of the mA and/or kV according to patient size and/or use of iterative reconstruction technique. CONTRAST:  75mL OMNIPAQUE  IOHEXOL  350 MG/ML SOLN COMPARISON:  None Available. FINDINGS: Pharynx and larynx: Normal. No mass or swelling. Salivary glands: No inflammation, mass, or stone. Thyroid : Normal. Lymph nodes: None enlarged or abnormal density. Vascular: Limited assessment due to non arterial timing. Bite lateral calcific atherosclerosis at the carotid bifurcations. Aortic atherosclerosis. Limited intracranial: See dedicated CT head. Visualized orbits: Negative.  Mastoids and visualized paranasal sinuses: Clear. Skeleton: See dedicated CT of the cervical spine. Upper chest: Lung apices are clear. Other: Moderate edema in the left neck, tracking along the sternocleidomastoid and posterior paraspinal musculature. No discrete drainable fluid collection. IMPRESSION: Moderate edema in the left neck, tracking along the sternocleidomastoid and posterior paraspinal musculature. Findings are nonspecific but could be posttraumatic or infectious in etiology. No discrete drainable fluid collection. Electronically Signed   By: Gilmore GORMAN Molt M.D.   On: 03/19/2024 22:00   CT Head Wo Contrast Result Date: 03/19/2024 CLINICAL DATA:  Head trauma, moderate-severe EXAM: CT HEAD WITHOUT CONTRAST TECHNIQUE: Contiguous axial images were obtained from  the base of the skull through the vertex without intravenous contrast. RADIATION DOSE REDUCTION: This exam was performed according to the departmental dose-optimization program which includes automated exposure control, adjustment of the mA and/or kV according to patient size and/or use of iterative reconstruction technique. COMPARISON:  CT head January 07, 2021. FINDINGS: Brain: No evidence of acute infarction, hemorrhage, hydrocephalus, extra-axial collection or mass lesion/mass effect. Vascular: No hyperdense vessel. Skull: No acute fracture. Sinuses/Orbits: Clear sinuses.  No acute orbital findings. Other: No mastoid effusions. IMPRESSION: No evidence of acute intracranial abnormality. Electronically Signed   By: Gilmore GORMAN Molt M.D.   On: 03/19/2024 21:55    EKG: Independently reviewed.  Sinus rhythm.  Assessment/Plan Principal Problem:   Cellulitis Active Problems:   HTN (hypertension)   COPD (chronic obstructive pulmonary disease) (HCC)   Macrocytic anemia   ARF (acute renal failure) (HCC)   Hyponatremia   Hypokalemia    Cellulitis of the left side of the neck with concerning features for developing sepsis on empiric  antibiotics follow cultures.  Continue hydration.  Wound team consult.  No drainable abscess seen on the CAT scan. Acute renal failure with hyponatremia and hypokalemia likely from dehydration.  Hold diuretics.  Continue hydration replace potassium and follow metabolic panel. Hypertension hold diuretics.  Continue metoprolol .  Follow blood pressure trends. Macrocytic anemia appears to be chronic.  Check anemia panel. COPD mentioned in the chart presently not on medications.  Is not wheezing. History of sciatica follows with Dr. Josefina orthopedics for epidural injections due for 1 this week.  Since patient has sepsis-like picture with cellulitis will need close monitoring IV antibiotics and more than 2 midnight stay.   DVT prophylaxis: Heparin . Code Status: Full code. Family Communication: Discussed with patient. Disposition Plan: Medical floor. Consults called: Wound team. Admission status: Inpatient

## 2024-03-21 DIAGNOSIS — L03221 Cellulitis of neck: Secondary | ICD-10-CM | POA: Diagnosis not present

## 2024-03-21 LAB — BASIC METABOLIC PANEL WITH GFR
Anion gap: 15 (ref 5–15)
BUN: 25 mg/dL — ABNORMAL HIGH (ref 8–23)
CO2: 22 mmol/L (ref 22–32)
Calcium: 9.4 mg/dL (ref 8.9–10.3)
Chloride: 109 mmol/L (ref 98–111)
Creatinine, Ser: 1.02 mg/dL (ref 0.61–1.24)
GFR, Estimated: >60 mL/min (ref >60)
Glucose, Bld: 108 mg/dL — ABNORMAL HIGH (ref 70–99)
Potassium: 3.7 mmol/L (ref 3.5–5.1)
Sodium: 146 mmol/L — ABNORMAL HIGH (ref 135–145)

## 2024-03-21 LAB — CBC
HCT: 27 % — ABNORMAL LOW (ref 39.0–52.0)
Hemoglobin: 9.5 g/dL — ABNORMAL LOW (ref 13.0–17.0)
MCH: 35.8 pg — ABNORMAL HIGH (ref 26.0–34.0)
MCHC: 35.2 g/dL (ref 30.0–36.0)
MCV: 101.9 fL — ABNORMAL HIGH (ref 80.0–100.0)
Platelets: 192 K/uL (ref 150–400)
RBC: 2.65 MIL/uL — ABNORMAL LOW (ref 4.22–5.81)
RDW: 13.6 % (ref 11.5–15.5)
WBC: 14.1 K/uL — ABNORMAL HIGH (ref 4.0–10.5)
nRBC: 0 % (ref 0.0–0.2)

## 2024-03-21 LAB — MAGNESIUM: Magnesium: 2.1 mg/dL (ref 1.7–2.4)

## 2024-03-21 MED ORDER — AMOXICILLIN-POT CLAVULANATE 875-125 MG PO TABS
1.0000 | ORAL_TABLET | Freq: Two times a day (BID) | ORAL | Status: DC
  Administered 2024-03-21 – 2024-03-23 (×5): 1 via ORAL
  Filled 2024-03-21 (×5): qty 1

## 2024-03-21 MED ORDER — POLYETHYLENE GLYCOL 3350 17 G PO PACK
17.0000 g | PACK | Freq: Every day | ORAL | Status: DC
  Administered 2024-03-21 – 2024-03-22 (×2): 17 g via ORAL
  Filled 2024-03-21 (×2): qty 1

## 2024-03-21 MED ORDER — DOCUSATE SODIUM 100 MG PO CAPS
100.0000 mg | ORAL_CAPSULE | Freq: Two times a day (BID) | ORAL | Status: DC
  Administered 2024-03-21 – 2024-03-23 (×5): 100 mg via ORAL
  Filled 2024-03-21 (×5): qty 1

## 2024-03-21 MED ORDER — DOXYCYCLINE HYCLATE 100 MG PO TABS
100.0000 mg | ORAL_TABLET | Freq: Two times a day (BID) | ORAL | Status: DC
  Administered 2024-03-21 – 2024-03-23 (×5): 100 mg via ORAL
  Filled 2024-03-21 (×5): qty 1

## 2024-03-21 MED ORDER — VANCOMYCIN HCL IN DEXTROSE 1-5 GM/200ML-% IV SOLN
1000.0000 mg | INTRAVENOUS | Status: DC
  Administered 2024-03-21: 1000 mg via INTRAVENOUS
  Filled 2024-03-21: qty 200

## 2024-03-21 NOTE — Progress Notes (Signed)
 Pharmacy Antibiotic Note  MARKIES MOWATT is a 89 y.o. male admitted on 03/19/2024 with cellulitis/wound infection.  Pharmacy has been consulted for Vancomycin  dosing. WBC is elevated. Noted renal dysfunction.   8/22 AM update Scr improved at 1.02  Plan: Adjust Vancomycin  1000 mg IV q24h >>>Estimated AUC: 494 Cefepime  2g IV 24h Trend WBC, temp, renal function  F/U infectious work-up Drug levels as indicated   Height: 5' 6 (167.6 cm) Weight: 67.1 kg (148 lb) IBW/kg (Calculated) : 63.8  Temp (24hrs), Avg:97.9 F (36.6 C), Min:97.4 F (36.3 C), Max:98.1 F (36.7 C)  Recent Labs  Lab 03/19/24 1720 03/19/24 1731 03/20/24 0533 03/20/24 0935 03/21/24 0248  WBC 18.7*  --  13.4*  --  14.1*  CREATININE 1.91*  --  1.58*  --  1.02  LATICACIDVEN  --  2.0*  --  1.0  --     Estimated Creatinine Clearance: 45.2 mL/min (by C-G formula based on SCr of 1.02 mg/dL).    Allergies  Allergen Reactions   Sulfa Antibiotics Rash   Mcarthur Ivins BS, PharmD, BCPS Clinical Pharmacist 03/21/2024 8:15 AM  Contact: [phone] after 3 PM

## 2024-03-21 NOTE — Progress Notes (Signed)
 Performed dressing change left cervical, left side face, used foam and xeroform.

## 2024-03-21 NOTE — Care Management Important Message (Signed)
 Important Message  Patient Details  Name: [name] MRN: [id number] Date of Birth: June 30, 1933   Important Message Given:  Yes - Medicare IM     Jon Cruel 03/21/2024, 11:38 AM

## 2024-03-21 NOTE — Progress Notes (Signed)
 [facility] Progress Note Patient: [name] FMW:[id number] DOB: 29-Oct-1932 DOA: 03/19/2024  DOS: the patient was seen and examined on 03/21/2024  Brief Hospital Course: Patient with PMH of HTN, sciatica, COPD, depression, OSA presents to the hospital with complaints of neck wound. Currently being treated for cellulitis. Assessment and Plan: Cellulitis of the left side of the neck. Myositis involving sternocleidomastoid as well as paraspinal muscles. Presents with a trauma to his neck.  Later on he developed swelling and redness of that area with ongoing pain. CT scan shows evidence of myositis as well as cellulitis. No evidence of drainable pus. Treated with IV antibiotics. Will continue to follow-up on blood cultures. Discussed with ENT, outpatient follow-up recommended.  Sciatica. Follows up with Dr. Josefina. Scheduled to see them next week. Monitor for now. PT OT consulted.  HTN. Blood pressure is soft. Holding medication.  AKI. Hyponatremia. Hypokalemia. Electrolytes are being replaced. Renal function improving with IV hydration. Holding home diuretics.  Related to folic acid  deficiency Iron deficiency anemia with macrocytosis. Outpatient follow-up with PCP for repeat iron levels. Initiating supplementation. Monitor.  Hyperbilirubinemia. Etiology is not clear. Improving with IV hydration. For now we will monitor.  Anion gap metabolic acidosis. Anion gap is 18. Suspect in the setting of AKI. Lactic acid is normal. For now we will monitor with IV hydration.  PT OT consulted. Patient may have difficulty with dressing changes since he lives alone and his neck wound is not accessible that easily. Concerned that poor wound management is the reason why he is here with cellulitis and myositis. Will monitor for improvement.   Subjective: No nausea no vomiting no fever no chills.  Sodium level elevated but likely lab error.  No diarrhea no  constipation.  Physical Exam: Clear to auscultation but S1-S2 present Bowel sound present Still some drainage from his neck wound. No edema.  Data Reviewed: I have Reviewed nursing notes, Vitals, and Lab results. Since last encounter, pertinent lab results CBC and BMP   . I have ordered test including CBC and BMP  .   Disposition: Status is: Inpatient Remains inpatient appropriate because: Monitor for improvement in infection  heparin  injection 5,000 Units Start: 03/20/24 0600   Family Communication: Family at bedside Level of care: Telemetry Medical   Vitals:   03/21/24 0055 03/21/24 0528 03/21/24 0746 03/21/24 1754  BP: (!) 124/54 130/70 134/65 124/63  Pulse: 81 73 80 80  Resp: 17 18 16 16   Temp: 98.1 F (36.7 C) 98.1 F (36.7 C) 97.8 F (36.6 C) 98.5 F (36.9 C)  TempSrc: Oral Oral Oral Oral  SpO2: 98% 95% 97% 97%  Weight:      Height:         Author: Yetta Blanch, MD 03/21/2024 7:10 PM  Please look on [email or web address] to find out who is on call.

## 2024-03-21 NOTE — Evaluation (Signed)
 Physical Therapy Evaluation Patient Details Name: [name] MRN: [id number] DOB: [birth year] Today's Date: 03/21/2024  History of Present Illness  [name] is a 89 y.o. male who presented to [facility] ED 03/19/24 after pt was found to be hypotensive at urgent care. Found to have left neck cellulitis and myositis involving sternocleidomastoid as well as paraspinal muscles. PMHx: HTN, macrocytic anemia, sciatica, COPD, depression, and OSA.   Clinical Impression  Pt admitted with above diagnosis. PTA, pt was modI for functional mobility using rollator and/or RW, independent with ADLs/IADLs, and driving. He lives with his wife in a one story house with 2 STE. Pt reports a significant fall history of >5 but <10 in the past 2 weeks, which he attributes to sciatica. Pt currently with functional limitations due to the deficits listed below (see PT Problem List). He required CGA-minA for bed mobility, CGA for sit<>stand using RW, CGA for gait using RW, and minA for stairs. Pt ambulated ~54ft and ascended/descended 3 steps with BUE support on unilateral handrail. He demonstrated unsteadiness and had one left lateral LOB requiring minA to stabilize. Pt will benefit from acute skilled PT to increase his independence and safety with mobility to allow discharge. Given the pt's CLOF, limited family support, impaired skin integrity, and high fall risk recommend continued inpatient follow up therapy, <3 hours/day.    If plan is discharge home, recommend the following: A little help with walking and/or transfers;A little help with bathing/dressing/bathroom;Assistance with cooking/housework;Help with stairs or ramp for entrance   Can travel by private vehicle   Yes    Equipment Recommendations None recommended by PT (Pt already has DME)  Recommendations for Other Services       Functional Status Assessment Patient has had a recent decline in their functional status and demonstrates the ability to make  significant improvements in function in a reasonable and predictable amount of time.     Precautions / Restrictions Precautions Precautions: Fall Recall of Precautions/Restrictions: Intact Restrictions Weight Bearing Restrictions Per Provider Order: No      Mobility  Bed Mobility Overal bed mobility: Needs Assistance Bed Mobility: Supine to Sit, Sit to Supine     Supine to sit: Contact guard, HOB elevated, Used rails, Min assist Sit to supine: Contact guard assist   General bed mobility comments: Pt sat up on L side of bed with increased time. MinA to bring RLE off EOB. Assist to elevate trunk. Returning to bed pt brought BLE back into bed. Repositioned himself in the center by scooting.    Transfers Overall transfer level: Needs assistance Equipment used: Rolling walker (2 wheels) Transfers: Sit to/from Stand Sit to Stand: Contact guard assist           General transfer comment: Pt stood from lowest bed height. Cued proper hand placement using RW. Powered up with CGA. Good eccentric control.    Ambulation/Gait Ambulation/Gait assistance: Contact guard assist Gait Distance (Feet): 80 Feet Assistive device: Rolling walker (2 wheels) Gait Pattern/deviations: Step-through pattern, Decreased stride length, Trunk flexed, Drifts right/left Gait velocity: decreased Gait velocity interpretation: <1.8 ft/sec, indicate of risk for recurrent falls   General Gait Details: Pt ambulated with short slow steps, even weight shift, and good foot clearence. His postural sway increased as he fatigued. Pt with difficulty advancing RLE d/t pain. He had a fwd lean over RW. Cues for proximity to RW. Pt navigated room/hallway well.  Stairs Stairs: Yes Stairs assistance: Min assist Stair Management: One rail Right, Forwards, Step to  pattern Number of Stairs: 3 General stair comments: Educated pt to ascend leading with LLE and descend leading with RLE. Offered pt HHA on LUE given the railings  were too far apart compared to his at home. Pt opted to maintain BUE support on unilat rail. Instructed pt to turns sideways for improved support. He powered up with minA, significant B knee flex while advancing, and heavy reliance on UE support.  Wheelchair Mobility     Tilt Bed    Modified Rankin (Stroke Patients Only)       Balance Overall balance assessment: Needs assistance, History of Falls Sitting-balance support: No upper extremity supported, Feet supported Sitting balance-Leahy Scale: Fair Sitting balance - Comments: Pt sat EOB with supervision.   Standing balance support: Bilateral upper extremity supported, During functional activity, Reliant on assistive device for balance Standing balance-Leahy Scale: Poor Standing balance comment: Pt dependent on RW                             Pertinent Vitals/Pain Pain Assessment Pain Assessment: 0-10 Pain Score: 6  Pain Location: RLE Pain Descriptors / Indicators: Constant, Burning, Shooting, Sharp Pain Intervention(s): Monitored during session, Limited activity within patient's tolerance, Repositioned    Home Living Family/patient expects to be discharged to:: Private residence Living Arrangements: Spouse/significant other Available Help at Discharge: Family;Available 24 hours/day (Wife 24/7 supervision, limited ability to assist as she had a fall resulting in injuries, but no fx and required HHPT. Daughters live out of state.) Type of Home: House Home Access: Stairs to enter Entrance Stairs-Rails: Right;Left;Can reach both Secretary/administrator of Steps: 2   Home Layout: One level Home Equipment: Grab bars - toilet;Grab bars - tub/shower;Shower seat - built in;Hand held Engineer, civil (consulting) - single point;Rollator (4 wheels);Rolling Walker (2 wheels)      Prior Function Prior Level of Function : Independent/Modified Independent;History of Falls (last six months);Driving             Mobility Comments:  Ambulates using rollator inside the house and RW outside the house. Several falls between 5-10 in the past 2 weeks d/t sciatica. ADLs Comments: Indep with ADLs/IADLs. Assists in taking care of his wife.     Extremity/Trunk Assessment   Upper Extremity Assessment Upper Extremity Assessment: Generalized weakness;Right hand dominant    Lower Extremity Assessment Lower Extremity Assessment: Generalized weakness       Communication   Communication Communication: Impaired Factors Affecting Communication: Hearing impaired    Cognition Arousal: Alert Behavior During Therapy: WFL for tasks assessed/performed   PT - Cognitive impairments: No apparent impairments                       PT - Cognition Comments: Pt A,Ox4 Following commands: Intact       Cueing Cueing Techniques: Verbal cues     General Comments General comments (skin integrity, edema, etc.): Significant erythema of his left neck with sloughing skin.    Exercises     Assessment/Plan    PT Assessment Patient needs continued PT services  PT Problem List Decreased balance;Decreased mobility;Decreased strength;Decreased activity tolerance;Decreased knowledge of use of DME;Decreased safety awareness       PT Treatment Interventions DME instruction;Gait training;Stair training;Functional mobility training;Therapeutic activities;Therapeutic exercise;Balance training;Patient/family education    PT Goals (Current goals can be found in the Care Plan section)  Acute Rehab PT Goals Patient Stated Goal: Return Home PT Goal Formulation: With patient Time For  Goal Achievement: 04/04/24 Potential to Achieve Goals: Good    Frequency Min 2X/week     Co-evaluation               AM-PAC PT 6 Clicks Mobility  Outcome Measure Help needed turning from your back to your side while in a flat bed without using bedrails?: A Little Help needed moving from lying on your back to sitting on the side of a flat bed  without using bedrails?: A Little Help needed moving to and from a bed to a chair (including a wheelchair)?: A Little Help needed standing up from a chair using your arms (e.g., wheelchair or bedside chair)?: A Little Help needed to walk in hospital room?: A Little Help needed climbing 3-5 steps with a railing? : A Little 6 Click Score: 18    End of Session Equipment Utilized During Treatment: Gait belt Activity Tolerance: Patient tolerated treatment well Patient left: in bed;with call bell/phone within reach;with bed alarm set Nurse Communication: Mobility status PT Visit Diagnosis: History of falling (Z91.81);Repeated falls (R29.6);Muscle weakness (generalized) (M62.81);Unsteadiness on feet (R26.81);Other abnormalities of gait and mobility (R26.89)    Time: 1329-1401 PT Time Calculation (min) (ACUTE ONLY): 32 min   Charges:   PT Evaluation $PT Eval Moderate Complexity: 1 Mod PT Treatments $Gait Training: 8-22 mins PT General Charges $$ ACUTE PT VISIT: 1 Visit         Randall SAUNDERS, PT, DPT Acute Rehabilitation Services Office: [phone] Secure Chat Preferred  Delon CHRISTELLA Callander 03/21/2024, 3:29 PM

## 2024-03-21 NOTE — Plan of Care (Signed)
   Problem: Activity: Goal: Risk for activity intolerance will decrease Outcome: Progressing   Problem: Safety: Goal: Ability to remain free from injury will improve Outcome: Progressing   Problem: Skin Integrity: Goal: Risk for impaired skin integrity will decrease Outcome: Progressing

## 2024-03-22 DIAGNOSIS — L03221 Cellulitis of neck: Secondary | ICD-10-CM | POA: Diagnosis not present

## 2024-03-22 LAB — BASIC METABOLIC PANEL WITH GFR
Anion gap: 6 (ref 5–15)
BUN: 18 mg/dL (ref 8–23)
CO2: 22 mmol/L (ref 22–32)
Calcium: 8.9 mg/dL (ref 8.9–10.3)
Chloride: 107 mmol/L (ref 98–111)
Creatinine, Ser: 1.03 mg/dL (ref 0.61–1.24)
GFR, Estimated: >60 mL/min (ref >60)
Glucose, Bld: 98 mg/dL (ref 70–99)
Potassium: 3.4 mmol/L — ABNORMAL LOW (ref 3.5–5.1)
Sodium: 135 mmol/L (ref 135–145)

## 2024-03-22 LAB — CBC
HCT: 29.4 % — ABNORMAL LOW (ref 39.0–52.0)
Hemoglobin: 10.1 g/dL — ABNORMAL LOW (ref 13.0–17.0)
MCH: 35.4 pg — ABNORMAL HIGH (ref 26.0–34.0)
MCHC: 34.4 g/dL (ref 30.0–36.0)
MCV: 103.2 fL — ABNORMAL HIGH (ref 80.0–100.0)
Platelets: 169 K/uL (ref 150–400)
RBC: 2.85 MIL/uL — ABNORMAL LOW (ref 4.22–5.81)
RDW: 13.3 % (ref 11.5–15.5)
WBC: 8.5 K/uL (ref 4.0–10.5)
nRBC: 0 % (ref 0.0–0.2)

## 2024-03-22 LAB — MAGNESIUM: Magnesium: 1.8 mg/dL (ref 1.7–2.4)

## 2024-03-22 MED ORDER — POLYETHYLENE GLYCOL 3350 17 G PO PACK: 17.0000 g | PACK | Freq: Every day | ORAL | 0 refills | Status: DC

## 2024-03-22 MED ORDER — FOLIC ACID 1 MG PO TABS: 1.0000 mg | ORAL_TABLET | Freq: Every day | ORAL | 0 refills | Status: AC

## 2024-03-22 MED ORDER — CVS FOAM ADHESIVE DRESSING EX PADS: 2.0000 | MEDICATED_PAD | Freq: Every day | CUTANEOUS | 0 refills | Status: DC

## 2024-03-22 MED ORDER — JUVEN PO PACK
1.0000 | PACK | Freq: Two times a day (BID) | ORAL | 0 refills | Status: DC
Start: 2024-03-22 — End: 2024-03-28

## 2024-03-22 MED ORDER — AMOXICILLIN-POT CLAVULANATE 875-125 MG PO TABS: 1.0000 | ORAL_TABLET | Freq: Two times a day (BID) | ORAL | 0 refills | Status: AC

## 2024-03-22 MED ORDER — JUVEN PO PACK
1.0000 | PACK | Freq: Two times a day (BID) | ORAL | Status: DC
  Administered 2024-03-22 – 2024-03-23 (×2): 1 via ORAL
  Filled 2024-03-22 (×2): qty 1

## 2024-03-22 MED ORDER — XEROFORM PETROLAT PATCH 2"X2" EX PADS: 4.0000 | MEDICATED_PAD | Freq: Every day | CUTANEOUS | 0 refills | Status: DC

## 2024-03-22 MED ORDER — DOXYCYCLINE HYCLATE 100 MG PO TABS: 100.0000 mg | ORAL_TABLET | Freq: Two times a day (BID) | ORAL | 0 refills | Status: AC

## 2024-03-22 MED ORDER — B COMPLEX-C PO TABS
1.0000 | ORAL_TABLET | Freq: Every day | ORAL | 0 refills | Status: AC
Start: 2024-03-23 — End: ?

## 2024-03-22 NOTE — Plan of Care (Signed)
   Problem: Education: Goal: Knowledge of General Education information will improve Description Including pain rating scale, medication(s)/side effects and non-pharmacologic comfort measures Outcome: Progressing

## 2024-03-22 NOTE — Progress Notes (Signed)
 Mobility Specialist Progress Note:    03/22/24 1100  Mobility  Activity Ambulated with assistance (In hallway/ to BR)  Level of Assistance Contact guard assist, steadying assist  Assistive Device Front wheel walker  Distance Ambulated (ft) 110 ft  Activity Response Tolerated well  Mobility Referral Yes  Mobility visit 1 Mobility  Mobility Specialist Start Time (ACUTE ONLY) 1044  Mobility Specialist Stop Time (ACUTE ONLY) 1059  Mobility Specialist Time Calculation (min) (ACUTE ONLY) 15 min   Received pt in bed and agreeable to mobility. No physical assistance needed. C/o RLE pain, otherwise tolerated well. Returned to room and pt requested to use the BR. Voided successfully. Pt left in bed with alarm on. Personal belongings and call light within reach. All needs met.  Lavanda Pollack Mobility Specialist  Please contact via [employer] or  Rehab Office [phone]

## 2024-03-22 NOTE — Progress Notes (Signed)
 [facility] Progress Note Patient: [name] FMW:[id number] DOB: 1932/10/15 DOA: 03/19/2024  DOS: the patient was seen and examined on 03/22/2024  Brief Hospital Course: Patient with PMH of HTN, sciatica, COPD, depression, OSA presents to the hospital with complaints of neck wound. Currently being treated for cellulitis. Assessment and Plan: Cellulitis of the left side of the neck. Myositis involving sternocleidomastoid as well as paraspinal muscles. Presents with a trauma to his neck.  Later on he developed swelling and redness of that area with ongoing pain. CT scan shows evidence of myositis as well as cellulitis. No evidence of drainable pus. Treated with IV antibiotics. Will continue to follow-up on blood cultures. Discussed with ENT, outpatient follow-up recommended.  Sciatica. Follows up with Dr. Josefina. Scheduled to see them next week. Monitor for now. PT OT consulted.  Presented with 3 falls.  HTN. Blood pressure is soft. Holding medication.  AKI. Hyponatremia. Hypokalemia. Electrolytes are being replaced. Renal function improving with IV hydration. Holding home diuretics.  Related to folic acid  deficiency Iron deficiency anemia with macrocytosis. Outpatient follow-up with PCP for repeat iron levels. Initiating supplementation. Monitor.  Hyperbilirubinemia. Etiology is not clear. Improving with IV hydration. For now we will monitor.  Anion gap metabolic acidosis. Anion gap is 18. Suspect in the setting of AKI. Lactic acid is normal. For now we will monitor with IV hydration.  PT OT consulted. Patient may have difficulty with dressing changes since he lives alone and his neck wound is not accessible that easily. Concerned that poor wound management is the reason why he is here with cellulitis and myositis. Will monitor for improvement.  Not safe to go home without dressing change arrangements.   Subjective: No nausea no vomiting no fever no  chills.  Improving pain.  Physical Exam: Clear to auscultation. S1-S2 present junctional bowel sound present. Left elbow redness seen with warmth. Neck infection appears to be improving.  Skin appears to be sloughing off.  Data Reviewed: I have Reviewed nursing notes, Vitals, and Lab results. Since last encounter, pertinent lab results CBC and BMP   .   Disposition: Status is: Inpatient Remains inpatient appropriate because: Arrangement for safe dispo  heparin  injection 5,000 Units Start: 03/20/24 0600   Family Communication: Discussed with daughter on the phone Level of care: Telemetry Medical   Vitals:   03/21/24 1954 03/22/24 0605 03/22/24 0834 03/22/24 1620  BP: (!) 110/49 (!) 143/64 (!) 127/54 123/62  Pulse: 84 73 77 76  Resp: 20 20 17 15   Temp: 98.8 F (37.1 C) 98.4 F (36.9 C) 98.3 F (36.8 C) 98.2 F (36.8 C)  TempSrc: Oral Oral Oral Oral  SpO2: 99% 97% 98% 100%  Weight:      Height:         Author: Yetta Blanch, MD 03/22/2024 5:55 PM  Please look on [email or web address] to find out who is on call.

## 2024-03-22 NOTE — TOC Progression Note (Addendum)
 Transition of Care [facility]) - Progression Note    Patient Details  Name: [name] MRN: [id number] Date of Birth: 15-Oct-1932  [facility] CM/SW Contact  Robynn Eileen Hoose, RN Phone Number: 03/22/2024, 1:44 PM  Clinical Narrative:  Secure message from staff that patient does not want to go to SNF rehab facility. Attempt made to find home health services, no agency to accept patient at this time. Message left for Amy with [facility], awaiting response. Spoke with patient and daughter to discuss the above and the risk of not getting proper wound care, pt still wanting to go home.    1400: Message to [place] with [facility], awaiting response. Referral faxed to [facility] for review. Amy with [facility] unable to accept pt. Cheryl with [facility] unable to accept patient.                         Expected Discharge Plan and Services                                               Social Drivers of Health (SDOH) Interventions SDOH Screenings   Food Insecurity: No Food Insecurity (03/20/2024)  Housing: Low Risk  (03/20/2024)  Transportation Needs: No Transportation Needs (03/20/2024)  Utilities: Not At Risk (03/20/2024)  Alcohol Screen: Low Risk  (05/28/2023)  Depression (PHQ2-9): Low Risk  (05/28/2023)  Financial Resource Strain: Low Risk  (05/28/2023)  Physical Activity: Sufficiently Active (05/28/2023)  Social Connections: Moderately Isolated (03/20/2024)  Stress: No Stress Concern Present (05/28/2023)  Tobacco Use: Medium Risk (03/20/2024)  Health Literacy: Adequate Health Literacy (05/28/2023)    Readmission Risk Interventions     No data to display

## 2024-03-22 NOTE — Progress Notes (Signed)
 CSW has started the SNF process, at this time [facility] will continue to follow and provide beds offers once available.   [place] Jaran Sainz LCSW-A   03/22/2024 11:55 AM

## 2024-03-22 NOTE — NC FL2 (Signed)
 [place]  MEDICAID FL2 LEVEL OF CARE FORM     IDENTIFICATION  Patient Name: [name] Birthdate: [birth year] Sex: male Admission Date (Current Location): 03/19/2024  [facility] and [place] Number:  Producer, television/film/video and Address:  [facility], [address] [place], [place] [address]      Provider Number: [id number]  Attending Physician Name and Address:  Tobie Yetta HERO, MD  Relative Name and Phone Number:       Current Level of Care: Hospital Recommended Level of Care: Skilled Nursing Facility Prior Approval Number:    Date Approved/Denied: 03/22/24 PASRR Number: [id number]  Discharge Plan: SNF    Current Diagnoses: Patient Active Problem List   Diagnosis Date Noted   ARF (acute renal failure) (HCC) 03/20/2024   Hyponatremia 03/20/2024   Hypokalemia 03/20/2024   Cellulitis 03/19/2024   Renal insufficiency 06/18/2023   Chronic sciatica of left side 05/04/2022   Gassiness 05/04/2022   Unsteadiness on feet 04/22/2021   Skin lesions 04/20/2020   Memory deficit 03/19/2019   Ejaculatory disorder 03/17/2018   Abnormal drug screen 09/17/2017   Positive test for herpes simplex virus (HSV) antibody 05/12/2015   Health maintenance examination 03/11/2015   Advanced care planning/counseling discussion 03/11/2015   Macrocytic anemia 02/06/2015   Presbycusis of both ears    Medicare annual wellness visit, subsequent 02/10/2014   Insomnia 11/25/2013   HTN (hypertension)    Elevated PSA    History of smoking    History of alcohol use    COPD (chronic obstructive pulmonary disease) (HCC)     Orientation RESPIRATION BLADDER Height & Weight     Time, Self, Situation, Place  Normal Continent Weight: 67.1 kg Height:  5' 6 (167.6 cm)  BEHAVIORAL SYMPTOMS/MOOD NEUROLOGICAL BOWEL NUTRITION STATUS      Incontinent Diet  AMBULATORY STATUS COMMUNICATION OF NEEDS Skin   Limited Assist Verbally Other (Comment) (Clear to auscultation.  S1-S2  present.  No focal deficit.  Alert awake and oriented x 3.  Significant erythema of his left neck with sloughing skin.)                       Personal Care Assistance Level of Assistance  Bathing, Feeding Bathing Assistance: Limited assistance Feeding assistance: Independent       Functional Limitations Info  Hearing, Speech, Sight Sight Info: Adequate Hearing Info: Adequate Speech Info: Adequate    SPECIAL CARE FACTORS FREQUENCY                       Contractures Contractures Info: Not present    Additional Factors Info  Code Status, Allergies Code Status Info: FULL Allergies Info: SULFA ANTIBIOTICS           Current Medications (03/22/2024):  This is the current hospital active medication list Current Facility-Administered Medications  Medication Dose Route Frequency Provider Last Rate Last Admin   acetaminophen  (TYLENOL ) tablet 650 mg  650 mg Oral Q6H PRN Kakrakandy, Arshad N, MD   650 mg at 03/22/24 1044   Or   acetaminophen  (TYLENOL ) suppository 650 mg  650 mg Rectal Q6H PRN Kakrakandy, Arshad N, MD       amoxicillin -clavulanate (AUGMENTIN ) 875-125 MG per tablet 1 tablet  1 tablet Oral Q12H Patel, Pranav M, MD   1 tablet at 03/22/24 1034   B-complex with vitamin C tablet 1 tablet  1 tablet Oral Daily Patel, Pranav M, MD   1 tablet at  03/22/24 1034   docusate sodium  (COLACE) capsule 100 mg  100 mg Oral BID Patel, Pranav M, MD   100 mg at 03/22/24 1034   doxycycline  (VIBRA -TABS) tablet 100 mg  100 mg Oral Q12H Patel, Pranav M, MD   100 mg at 03/22/24 1034   folic acid  (FOLVITE ) tablet 1 mg  1 mg Oral Daily Patel, Pranav M, MD   1 mg at 03/22/24 1035   heparin  injection 5,000 Units  5,000 Units Subcutaneous Q8H Kakrakandy, Arshad N, MD   5,000 Units at 03/22/24 9364   melatonin tablet 3 mg  3 mg Oral QHS Kakrakandy, Arshad N, MD   3 mg at 03/21/24 2138   polyethylene glycol (MIRALAX  / GLYCOLAX ) packet 17 g  17 g Oral Daily Patel, Pranav M, MD   17 g at  03/22/24 1034     Discharge Medications: Please see discharge summary for a list of discharge medications.  Relevant Imaging Results:  Relevant Lab Results:   Additional Information [id number]  Donnice ORN Shakura Cowing, LCSW

## 2024-03-22 NOTE — Plan of Care (Signed)

## 2024-03-22 NOTE — Progress Notes (Signed)
 Called to room by family and patient.  Patient very upset wants things taken off disconnected and stating that he had been waiting for 2 hours for someone to get him out of here.  Advised that at this point I do not have discharge orders.  CM is attempting to get a home health agency and we are waiting for an answer.  After speaking with daughter she stated she does not leave In the area and was leaving for home tonight.  There is someone at the house who is in the healthcare field but she hates my dad and I don't think she knows anything about the dressing needing to be done nor would she be willing.  Therefore she was willing to video tape it for her sister and play it for her.  Advised that is against the policy.  Both patient and family agreed that he would stay the night until there is a safe plan for discharge

## 2024-03-23 ENCOUNTER — Other Ambulatory Visit: Payer: Self-pay | Admitting: Internal Medicine

## 2024-03-23 DIAGNOSIS — L03221 Cellulitis of neck: Secondary | ICD-10-CM | POA: Diagnosis not present

## 2024-03-23 MED ORDER — FERROUS SULFATE 325 (65 FE) MG PO TBEC: 325.0000 mg | DELAYED_RELEASE_TABLET | Freq: Every day | ORAL | 0 refills | Status: DC

## 2024-03-23 MED ORDER — ENSURE MAX PROTEIN PO LIQD: 11.0000 [oz_av] | Freq: Every day | ORAL | 0 refills | Status: DC

## 2024-03-23 NOTE — Discharge Summary (Signed)
 Physician Discharge Summary   Patient: BERWYN BIGLEY MRN: [id number] DOB: 04-Nov-1932  Admit date:     03/19/2024  Discharge date: 03/23/2024  Discharge Physician: Yetta Blanch  PCP: [name], MD  Recommendations at discharge: Follow-up with PCP in 1 week. Follow-up with ENT    Follow-up Information     [name], MD. Schedule an appointment as soon as possible for a visit in 1 week(s).   Specialty: Family Medicine Why: For wound re-check Contact information: [address] [place] [address] [phone]         Jesus Oliphant, MD. Schedule an appointment as soon as possible for a visit in 1 week(s).   Specialty: Otolaryngology Why: For wound re-check Contact information: [address] [place] [address] [phone]                Discharge Diagnoses: Principal Problem:   Cellulitis Active Problems:   HTN (hypertension)   COPD (chronic obstructive pulmonary disease) (HCC)   Macrocytic anemia   ARF (acute renal failure) (HCC)   Hyponatremia   Hypokalemia  Hospital Course: Patient with PMH of HTN, sciatica, COPD, depression, OSA presents to the hospital with complaints of neck wound. Currently being treated for cellulitis and sternocleidomastoid myositis.  Assessment and Plan: Cellulitis of the left side of the neck. Myositis involving sternocleidomastoid as well as paraspinal muscles. Presents with a trauma to his neck.  Later on he developed swelling and redness of that area with ongoing pain. CT scan shows evidence of myositis as well as cellulitis. No evidence of drainable pus. Treated with IV antibiotics. Blood cultures negative. Discussed with ENT, outpatient follow-up recommended. Pain is resolved. No drainable abscess.  No significant swelling. Possible cellulitis of the left elbow as well which also appears to be improving. Will continue the antibiotics for 7 more days.  Sciatica. Follows up  with Dr. Josefina. Scheduled to see them next week. PT OT consulted.  Presented with 3 falls. Able to ambulate with physical therapy.  Wants to go home.  HTN. Blood pressure is stable without any medication. Holding medication. Follow-up with PCP recommended.  AKI. Hyponatremia. Hypokalemia. Electrolytes are being replaced. Renal function improving with IV hydration. Holding home diuretics.  Related to folic acid  deficiency Iron deficiency anemia with macrocytosis. Outpatient follow-up with PCP for repeat iron levels. Initiating supplementation. Monitor.  Hyperbilirubinemia. In the setting of infection. Improving with IV hydration. Outpatient follow-up recommended.  Anion gap metabolic acidosis. Anion gap is 18. Suspect in the setting of AKI. Lactic acid is normal. Resolved with IV fluid.  Dressing change requirement. Patient reports that the family will perform dressing changes for the next 1 week as well as caregiver after that.  Consultants:  None  Procedures performed:  None  DISCHARGE MEDICATION: Allergies as of 03/23/2024       Reactions   Sulfa Antibiotics Rash        Medication List     STOP taking these medications    metoprolol  tartrate 25 MG tablet Commonly known as: LOPRESSOR    triamterene -hydrochlorothiazide 37.5-25 MG tablet Commonly known as: MAXZIDE-25       TAKE these medications    amoxicillin -clavulanate 875-125 MG tablet Commonly known as: AUGMENTIN  Take 1 tablet by mouth 2 (two) times daily for 7 days.   B-complex with vitamin C tablet Take 1 tablet by mouth daily.   CoQ-10 50 MG Caps Take by mouth daily.   [employer] Foam Adhesive Dressing Pads Apply 2 each  topically daily at 6 (six) AM.   doxycycline  100 MG tablet Commonly known as: VIBRA -TABS Take 1 tablet (100 mg total) by mouth every 12 (twelve) hours for 7 days.   folic acid  1 MG tablet Commonly known as: FOLVITE  Take 1 tablet (1 mg total) by mouth daily.    Ginkgo Biloba 40 MG Tabs Take 2 tablets (80 mg total) by mouth daily.   ibuprofen 200 MG tablet Commonly known as: ADVIL Take 200 mg by mouth every 6 (six) hours as needed for mild pain (pain score 1-3) (Leg pain).   melatonin 3 MG Tabs tablet Take 3 mg by mouth at bedtime.   multivitamin tablet Take 1 tablet by mouth daily.   nutrition supplement (JUVEN) Pack Take 1 packet by mouth 2 (two) times daily between meals.   Ensure Max Protein Liqd Take 330 mLs (11 oz total) by mouth daily.   polyethylene glycol 17 g packet Commonly known as: MIRALAX  / GLYCOLAX  Take 17 g by mouth daily.   Potassium 99 MG Tabs Take 1 tablet (99 mg total) by mouth every Monday, Wednesday, and Friday.   PreserVision AREDS 2 Caps Take 1 capsule by mouth daily.   SAW PALMETTO COMPLEX PO Take 250 mg by mouth every other day.   Xeroform Petrolat Patch 2x2 Pads Apply 4 each topically daily at 6 (six) AM.               Discharge Care Instructions  (From admission, onward)           Start     Ordered   03/22/24 0000  Discharge wound care:       Comments: Cleanse neck, right pretibial, and left forearm wound with saline, pat dry Cover any weeping areas with single layer of xeroform gauze top with foam. Change every day   03/22/24 1333           Disposition: Home Diet recommendation: Cardiac diet  Discharge Exam: Vitals:   03/22/24 1620 03/22/24 2023 03/23/24 0411 03/23/24 0743  BP: 123/62 127/61 (!) 107/58 (!) 134/57  Pulse: 76 74 79 77  Resp: 15  17 18   Temp: 98.2 F (36.8 C) 98.4 F (36.9 C) 98.1 F (36.7 C) 98.2 F (36.8 C)  TempSrc: Oral Oral Oral Oral  SpO2: 100% 100% 97% 99%  Weight:      Height:       Clear to auscultation. S1-S2 present. Improving erythema as well as induration of the neck area on the left. No new abscess. Improving edema and erythema involving the left elbow.  Filed Weights   03/19/24 1710  Weight: 67.1 kg   Condition at  discharge: stable  The results of significant diagnostics from this hospitalization (including imaging, microbiology, ancillary and laboratory) are listed below for reference.   Imaging Studies: CT C-SPINE NO CHARGE Result Date: 03/19/2024 CLINICAL DATA:  Left lateral neck pain after frequent falls. EXAM: CT CERVICAL SPINE WITHOUT CONTRAST TECHNIQUE: Multidetector CT imaging of the cervical spine was performed without intravenous contrast. Multiplanar CT image reconstructions were also generated. RADIATION DOSE REDUCTION: This exam was performed according to the departmental dose-optimization program which includes automated exposure control, adjustment of the mA and/or kV according to patient size and/or use of iterative reconstruction technique. COMPARISON:  None Available. FINDINGS: Alignment: No substantial sagittal subluxation. Skull base and vertebrae: No evidence of acute fracture. Soft tissues and spinal canal: No prevertebral fluid or swelling. Please see concurrent CT of the neck for additional soft tissue findings  in the neck. Disc levels: Moderate lower cervical degenerative change. Left lateral osteophyte at C5-C6. Upper chest: Lung apices are clear. IMPRESSION: No evidence of acute fracture or malalignment. Electronically Signed   By: Gilmore GORMAN Molt M.D.   On: 03/19/2024 22:31   CT Soft Tissue Neck W Contrast Result Date: 03/19/2024 CLINICAL DATA:  neck wound possibly infected left side EXAM: CT NECK WITH CONTRAST TECHNIQUE: Multidetector CT imaging of the neck was performed using the standard protocol following the bolus administration of intravenous contrast. RADIATION DOSE REDUCTION: This exam was performed according to the departmental dose-optimization program which includes automated exposure control, adjustment of the mA and/or kV according to patient size and/or use of iterative reconstruction technique. CONTRAST:  75mL OMNIPAQUE  IOHEXOL  350 MG/ML SOLN COMPARISON:  None Available.  FINDINGS: Pharynx and larynx: Normal. No mass or swelling. Salivary glands: No inflammation, mass, or stone. Thyroid : Normal. Lymph nodes: None enlarged or abnormal density. Vascular: Limited assessment due to non arterial timing. Bite lateral calcific atherosclerosis at the carotid bifurcations. Aortic atherosclerosis. Limited intracranial: See dedicated CT head. Visualized orbits: Negative. Mastoids and visualized paranasal sinuses: Clear. Skeleton: See dedicated CT of the cervical spine. Upper chest: Lung apices are clear. Other: Moderate edema in the left neck, tracking along the sternocleidomastoid and posterior paraspinal musculature. No discrete drainable fluid collection. IMPRESSION: Moderate edema in the left neck, tracking along the sternocleidomastoid and posterior paraspinal musculature. Findings are nonspecific but could be posttraumatic or infectious in etiology. No discrete drainable fluid collection. Electronically Signed   By: Gilmore GORMAN Molt M.D.   On: 03/19/2024 22:00   CT Head Wo Contrast Result Date: 03/19/2024 CLINICAL DATA:  Head trauma, moderate-severe EXAM: CT HEAD WITHOUT CONTRAST TECHNIQUE: Contiguous axial images were obtained from the base of the skull through the vertex without intravenous contrast. RADIATION DOSE REDUCTION: This exam was performed according to the departmental dose-optimization program which includes automated exposure control, adjustment of the mA and/or kV according to patient size and/or use of iterative reconstruction technique. COMPARISON:  CT head January 07, 2021. FINDINGS: Brain: No evidence of acute infarction, hemorrhage, hydrocephalus, extra-axial collection or mass lesion/mass effect. Vascular: No hyperdense vessel. Skull: No acute fracture. Sinuses/Orbits: Clear sinuses.  No acute orbital findings. Other: No mastoid effusions. IMPRESSION: No evidence of acute intracranial abnormality. Electronically Signed   By: Gilmore GORMAN Molt M.D.   On: 03/19/2024  21:55    Microbiology: Results for orders placed or performed during the hospital encounter of 01/17/09  Culture, blood (routine x 2)     Status: None   Collection Time: 01/17/09  4:50 PM   Specimen: BLOOD  Result Value Ref Range Status   Specimen Description BLOOD FEMORAL ARTERY LEFT  Final   Special Requests NONE  Final   Culture   Final    ESCHERICHIA COLI Note: Gram Stain Report Called to,Read Back By and Verified With: RUBY JOHNSON 01/18/09 0830 BY SMITHERSJ SUSCEPTIBILITIES PERFORMED ON PREVIOUS CULTURE WITHIN THE LAST 5 DAYS.   Report Status 01/22/2009 FINAL  Final  Urine culture     Status: None   Collection Time: 01/17/09  4:59 PM   Specimen: Urine, Clean Catch  Result Value Ref Range Status   Specimen Description URINE, CLEAN CATCH  Final   Special Requests NONE  Final   Colony Count NO GROWTH  Final   Culture NO GROWTH  Final   Report Status 01/19/2009 FINAL  Final  Culture, blood (routine x 2)     Status: None   Collection  Time: 01/17/09  5:15 PM   Specimen: BLOOD  Result Value Ref Range Status   Specimen Description BLOOD RIGHT ARM  Final   Special Requests   Final    BOTTLES DRAWN AEROBIC AND ANAEROBIC 10CC IN BOTH BOTTLES   Culture   Final    ESCHERICHIA COLI Note: Confirmed Extended Spectrum Beta-Lactamase Producer (ESBL) Two isolates with different morphologies were identified as the same organism.The most resistant organism was reported. CRITICAL RESULT CALLED TO, READ BACK BY AND VERIFIED WITH: ALICIA  PARKER 01/22/2009 AT 14:00 BY VERAR CLOSTRIDIUM PERFRINGENS Note: Gram Stain Report Called to,Read Back By and Verified With: RUBY JOHNSON 01/18/09 0830 BY SMITHERSJ   Report Status 01/22/2009 FINAL  Final   Organism ID, Bacteria ESCHERICHIA COLI  Final      Susceptibility   Escherichia coli - MIC    AMPICILLIN  >=32 Resistant     AMPICILLIN /SULBACTAM 8 Sensitive     CEFAZOLIN >=64 Resistant     CEFEPIME  RESISTANT      CEFTAZIDIME RESISTANT      CEFTRIAXONE   >=64 Resistant     CIPROFLOXACIN <=0.25 Sensitive     GENTAMICIN <=1 Sensitive     IMIPENEM <=1 Sensitive     TOBRAMYCIN <=1 Sensitive     TRIMETH/SULFA >=320 Resistant     CEFOXITIN <=4 Sensitive   Culture, blood (routine x 2)     Status: None   Collection Time: 01/18/09  2:30 AM   Specimen: BLOOD  Result Value Ref Range Status   Specimen Description BLOOD ALINE  Final   Special Requests BOTTLES DRAWN AEROBIC AND ANAEROBIC UNKNOWN VOL  Final   Culture NO GROWTH 5 DAYS  Final   Report Status 01/24/2009 FINAL  Final  Culture, bal-quantitative     Status: None   Collection Time: 01/21/09  8:34 AM   Specimen: Bronchoalveolar Lavage  Result Value Ref Range Status   Specimen Description BRONCHIAL ALVEOLAR LAVAGE ENDOTRAC ASP  Final   Special Requests ZOSYN IMMUNE:NORM  Final   Gram Stain   Final    MODERATE WBC PRESENT, PREDOMINANTLY PMN NO SQUAMOUS EPITHELIAL CELLS SEEN NO ORGANISMS SEEN   Colony Count NO GROWTH  Final   Culture NO GROWTH 2 DAYS  Final   Report Status 01/23/2009 FINAL  Final  Culture, blood (routine x 2)     Status: None   Collection Time: 01/21/09  9:35 AM   Specimen: BLOOD  Result Value Ref Range Status   Specimen Description BLOOD LEFT ARM  Final   Special Requests BOTTLES DRAWN AEROBIC ONLY 1CC  Final   Culture NO GROWTH 5 DAYS  Final   Report Status 01/27/2009 FINAL  Final  Culture, blood (routine x 2)     Status: None   Collection Time: 01/21/09  9:40 AM   Specimen: BLOOD  Result Value Ref Range Status   Specimen Description BLOOD RIGHT ARM  Final   Special Requests BOTTLES DRAWN AEROBIC AND ANAEROBIC 8CC EA  Final   Culture NO GROWTH 5 DAYS  Final   Report Status 01/27/2009 FINAL  Final  Urine culture     Status: None   Collection Time: 01/21/09 12:01 PM   Specimen: Urine, Catheterized  Result Value Ref Range Status   Specimen Description URINE, CATHETERIZED  Final   Special Requests ZOSYN IMMUNE:NORM UT SYMPT:NEG  Final   Colony Count NO GROWTH   Final   Culture NO GROWTH  Final   Report Status 01/22/2009 FINAL  Final   Labs: CBC:  Recent Labs  Lab 03/19/24 1720 03/20/24 0533 03/21/24 0248 03/22/24 0725  WBC 18.7* 13.4* 14.1* 8.5  NEUTROABS 17.4* 12.9*  --   --   HGB 11.7* 10.0* 9.5* 10.1*  HCT 34.1* 29.3* 27.0* 29.4*  MCV 104.0* 102.4* 101.9* 103.2*  PLT 185 156 192 169   Basic Metabolic Panel: Recent Labs  Lab 03/19/24 1720 03/20/24 0533 03/21/24 0248 03/22/24 0725  NA 134* 137 146* 135  K 3.2* 3.0* 3.7 3.4*  CL 97* 100 109 107  CO2 23 19* 22 22  GLUCOSE 138* 105* 108* 98  BUN 28* 31* 25* 18  CREATININE 1.91* 1.58* 1.02 1.03  CALCIUM 9.6 9.2 9.4 8.9  MG  --   --  2.1 1.8   Liver Function Tests: Recent Labs  Lab 03/19/24 1720 03/20/24 0533  AST 101* 83*  ALT 34 29  ALKPHOS 59 58  BILITOT 1.8* 1.4*  PROT 6.6 5.5*  ALBUMIN 3.4* 2.7*   CBG: No results for input(s): GLUCAP in the last 168 hours.  Discharge time spent: greater than 30 minutes.  Author: Yetta Blanch, MD  [facility] 03/23/2024

## 2024-03-23 NOTE — Progress Notes (Signed)
 Mobility Specialist Progress Note:    03/23/24 1028  Mobility  Activity Ambulated with assistance (In hallway)  Level of Assistance Contact guard assist, steadying assist  Assistive Device Front wheel walker  Distance Ambulated (ft) 110 ft  Activity Response Tolerated well  Mobility Referral Yes  Mobility visit 1 Mobility  Mobility Specialist Start Time (ACUTE ONLY) [id number]  Mobility Specialist Stop Time (ACUTE ONLY) 0850  Mobility Specialist Time Calculation (min) (ACUTE ONLY) 12 min   Received pt in bed and agreeable to mobility. No physical assistance needed. C/o RLE pain, otherwise tolerated well. Returned pt to room without fault. Left pt in bed with alarm on. Personal belongings and call light within reach. All needs met.  Lavanda Pollack Mobility Specialist  Please contact via [employer] or  Rehab Office [phone]

## 2024-03-24 ENCOUNTER — Telehealth: Payer: Self-pay

## 2024-03-24 NOTE — Transitions of Care (Post Inpatient/ED Visit) (Signed)
   03/24/2024  Name: SHAWN CARATTINI MRN: [id number] DOB: January 31, 1933  Today's TOC FU Call Status: Today's TOC FU Call Status:: Unsuccessful Call (1st Attempt) Unsuccessful Call (1st Attempt) Date: 03/24/24  Attempted to reach the patient regarding the most recent Inpatient/ED visit.  Follow Up Plan: Additional outreach attempts will be made to reach the patient to complete the Transitions of Care (Post Inpatient/ED visit) call.   Signature  Charmaine Bloodgood, LPN [facility] Health Advisor [place] [facility] You Are. We Are. [facility] Direct Dial [phone]

## 2024-03-28 ENCOUNTER — Ambulatory Visit: Admitting: Family Medicine

## 2024-03-28 VITALS — BP 122/58 | HR 89 | Temp 98.2°F | Ht 66.0 in | Wt 154.2 lb

## 2024-03-28 DIAGNOSIS — L03221 Cellulitis of neck: Secondary | ICD-10-CM | POA: Diagnosis not present

## 2024-03-28 DIAGNOSIS — M6008 Infective myositis, other site: Secondary | ICD-10-CM | POA: Diagnosis not present

## 2024-03-28 DIAGNOSIS — I10 Essential (primary) hypertension: Secondary | ICD-10-CM

## 2024-03-28 DIAGNOSIS — T148XXA Other injury of unspecified body region, initial encounter: Secondary | ICD-10-CM | POA: Insufficient documentation

## 2024-03-28 DIAGNOSIS — M5430 Sciatica, unspecified side: Secondary | ICD-10-CM

## 2024-03-28 DIAGNOSIS — N179 Acute kidney failure, unspecified: Secondary | ICD-10-CM

## 2024-03-28 DIAGNOSIS — R296 Repeated falls: Secondary | ICD-10-CM

## 2024-03-28 DIAGNOSIS — M609 Myositis, unspecified: Secondary | ICD-10-CM | POA: Insufficient documentation

## 2024-03-28 NOTE — Assessment & Plan Note (Addendum)
 Antihypertensives were discontinued at recent hospitalization due to hypotension in setting of sepsis.  BP remains stable off antihypertensive medication. Will continue to hold metoprolol  and maxzide at this time, monitoring BP closely.

## 2024-03-28 NOTE — Patient Instructions (Addendum)
 Continue daily dressing changes at home to all skin tears and neck.  Return Wednesday 04/09/2024   at 9am for follow up visit

## 2024-03-28 NOTE — Progress Notes (Signed)
 Ph: [phone] Fax: [phone]   Patient ID: [name], male    DOB: 1933-02-22, 89 y.o.   MRN: [id number]  This visit was conducted in person.  BP (!) 122/58   Pulse 89   Temp 98.2 F (36.8 C) (Oral)   Ht 5' 6 (1.676 m)   Wt 154 lb 4 oz (70 kg)   SpO2 99%   BMI 24.90 kg/m    CC: hosp f/u visit  Subjective:   HPI: [name] is a 89 y.o. male presenting on 03/28/2024 for Hospitalization Follow-up (Pt accompanied by daughter [name]. Pt here for [facility] F/U. Admitted 8/20 and DC 8/24. Pt has wounds on his Rt Leg Left arm and Rt of his neck.)   Recent hospitalization for cellulitis to left neck wound complicated by sternocleidomastoid myositis sustained after fall ~03/12/2024. Latest fall 03/19/2024 - fell out of bed at 2am.   Hospital records reviewed. Med rec performed.  Metoprolol  and maxzide were stopped.  No drainable abscess on imaging.  Initial treatment with IV antibiotics. Blood cultures returned negative.  Also incidentally found L elbow cellulitis that did improve prior to discharge.   He also has skin tear of left forearm as well as to right lower leg which were sustained on previous falls to above fall.   Has had several falls recently.  Attributes falls to bilateral sciatica - limited walking due to this.  Saw Dr [name] s/p steroid injection.   Home health not set up - pt declined.  Other follow up appointments scheduled: saw Dr [name] ENT yesterday - rec apply abx ointment twice daily, f/u if not improving. On augmentin , doxycycline  antibiotics ______________________________________________________________________ Hospital admission: 03/19/2024 Hospital discharge: 03/23/2024  TCM f/u phone call:  attempted on 03/24/2024 x1  Recommendations at discharge: Follow-up with PCP in 1 week. Follow-up with ENT   Discharge Diagnoses: Principal Problem:   Cellulitis Active Problems:   HTN (hypertension)   COPD (chronic obstructive pulmonary disease)  (HCC)   Macrocytic anemia   ARF (acute renal failure) (HCC)   Hyponatremia   Hypokalemia     Relevant past medical, surgical, family and social history reviewed and updated as indicated. Interim medical history since our last visit reviewed. Allergies and medications reviewed and updated. Outpatient Medications Prior to Visit  Medication Sig Dispense Refill   amoxicillin -clavulanate (AUGMENTIN ) 875-125 MG tablet Take 1 tablet by mouth 2 (two) times daily for 7 days. 14 tablet 0   B Complex-C (B-COMPLEX WITH VITAMIN C) tablet Take 1 tablet by mouth daily. 30 tablet 0   Coenzyme Q10 (COQ-10) 50 MG CAPS Take by mouth daily.     doxycycline  (VIBRA -TABS) 100 MG tablet Take 1 tablet (100 mg total) by mouth every 12 (twelve) hours for 7 days. 14 tablet 0   Ensure Max Protein (ENSURE MAX PROTEIN) LIQD Take 330 mLs (11 oz total) by mouth daily. 10000 mL 0   [START ON 03/30/2024] ferrous sulfate  325 (65 FE) MG EC tablet Take 1 tablet (325 mg total) by mouth daily with breakfast. 60 tablet 0   folic acid  (FOLVITE ) 1 MG tablet Take 1 tablet (1 mg total) by mouth daily. 30 tablet 0   Ginkgo Biloba 40 MG TABS Take 2 tablets (80 mg total) by mouth daily.     melatonin 3 MG TABS tablet Take 3 mg by mouth at bedtime.     Multiple Vitamins-Minerals (PRESERVISION AREDS 2) CAPS Take 1 capsule by mouth daily.     polyethylene glycol (  MIRALAX  / GLYCOLAX ) 17 g packet Take 17 g by mouth daily. 14 each 0   Potassium 99 MG TABS Take 1 tablet (99 mg total) by mouth every Monday, Wednesday, and Friday.     Wound Dressings ([employer] FOAM ADHESIVE DRESSING) PADS Apply 2 each topically daily at 6 (six) AM. 60 each 0   Zn-Pyg Afri-Nettle-Saw Palmet (SAW PALMETTO COMPLEX PO) Take 250 mg by mouth every other day.      metoprolol  tartrate (LOPRESSOR ) 25 MG tablet take 0.5 tablets by mouth 2 times daily.     Multiple Vitamin (MULTIVITAMIN) tablet Take 1 tablet by mouth daily.     triamterene -hydrochlorothiazide (MAXZIDE-25)  37.5-25 MG tablet Take 1 tablet by mouth every other day.     Bismuth Tribromoph-Petrolatum (XEROFORM PETROLAT PATCH 2X2) PADS Apply 4 each topically daily at 6 (six) AM. (Patient not taking: Reported on 03/28/2024) 100 each 0   ibuprofen (ADVIL) 200 MG tablet Take 200 mg by mouth every 6 (six) hours as needed for mild pain (pain score 1-3) (Leg pain). (Patient not taking: Reported on 03/28/2024)     nutrition supplement, JUVEN, (JUVEN) PACK Take 1 packet by mouth 2 (two) times daily between meals. (Patient not taking: Reported on 03/28/2024) 60 each 0   No facility-administered medications prior to visit.     Per HPI unless specifically indicated in ROS section below Review of Systems  Objective:  BP (!) 122/58   Pulse 89   Temp 98.2 F (36.8 C) (Oral)   Ht 5' 6 (1.676 m)   Wt 154 lb 4 oz (70 kg)   SpO2 99%   BMI 24.90 kg/m   Wt Readings from Last 3 Encounters:  03/28/24 154 lb 4 oz (70 kg)  03/19/24 148 lb (67.1 kg)  06/18/23 157 lb 8 oz (71.4 kg)      Physical Exam Vitals and nursing note reviewed.  Constitutional:      Appearance: Normal appearance. He is not ill-appearing.  HENT:     Mouth/Throat:     Mouth: Mucous membranes are moist.     Pharynx: Oropharynx is clear. No oropharyngeal exudate or posterior oropharyngeal erythema.  Eyes:     Extraocular Movements: Extraocular movements intact.     Pupils: Pupils are equal, round, and reactive to light.  Neck:      Comments: Left lateral neck with large area of denuded skin extending from posterior auricular region down SCM to left base of neck into clavicle with some granulation tissue present, without erythema or drainage present Cardiovascular:     Rate and Rhythm: Normal rate and regular rhythm.     Pulses: Normal pulses.     Heart sounds: Normal heart sounds. No murmur heard. Pulmonary:     Effort: Pulmonary effort is normal. No respiratory distress.     Breath sounds: Normal breath sounds. No wheezing, rhonchi  or rales.  Musculoskeletal:        General: Signs of injury present.     Right lower leg: No edema.     Left lower leg: No edema.  Skin:    General: Skin is warm and dry.     Findings: Wound present. No erythema or rash.         Comments: Areas of denuded skin to left forearm as well as right anterior shin without erythema/drainage  Neurological:     Mental Status: He is alert.  Psychiatric:        Mood and Affect: Mood normal.  Behavior: Behavior normal.    All wounds cleaned with normal saline syringes, then extremity wounds dressed with triple abx ointment and covered in non-stick gauze, kerlix and coban wrap, neck wound dressed with bacitracin, non stick gauze, and kerlix.     Lab Results  Component Value Date   NA 135 03/22/2024   CL 107 03/22/2024   K 3.4 (L) 03/22/2024   CO2 22 03/22/2024   BUN 18 03/22/2024   CREATININE 1.03 03/22/2024   GFRNONAA >60 03/22/2024   CALCIUM 8.9 03/22/2024   PHOS 2.4 01/23/2009   ALBUMIN 2.7 (L) 03/20/2024   GLUCOSE 98 03/22/2024    Lab Results  Component Value Date   WBC 8.5 03/22/2024   HGB 10.1 (L) 03/22/2024   HCT 29.4 (L) 03/22/2024   MCV 103.2 (H) 03/22/2024   PLT 169 03/22/2024    Assessment & Plan:   Problem List Items Addressed This Visit     HTN (hypertension)   Antihypertensives were discontinued at recent hospitalization due to hypotension in setting of sepsis.  BP remains stable off antihypertensive medication. Will continue to hold metoprolol  and maxzide at this time, monitoring BP closely.      Recurrent falls   Anticipate multifactorial - hypotension from sepsis, sciatica, advanced age with unsteadiness on feet.  Previous alcohol use - but stopped drinking 2022 - will need to verify remains abstinent.  He declined HHPT.       Chronic sciatica   Has been seeing ortho Dr [name] for bilateral sciatica, s/p recent steroid injection. This likely contributed to recurrent falls.       Cellulitis   This  is improving on current regimen of augmentin  and doxycycline , to finish course on Sunday.  Saw ENT, wound care instructions reviewed.  RTC 10d for wound check.       ARF (acute renal failure) (HCC)   Will update labs next week at f/u visit       Myositis - Primary   Also improving with treatment - has FROM at neck without significant discomfort.       Multiple skin tears   Multiple skin tears due to recurrent falls.  Wounds cleaned and dressed, home instructions provided.         No orders of the defined types were placed in this encounter.   No orders of the defined types were placed in this encounter.   Patient Instructions  Continue daily dressing changes at home to all skin tears and neck.  Return Wednesday 04/09/2024   at 9am for follow up visit  Follow up plan: No follow-ups on file.  Anton Blas, MD

## 2024-03-28 NOTE — Assessment & Plan Note (Addendum)
 This is improving on current regimen of augmentin  and doxycycline , to finish course on Sunday.  Saw ENT, wound care instructions reviewed.  RTC 10d for wound check.

## 2024-03-28 NOTE — Assessment & Plan Note (Signed)
 Also improving with treatment - has FROM at neck without significant discomfort.

## 2024-03-29 ENCOUNTER — Encounter: Payer: Self-pay | Admitting: Family Medicine

## 2024-03-29 NOTE — Assessment & Plan Note (Signed)
 Will update labs next week at f/u visit

## 2024-03-29 NOTE — Assessment & Plan Note (Signed)
 Has been seeing ortho Dr Josefina for bilateral sciatica, s/p recent steroid injection. This likely contributed to recurrent falls.

## 2024-03-29 NOTE — Assessment & Plan Note (Signed)
 Multiple skin tears due to recurrent falls.  Wounds cleaned and dressed, home instructions provided.

## 2024-03-29 NOTE — Assessment & Plan Note (Addendum)
 Anticipate multifactorial - hypotension from sepsis, sciatica, advanced age with unsteadiness on feet.  Previous alcohol use - but stopped drinking 2022 - will need to verify remains abstinent.  He declined HHPT.

## 2024-04-09 ENCOUNTER — Encounter: Payer: Self-pay | Admitting: Family Medicine

## 2024-04-09 ENCOUNTER — Ambulatory Visit: Admitting: Family Medicine

## 2024-04-09 VITALS — BP 112/68 | HR 79 | Temp 97.9°F | Ht 66.0 in | Wt 149.2 lb

## 2024-04-09 DIAGNOSIS — N289 Disorder of kidney and ureter, unspecified: Secondary | ICD-10-CM | POA: Diagnosis not present

## 2024-04-09 DIAGNOSIS — T148XXA Other injury of unspecified body region, initial encounter: Secondary | ICD-10-CM | POA: Diagnosis not present

## 2024-04-09 DIAGNOSIS — L03221 Cellulitis of neck: Secondary | ICD-10-CM

## 2024-04-09 LAB — BASIC METABOLIC PANEL WITH GFR
BUN: 14 mg/dL (ref 6–23)
CO2: 28 meq/L (ref 19–32)
Calcium: 9.8 mg/dL (ref 8.4–10.5)
Chloride: 100 meq/L (ref 96–112)
Creatinine, Ser: 1.03 mg/dL (ref 0.40–1.50)
GFR: 64.67 mL/min (ref >60.00)
Glucose, Bld: 104 mg/dL — ABNORMAL HIGH (ref 70–99)
Potassium: 3.9 meq/L (ref 3.5–5.1)
Sodium: 137 meq/L (ref 135–145)

## 2024-04-09 LAB — CBC WITH DIFFERENTIAL/PLATELET
Basophils Absolute: 0 K/uL (ref 0.0–0.1)
Basophils Relative: 0.5 % (ref 0.0–3.0)
Eosinophils Absolute: 0.1 K/uL (ref 0.0–0.7)
Eosinophils Relative: 1.8 % (ref 0.0–5.0)
HCT: 32.2 % — ABNORMAL LOW (ref 39.0–52.0)
Hemoglobin: 10.7 g/dL — ABNORMAL LOW (ref 13.0–17.0)
Lymphocytes Relative: 32.7 % (ref 12.0–46.0)
Lymphs Abs: 1.4 K/uL (ref 0.7–4.0)
MCHC: 33.4 g/dL (ref 30.0–36.0)
MCV: 105.6 fl — ABNORMAL HIGH (ref 78.0–100.0)
Monocytes Absolute: 0.6 K/uL (ref 0.1–1.0)
Monocytes Relative: 13.7 % — ABNORMAL HIGH (ref 3.0–12.0)
Neutro Abs: 2.2 K/uL (ref 1.4–7.7)
Neutrophils Relative %: 51.3 % (ref 43.0–77.0)
Platelets: 199 K/uL (ref 150.0–400.0)
RBC: 3.05 Mil/uL — ABNORMAL LOW (ref 4.22–5.81)
RDW: 15.3 % (ref 11.5–15.5)
WBC: 4.4 K/uL (ref 4.0–10.5)

## 2024-04-09 NOTE — Progress Notes (Signed)
 Ph: [phone] Fax: [phone]   Patient ID: [name], male    DOB: 01-May-1933, 89 y.o.   MRN: [id number]  This visit was conducted in person.  BP 112/68   Pulse 79   Temp 97.9 F (36.6 C) (Oral)   Ht 5' 6 (1.676 m)   Wt 149 lb 4 oz (67.7 kg)   SpO2 99%   BMI 24.09 kg/m    CC: recheck neck wound - 1 wk f/u  Subjective:   HPI: [name] is a 89 y.o. male presenting on 04/09/2024 for Medical Management of Chronic Issues (Pt here fo wound  F/U. Pt states wounds are getting better./Pt accompanied by [name] (caregiver))   See prior note for details.  L forearm skin tear and R lower leg skin tears are much better, with residual scabbing and surrounding dry skin.   BP staying normal off antihypertensives (metoprolol  and maxzide).   L neck wound - finished antibiotics (augmentin , doxycycline ), doing dressing changes daily with topical abx (mupirocin).  10cm x 2cm wound to left neck.     Relevant past medical, surgical, family and social history reviewed and updated as indicated. Interim medical history since our last visit reviewed. Allergies and medications reviewed and updated. Outpatient Medications Prior to Visit  Medication Sig Dispense Refill   B Complex-C (B-COMPLEX WITH VITAMIN C) tablet Take 1 tablet by mouth daily. 30 tablet 0   Coenzyme Q10 (COQ-10) 50 MG CAPS Take by mouth daily.     Ensure Max Protein (ENSURE MAX PROTEIN) LIQD Take 330 mLs (11 oz total) by mouth daily. 10000 mL 0   ferrous sulfate  325 (65 FE) MG EC tablet Take 1 tablet (325 mg total) by mouth daily with breakfast. 60 tablet 0   folic acid  (FOLVITE ) 1 MG tablet Take 1 tablet (1 mg total) by mouth daily. 30 tablet 0   Ginkgo Biloba 40 MG TABS Take 2 tablets (80 mg total) by mouth daily.     melatonin 3 MG TABS tablet Take 3 mg by mouth at bedtime.     Multiple Vitamins-Minerals (PRESERVISION AREDS 2) CAPS Take 1 capsule by mouth daily.     polyethylene glycol (MIRALAX  / GLYCOLAX )  17 g packet Take 17 g by mouth daily. 14 each 0   Potassium 99 MG TABS Take 1 tablet (99 mg total) by mouth every Monday, Wednesday, and Friday.     Wound Dressings ([employer] FOAM ADHESIVE DRESSING) PADS Apply 2 each topically daily at 6 (six) AM. 60 each 0   Zn-Pyg Afri-Nettle-Saw Palmet (SAW PALMETTO COMPLEX PO) Take 250 mg by mouth every other day.      No facility-administered medications prior to visit.     Per HPI unless specifically indicated in ROS section below Review of Systems  Objective:  BP 112/68   Pulse 79   Temp 97.9 F (36.6 C) (Oral)   Ht 5' 6 (1.676 m)   Wt 149 lb 4 oz (67.7 kg)   SpO2 99%   BMI 24.09 kg/m   Wt Readings from Last 3 Encounters:  04/09/24 149 lb 4 oz (67.7 kg)  03/28/24 154 lb 4 oz (70 kg)  03/19/24 148 lb (67.1 kg)      Physical Exam Vitals and nursing note reviewed.  Constitutional:      Appearance: Normal appearance. He is not ill-appearing.  Musculoskeletal:        General: Signs of injury present.     Right lower leg: No edema.  Left lower leg: No edema.  Skin:    General: Skin is warm.     Findings: Wound present.     Comments:  R anterior shin - scab present with surrounding dry skin  L forearm - scab present with surrounding dry skin  L lateral neck - denuded skin without surrounding erythema or drainage, decreasing area of wound - 10x2cm wound to left neck  Neurological:     Mental Status: He is alert.        Wound cleaned with normal saline, dressed with triple antibiotic ointment, covered with vaseline gauze, kerlix and ace wrap. Pt tolerated well.      Assessment & Plan:   Problem List Items Addressed This Visit     Renal insufficiency   Update renal panel, CBC.       Relevant Orders   CBC with Differential/Platelet   Basic metabolic panel with GFR   Cellulitis - Primary   L neck cellulitis with large residual skin wound to left neck.  This is improving - continue treatment to date with normal saline  rinse, followed by topical mupirocin (has at home) and vaseline gauze daily dressing changes.  Red flags to seek care reviewed - streaking redness, draining pus, fevers.       Relevant Orders   CBC with Differential/Platelet   Basic metabolic panel with GFR   Multiple skin tears   To extremities - are healing well         No orders of the defined types were placed in this encounter.   Orders Placed This Encounter  Procedures   CBC with Differential/Platelet   Basic metabolic panel with GFR    Patient Instructions  Wound is looking great - smaller. Continue daily dressing changes with antibiotic ointment, vaseline gauze and wrap as up to now.  Labs today. Stay off of blood pressure medicine Return after 06/17/2024 for physical.   Follow up plan: No follow-ups on file.  Kevin Blas, MD

## 2024-04-09 NOTE — Assessment & Plan Note (Signed)
 L neck cellulitis with large residual skin wound to left neck.  This is improving - continue treatment to date with normal saline rinse, followed by topical mupirocin (has at home) and vaseline gauze daily dressing changes.  Red flags to seek care reviewed - streaking redness, draining pus, fevers.

## 2024-04-09 NOTE — Patient Instructions (Addendum)
 Wound is looking great - smaller. Continue daily dressing changes with antibiotic ointment, vaseline gauze and wrap as up to now.  Labs today. Stay off of blood pressure medicine Return after 06/17/2024 for physical.

## 2024-04-09 NOTE — Assessment & Plan Note (Signed)
Update renal panel, CBC.

## 2024-04-09 NOTE — Assessment & Plan Note (Signed)
 To extremities - are healing well

## 2024-04-10 ENCOUNTER — Ambulatory Visit: Payer: Self-pay | Admitting: Family Medicine

## 2024-04-10 NOTE — Telephone Encounter (Signed)
 Copied from CRM #[id number]. Topic: Clinical - Lab/Test Results >> Apr 10, 2024  2:11 PM Ivette P wrote: Reason for CRM: Pt called in due to miss lab call, advised labs as follows:    [name], MD to [facility]     04/10/24 10:01 AM Result Note Plz notify he remains anemic but overall improved from last check.  Kidney function was normal, electrolytes also normal including potassium levels.  Overall reassuring labwork.    Pt verbalized understanding

## 2024-05-28 ENCOUNTER — Ambulatory Visit: Payer: Medicare Other

## 2024-05-28 VITALS — Ht 66.0 in | Wt 144.0 lb

## 2024-05-28 DIAGNOSIS — Z0000 Encounter for general adult medical examination without abnormal findings: Secondary | ICD-10-CM

## 2024-05-28 NOTE — Progress Notes (Signed)
 Please attest and cosign this visit due to patients primary care provider not being in the office at the time the visit was completed.     Subjective:   [name] is a 89 y.o. who presents for a Medicare Wellness preventive visit.  As a reminder, Annual Wellness Visits don't include a physical exam, and some assessments may be limited, especially if this visit is performed virtually. We may recommend an in-person follow-up visit with your provider if needed.  Visit Complete: Virtual I connected with  [name] on 05/28/24 by a audio enabled telemedicine application and verified that I am speaking with the correct person using two identifiers.  Patient Location: Home  Provider Location: Office/Clinic  I discussed the limitations of evaluation and management by telemedicine. The patient expressed understanding and agreed to proceed.  Vital Signs: Because this visit was a virtual/telehealth visit, some criteria may be missing or patient reported. Any vitals not documented were not able to be obtained and vitals that have been documented are patient reported.  VideoDeclined- This patient declined Librarian, academic. Therefore the visit was completed with audio only.  Persons Participating in Visit: Patient.  AWV Questionnaire: Yes: Patient Medicare AWV questionnaire was completed by the patient on 05/24/2024; I have confirmed that all information answered by patient is correct and no changes since this date.  Cardiac Risk Factors include: advanced age (>35men, >71 women);hypertension;male gender     Objective:    Today's Vitals   05/28/24 1429  Weight: 144 lb (65.3 kg)  Height: 5' 6 (1.676 m)   Body mass index is 23.24 kg/m.     05/28/2024    2:40 PM 03/20/2024    2:40 AM 03/20/2024    1:19 AM 05/28/2023    1:05 PM 01/07/2021    6:29 PM 03/12/2018    9:08 AM 02/21/2017    8:14 AM  Advanced Directives  Does Patient Have a Medical  Advance Directive? Yes  Yes Yes No Yes  Yes   Type of Estate Agent of [place];Living will Living will;Healthcare Power of [employer] Power of [place];Living will  Healthcare Power of [place];Living will Healthcare Power of [place];Living will  Does patient want to make changes to medical advance directive?  No - Patient declined       Copy of Healthcare Power of Attorney in Chart? Yes - validated most recent copy scanned in chart (See row information)   No - copy requested  No - copy requested  No - copy requested      Data saved with a previous flowsheet row definition    Current Medications (verified) Outpatient Encounter Medications as of 05/28/2024  Medication Sig   B Complex-C (B-COMPLEX WITH VITAMIN C) tablet Take 1 tablet by mouth daily.   Coenzyme Q10 (COQ-10) 50 MG CAPS Take by mouth daily.   ferrous sulfate  325 (65 FE) MG EC tablet Take 1 tablet (325 mg total) by mouth daily with breakfast.   folic acid  (FOLVITE ) 1 MG tablet Take 1 tablet (1 mg total) by mouth daily.   Ginkgo Biloba 40 MG TABS Take 2 tablets (80 mg total) by mouth daily.   melatonin 3 MG TABS tablet Take 3 mg by mouth at bedtime.   Multiple Vitamins-Minerals (PRESERVISION AREDS 2) CAPS Take 1 capsule by mouth daily.   polyethylene glycol (MIRALAX  / GLYCOLAX ) 17 g packet Take 17 g by mouth daily.   Potassium 99 MG TABS Take 1 tablet (  99 mg total) by mouth every Monday, Wednesday, and Friday.   Zn-Pyg Afri-Nettle-Saw Palmet (SAW PALMETTO COMPLEX PO) Take 250 mg by mouth every other day.    Ensure Max Protein (ENSURE MAX PROTEIN) LIQD Take 330 mLs (11 oz total) by mouth daily.   Wound Dressings ([employer] FOAM ADHESIVE DRESSING) PADS Apply 2 each topically daily at 6 (six) AM.   No facility-administered encounter medications on file as of 05/28/2024.    Allergies (verified) Sulfa antibiotics   History: Past Medical History:  Diagnosis Date   Arthritis 2020   Choledocholithiasis  08/01/2007   sepsis s/p ICU stay with VDRF   Chronic fungal otitis externa 07/31/2013   [facility])   Colon polyp    adenomatous - [facility]   COPD (chronic obstructive pulmonary disease) (HCC)    DDD (degenerative disc disease)    cervical and lumbar spine   Depression with anxiety    ED (erectile dysfunction)    on viagra    Elevated PSA 07/31/2012   s/p normal biopsy 2003, now followed by Dr. Nieves, monitoring   History of alcohol abuse 08/01/1987   after bad divorce   History of smoking 07/31/1998   50+ PY hx, s/p normal CT chest 2014   HTN (hypertension)    OSA (obstructive sleep apnea)    could not tolerate CPAP   Peyronie disease    Presbycusis of both ears 07/31/2013   rec updated binaural amplification   Wears hearing aid    [facility]   Past Surgical History:  Procedure Laterality Date   CARDIAC CATHETERIZATION  2009   WNL per records, [place]   CHOLECYSTECTOMY  2009   COLONOSCOPY  2009   adenomatous polyp, rec rpt 5 yrs ([facility])   COLONOSCOPY  07/2013   no polyps ([facility])   ECTROPION REPAIR Bilateral 04/2018   bilat lower eyelids   hospitalization  2010   ERCP with sphincertotomy and CBD stone removal - septic cholangiis with EtOH withdrawal, E coli bacteremia with septic shock, with arterial thromboembolization with discoloration of toes of R foot   rectal fistula repair  1984   REFRACTIVE SURGERY Right 03/2019   TONSILLECTOMY  1941   VASECTOMY  1969   Family History  Problem Relation Age of Onset   Cancer Father        throat?   Alcohol abuse Father    Stroke Maternal Aunt        hemorrhagic   Stroke Mother        ischemic   Diabetes Neg Hx    Social History   Socioeconomic History   Marital status: Married    Spouse name: Not on file   Number of children: Not on file   Years of education: Not on file   Highest education level: Not on file  Occupational History   Not on file  Tobacco Use   Smoking status: Former    Current packs/day:  0.00    Average packs/day: 1 pack/day for 50.0 years (50.0 ttl pk-yrs)    Types: Cigarettes    Start date: 07/31/1948    Quit date: 07/31/1998    Years since quitting: 25.8   Smokeless tobacco: Never  Vaping Use   Vaping status: Never Used  Substance and Sexual Activity   Alcohol use: Yes    Alcohol/week: 6.0 - 7.0 standard drinks of alcohol    Types: 6 - 7 Glasses of wine per week   Drug use: No   Sexual activity: Yes  Other  Topics Concern   Not on file  Social History Narrative   Lives with Rock wife (married since 2005)   Occupation: Medical Sales Representative, retired   Edu: 18+ yrs   Activity: walking regularly, but not as much as he would like to - wife has trouble keeping up.   Diet: good water, fruits/vegetables daily. Significant sweet tea.   Social Drivers of Corporate Investment Banker Strain: Low Risk  (05/28/2024)   Overall Financial Resource Strain (CARDIA)    Difficulty of Paying Living Expenses: Not hard at all  Food Insecurity: No Food Insecurity (05/28/2024)   Hunger Vital Sign    Worried About Running Out of Food in the Last Year: Never true    Ran Out of Food in the Last Year: Never true  Transportation Needs: No Transportation Needs (05/28/2024)   PRAPARE - Administrator, Civil Service (Medical): No    Lack of Transportation (Non-Medical): No  Physical Activity: Sufficiently Active (05/28/2023)   Exercise Vital Sign    Days of Exercise per Week: 5 days    Minutes of Exercise per Session: 30 min  Stress: No Stress Concern Present (05/28/2024)   [employer] of Occupational Health - Occupational Stress Questionnaire    Feeling of Stress: Not at all  Social Connections: Moderately Isolated (05/28/2024)   Social Connection and Isolation Panel    Frequency of Communication with Friends and Family: Three times a week    Frequency of Social Gatherings with Friends and Family: Three times a week    Attends Religious Services: Never    Active Member of  Clubs or Organizations: No    Attends Banker Meetings: Never    Marital Status: Married    Tobacco Counseling Counseling given: Not Answered    Clinical Intake:  Pre-visit preparation completed: Yes  Pain : No/denies pain     BMI - recorded: 23.24 Nutritional Status: BMI of 19-24  Normal Nutritional Risks: None Diabetes: No  Lab Results  Component Value Date   HGBA1C 5.5 04/19/2020   HGBA1C 5.4 02/19/2017     How often do you need to have someone help you when you read instructions, pamphlets, or other written materials from your doctor or pharmacy?: 1 - Never  Interpreter Needed?: No  Information entered by :: B.Deleah Tison,LPN   Activities of Daily Living     05/24/2024   11:22 AM 03/20/2024    2:42 AM  In your present state of health, do you have any difficulty performing the following activities:  Hearing? 1   Vision? 0   Difficulty concentrating or making decisions? 0   Walking or climbing stairs? 1   Dressing or bathing? 0   Doing errands, shopping? 0 0  Preparing Food and eating ? N   Using the Toilet? N   In the past six months, have you accidently leaked urine? N   Do you have problems with loss of bowel control? N   Managing your Medications? N   Managing your Finances? N   Housekeeping or managing your Housekeeping? N     Patient Care Team: [name], MD as PCP - General (Family Medicine) Lysle Carlin Kitty, DDS (Dentistry) [facility]  I have updated your Care Teams any recent Medical Services you may have received from other providers in the past year.     Assessment:   This is a routine wellness examination for [name].  Hearing/Vision screen Hearing Screening - Comments:: Patient denies any hearing  difficulties w/hearing aids Vision Screening - Comments:: Pt says their vision is good with glasses Dr  Kevin Gaudy   Goals Addressed               This Visit's Progress     COMPLETED:  Increase physical activity (pt-stated)        Starting 03/12/2018, I will continue to walk at least 30 min 7 days per week.        Depression Screen     05/28/2024    2:35 PM 04/09/2024    8:54 AM 03/28/2024   11:35 AM 05/28/2023    1:04 PM 04/22/2021    9:23 AM 04/19/2020   12:07 PM 03/19/2019   12:49 PM  PHQ 2/9 Scores  PHQ - 2 Score 0 0 1 0 1 0 0  PHQ- 9 Score  2 2  4       Fall Risk     05/24/2024   11:22 AM 04/09/2024    8:54 AM 03/28/2024   11:35 AM 05/28/2023    1:03 PM 05/03/2022   11:11 AM  Fall Risk   Falls in the past year? 1 1 1  0 0  Number falls in past yr: 1 1 1  0   Injury with Fall? 1 1 1  0   Risk for fall due to : Orthopedic patient;Impaired balance/gait;Impaired mobility Other (Comment) Other (Comment) No Fall Risks   Follow up Education provided;Falls prevention discussed Falls evaluation completed Falls evaluation completed Falls prevention discussed     MEDICARE RISK AT HOME:  Medicare Risk at Home Any stairs in or around the home?: (Patient-Rptd) No If so, are there any without handrails?: (Patient-Rptd) Yes Home free of loose throw rugs in walkways, pet beds, electrical cords, etc?: (Patient-Rptd) Yes Adequate lighting in your home to reduce risk of falls?: (Patient-Rptd) Yes Life alert?: (Patient-Rptd) No Use of a cane, walker or w/c?: (Patient-Rptd) Yes Grab bars in the bathroom?: (Patient-Rptd) Yes Shower chair or bench in shower?: (Patient-Rptd) Yes Elevated toilet seat or a handicapped toilet?: (Patient-Rptd) No  TIMED UP AND GO:  Was the test performed?  No  Cognitive Function: 6CIT completed    03/12/2018    9:09 AM 02/21/2017    8:16 AM 02/15/2016    2:38 PM  MMSE - Mini Mental State Exam  Orientation to time 5 5  5    Orientation to Place 5 5  5    Registration 3 3  3    Attention/ Calculation 0 0  0   Recall 3 2  3    Language- name 2 objects 0 0  0   Language- repeat 1 1 1   Language- follow 3 step command 3 3  3    Language- read &  follow direction 0 0  0   Write a sentence 0 0  0   Copy design 0 0  0   Total score 20 19  20       Data saved with a previous flowsheet row definition        05/28/2024    2:41 PM 05/28/2023    1:06 PM  6CIT Screen  What Year? 0 points 0 points  What month? 0 points 0 points  What time? 0 points 0 points  Count back from 20 0 points 0 points  Months in reverse 0 points 0 points  Repeat phrase 0 points 0 points  Total Score 0 points 0 points    Immunizations Immunization History  Administered Date(s) Administered   Fluad  Quad(high Dose 65+) 04/01/2022   Hepatitis A 04/14/2003, 10/07/2003   INFLUENZA, HIGH DOSE SEASONAL PF 04/08/2014, 03/27/2015, 06/04/2017, 04/14/2020, 04/20/2021, 03/26/2023   Influenza Whole 04/14/2013   Influenza,inj,Quad PF,6+ Mos 04/16/2018, 03/19/2019   Influenza-Unspecified 03/20/2016, 04/01/2022   PFIZER Comirnaty(Gray Top)Covid-19 Tri-Sucrose Vaccine 10/28/2020, 04/20/2022   PFIZER(Purple Top)SARS-COV-2 Vaccination 08/19/2019, 09/12/2019, 04/20/2021   Pfizer Covid-19 Vaccine Bivalent Booster 30yrs & up 12/07/2021   Pneumococcal Conjugate-13 02/10/2014   Pneumococcal Polysaccharide-23 08/06/2002   Td 04/14/2003   Tdap 12/13/2010, 01/07/2021   Zoster, Live 12/03/2006    Screening Tests Health Maintenance  Topic Date Due   Zoster Vaccines- Shingrix (1 of 2) 05/07/1985   COVID-19 Vaccine (7 - 2025-26 season) 03/31/2024   Medicare Annual Wellness (AWV)  05/28/2025   DTaP/Tdap/Td (4 - Td or Tdap) 01/08/2031   Pneumococcal Vaccine: 50+ Years  Completed   Influenza Vaccine  Completed   Meningococcal B Vaccine  Aged Out    Health Maintenance Items Addressed: None at this time needed  Additional Screening:  Vision Screening: Recommended annual ophthalmology exams for early detection of glaucoma and other disorders of the eye. Is the patient up to date with their annual eye exam?  Yes  Who is the provider or what is the name of the office in  which the patient attends annual eye exams? Dr JONELLE Gaudy  Dental Screening: Recommended annual dental exams for proper oral hygiene  Community Resource Referral / Chronic Care Management: CRR required this visit?  No   CCM required this visit?  No   Plan:    I have personally reviewed and noted the following in the patient's chart:   Medical and social history Use of alcohol, tobacco or illicit drugs  Current medications and supplements including opioid prescriptions. Patient is not currently taking opioid prescriptions. Functional ability and status Nutritional status Physical activity Advanced directives List of other physicians Hospitalizations, surgeries, and ER visits in previous 12 months Vitals Screenings to include cognitive, depression, and falls Referrals and appointments  In addition, I have reviewed and discussed with patient certain preventive protocols, quality metrics, and best practice recommendations. A written personalized care plan for preventive services as well as general preventive health recommendations were provided to patient.   Kevin LITTIE Saris, LPN   [id number]   After Visit Summary: (MyChart) Due to this being a telephonic visit, the after visit summary with patients personalized plan was offered to patient via MyChart   Notes: Nothing significant to report at this time.

## 2024-05-28 NOTE — Patient Instructions (Signed)
 [name],  Thank you for taking the time for your Medicare Wellness Visit. I appreciate your continued commitment to your health goals. Please review the care plan we discussed, and feel free to reach out if I can assist you further.  Medicare recommends these wellness visits once per year to help you and your care team stay ahead of potential health issues. These visits are designed to focus on prevention, allowing your provider to concentrate on managing your acute and chronic conditions during your regular appointments.  Please note that Annual Wellness Visits do not include a physical exam. Some assessments may be limited, especially if the visit was conducted virtually. If needed, we may recommend a separate in-person follow-up with your provider.  Ongoing Care Seeing your primary care provider every 3 to 6 months helps us  monitor your health and provide consistent, personalized care.   Referrals If a referral was made during today's visit and you haven't received any updates within two weeks, please contact the referred provider directly to check on the status.  Recommended Screenings:  Health Maintenance  Topic Date Due   Zoster (Shingles) Vaccine (1 of 2) 05/07/1985   COVID-19 Vaccine (7 - 2025-26 season) 03/31/2024   Medicare Annual Wellness Visit  05/28/2025   DTaP/Tdap/Td vaccine (4 - Td or Tdap) 01/08/2031   Pneumococcal Vaccine for age over 27  Completed   Flu Shot  Completed   Meningitis B Vaccine  Aged Out       03/20/2024    2:40 AM  Advanced Directives  Type of Advance Directive Living will;Healthcare Power of Attorney  Does patient want to make changes to medical advance directive? No - Patient declined   Advance Care Planning is important because it: Ensures you receive medical care that aligns with your values, goals, and preferences. Provides guidance to your family and loved ones, reducing the emotional burden of decision-making during critical  moments.  Vision: Annual vision screenings are recommended for early detection of glaucoma, cataracts, and diabetic retinopathy. These exams can also reveal signs of chronic conditions such as diabetes and high blood pressure.  Dental: Annual dental screenings help detect early signs of oral cancer, gum disease, and other conditions linked to overall health, including heart disease and diabetes.

## 2024-06-20 ENCOUNTER — Encounter: Payer: Self-pay | Admitting: Family Medicine

## 2024-06-20 ENCOUNTER — Ambulatory Visit: Admitting: Family Medicine

## 2024-06-20 VITALS — BP 122/60 | HR 78 | Temp 98.0°F | Ht 65.0 in | Wt 149.5 lb

## 2024-06-20 DIAGNOSIS — Z0000 Encounter for general adult medical examination without abnormal findings: Secondary | ICD-10-CM | POA: Diagnosis not present

## 2024-06-20 DIAGNOSIS — Z87898 Personal history of other specified conditions: Secondary | ICD-10-CM

## 2024-06-20 DIAGNOSIS — J449 Chronic obstructive pulmonary disease, unspecified: Secondary | ICD-10-CM | POA: Diagnosis not present

## 2024-06-20 DIAGNOSIS — Z7189 Other specified counseling: Secondary | ICD-10-CM

## 2024-06-20 DIAGNOSIS — D539 Nutritional anemia, unspecified: Secondary | ICD-10-CM

## 2024-06-20 DIAGNOSIS — R319 Hematuria, unspecified: Secondary | ICD-10-CM

## 2024-06-20 DIAGNOSIS — Z87891 Personal history of nicotine dependence: Secondary | ICD-10-CM

## 2024-06-20 DIAGNOSIS — Z8679 Personal history of other diseases of the circulatory system: Secondary | ICD-10-CM

## 2024-06-20 LAB — CBC WITH DIFFERENTIAL/PLATELET
Basophils Absolute: 0 K/uL (ref 0.0–0.1)
Basophils Relative: 0.4 % (ref 0.0–3.0)
Eosinophils Absolute: 0 K/uL (ref 0.0–0.7)
Eosinophils Relative: 0.7 % (ref 0.0–5.0)
HCT: 32.9 % — ABNORMAL LOW (ref 39.0–52.0)
Hemoglobin: 11.1 g/dL — ABNORMAL LOW (ref 13.0–17.0)
Lymphocytes Relative: 25.7 % (ref 12.0–46.0)
Lymphs Abs: 1.6 K/uL (ref 0.7–4.0)
MCHC: 33.7 g/dL (ref 30.0–36.0)
MCV: 106.8 fl — ABNORMAL HIGH (ref 78.0–100.0)
Monocytes Absolute: 0.6 K/uL (ref 0.1–1.0)
Monocytes Relative: 9.7 % (ref 3.0–12.0)
Neutro Abs: 4 K/uL (ref 1.4–7.7)
Neutrophils Relative %: 63.5 % (ref 43.0–77.0)
Platelets: 196 K/uL (ref 150.0–400.0)
RBC: 3.08 Mil/uL — ABNORMAL LOW (ref 4.22–5.81)
RDW: 14.4 % (ref 11.5–15.5)
WBC: 6.3 K/uL (ref 4.0–10.5)

## 2024-06-20 LAB — COMPREHENSIVE METABOLIC PANEL WITH GFR
ALT: 19 U/L (ref 0–53)
AST: 23 U/L (ref 0–37)
Albumin: 3.9 g/dL (ref 3.5–5.2)
Alkaline Phosphatase: 59 U/L (ref 39–117)
BUN: 16 mg/dL (ref 6–23)
CO2: 31 meq/L (ref 19–32)
Calcium: 9.7 mg/dL (ref 8.4–10.5)
Chloride: 101 meq/L (ref 96–112)
Creatinine, Ser: 1.06 mg/dL (ref 0.40–1.50)
GFR: 62.39 mL/min (ref >60.00)
Glucose, Bld: 107 mg/dL — ABNORMAL HIGH (ref 70–99)
Potassium: 3.8 meq/L (ref 3.5–5.1)
Sodium: 139 meq/L (ref 135–145)
Total Bilirubin: 0.5 mg/dL (ref 0.2–1.2)
Total Protein: 6.2 g/dL (ref 6.0–8.3)

## 2024-06-20 LAB — IBC PANEL
Iron: 134 ug/dL (ref 42–165)
Saturation Ratios: 46.7 % (ref 20.0–50.0)
TIBC: 287 ug/dL (ref 250.0–450.0)
Transferrin: 205 mg/dL — ABNORMAL LOW (ref 212.0–360.0)

## 2024-06-20 LAB — TSH: TSH: 4.35 u[IU]/mL (ref 0.35–5.50)

## 2024-06-20 LAB — FERRITIN: Ferritin: 146.5 ng/mL (ref 22.0–322.0)

## 2024-06-20 MED ORDER — ASPIRIN 81 MG PO TBEC
81.0000 mg | DELAYED_RELEASE_TABLET | ORAL | Status: AC
Start: 2024-06-20 — End: ?

## 2024-06-20 MED ORDER — GINKGO BILOBA 40 MG PO TABS: 1.0000 | ORAL_TABLET | Freq: Every day | ORAL | Status: AC

## 2024-06-20 NOTE — Assessment & Plan Note (Signed)
 Preventative protocols reviewed and updated unless pt declined. Discussed healthy diet and lifestyle.

## 2024-06-20 NOTE — Patient Instructions (Addendum)
 Labs today  Good to see you today Return as needed or in 1 year for next physical

## 2024-06-20 NOTE — Assessment & Plan Note (Signed)
 Previously discussed. Has at home - wife Rock is HCPOA. Has declined bringing in a copy in the past

## 2024-06-20 NOTE — Progress Notes (Unsigned)
 Ph: [phone] Fax: [phone]   Patient ID: [name], male    DOB: 04-17-1933, 89 y.o.   MRN: [id number]  This visit was conducted in person.  BP 122/60   Pulse 78   Temp 98 F (36.7 C) (Oral)   Ht 5' 5 (1.651 m)   Wt 149 lb 8 oz (67.8 kg)   SpO2 96%   BMI 24.88 kg/m    CC: CPE Subjective:   HPI: [name] is a 89 y.o. male presenting on 06/20/2024 for Annual Exam   Saw health advisor 04/2024 for medicare wellness visit. Note reviewed.  Stressful last few months.   No results found.  Flowsheet Row Office Visit from 06/20/2024 in [facility] [place]  PHQ-2 Total Score 0       06/20/2024   10:03 AM 05/24/2024   11:22 AM 04/09/2024    8:54 AM 03/28/2024   11:35 AM 05/28/2023    1:03 PM  Fall Risk   Falls in the past year? 1 1 1 1  0  Number falls in past yr: 1 1 1 1  0  Injury with Fall? 1 1 1 1  0  Risk for fall due to : Other (Comment) Orthopedic patient;Impaired balance/gait;Impaired mobility Other (Comment) Other (Comment) No Fall Risks  Follow up Falls evaluation completed Education provided;Falls prevention discussed Falls evaluation completed Falls evaluation completed Falls prevention discussed    See prior note for details.  13 lb weight loss over the past 2 yrs, 23 lbs over 3 yrs Eats 2 meals a day Weight loss since stopping alcohol   Wife has Parkinson's disease and CHF, now followed by Dr Tat. Nena Bonine comes out to help 5d/wk.   Ongoing sciatica sees Dr Josefina. Uses walker or cane regularly.   Progressive macrocytic anemia - denies blood in stool or urine, fevers/chills, night sweats, swollen glands, unexpected weight loss (although he is down 10 lbs in the past year). No bone pain.   Preventative: COLONOSCOPY Date: 07/2013 no polyps Deidra) - denies blood in stool  Prostate - elevated PSA remotely with normal biopsy per patient - this was followed by Dr. Nieves in past Q6 mo active surveillance. Has  not returned since 01/2013. Does not want to return to uro. Latest labs PSA had normalized, aged out since.  Lung cancer screening - not eligible Flu shot - yearly  COVID vaccine - Pfizer 08/2019, 09/2019 - booster 09/2020, bivalent 03/2021, again 11/2021 and monovalent 03/2022 Pneumovax 07/2002. Prevnar-13: 01/2014, prevnar-20 - declined Tetanus 2010, Tdap 2012, Td 2022 Zostavax 2008 Shingrix - discussed, declines  Advanced directives: has this at home. Wife [name] would be HCPOA. States [facility] has this - I advised I don't see it in his chart. Has declined bringing  us  a copy.  Seat belt use discussed  Sunscreen use discussed. Wears a hat when outdoors.  Ex-smoker - quit 2000 (50 PY hx) Alcohol - previously drank 16 oz chardonnay daily. Fully quit 11/2020 and remains abstinent since.  Dentist - Q4 mo  Eye exam - yearly  Bowel - no constipation  Bladder - no incontinence    Lives with Rock wife (married since 2005)  Occupation: Medical Sales Representative, retired  Edu: 18+ yrs  Activity: uses stationary bike daily (3-4 mi)  Diet: good water, fruits/vegetables daily. Follows mediterranean diet. Drinks unsweet tea, avoid soft drinks.     Relevant past medical, surgical, family and social history reviewed and updated as indicated. Interim medical history  since our last visit reviewed. Allergies and medications reviewed and updated. Outpatient Medications Prior to Visit  Medication Sig Dispense Refill   B Complex-C (B-COMPLEX WITH VITAMIN C) tablet Take 1 tablet by mouth daily. 30 tablet 0   Coenzyme Q10 (COQ-10) 50 MG CAPS Take by mouth daily.     ferrous sulfate  325 (65 FE) MG EC tablet Take 1 tablet (325 mg total) by mouth daily with breakfast. 60 tablet 0   folic acid  (FOLVITE ) 1 MG tablet Take 1 tablet (1 mg total) by mouth daily. 30 tablet 0   Multiple Vitamins-Minerals (PRESERVISION AREDS 2) CAPS Take 1 capsule by mouth daily.     Zn-Pyg Afri-Nettle-Saw Palmet (SAW PALMETTO COMPLEX PO) Take 250 mg  by mouth every other day.      Ginkgo Biloba 40 MG TABS Take 2 tablets (80 mg total) by mouth daily.     melatonin 3 MG TABS tablet Take 3 mg by mouth at bedtime.     metoprolol  tartrate (LOPRESSOR ) 25 MG tablet Take 12.5 mg by mouth 2 (two) times daily.     polyethylene glycol (MIRALAX  / GLYCOLAX ) 17 g packet Take 17 g by mouth daily. 14 each 0   Potassium 99 MG TABS Take 1 tablet (99 mg total) by mouth every Monday, Wednesday, and Friday.     triamterene -hydrochlorothiazide (MAXZIDE-25) 37.5-25 MG tablet Take 1 tablet by mouth every other day.     Ensure Max Protein (ENSURE MAX PROTEIN) LIQD Take 330 mLs (11 oz total) by mouth daily. 10000 mL 0   Wound Dressings ([employer] FOAM ADHESIVE DRESSING) PADS Apply 2 each topically daily at 6 (six) AM. 60 each 0   No facility-administered medications prior to visit.     Per HPI unless specifically indicated in ROS section below Review of Systems  Constitutional:  Negative for activity change, appetite change, chills, fatigue, fever and unexpected weight change.  HENT:  Negative for hearing loss.   Eyes:  Negative for visual disturbance.  Respiratory:  Negative for cough, chest tightness, shortness of breath and wheezing.   Cardiovascular:  Negative for chest pain, palpitations and leg swelling.  Gastrointestinal:  Negative for abdominal distention, abdominal pain, blood in stool, constipation, diarrhea, nausea and vomiting.  Genitourinary:  Negative for difficulty urinating and hematuria.  Musculoskeletal:  Negative for arthralgias, myalgias and neck pain.  Skin:  Negative for rash.  Neurological:  Negative for dizziness, seizures, syncope and headaches.  Hematological:  Negative for adenopathy. Bruises/bleeds easily.  Psychiatric/Behavioral:  Positive for dysphoric mood. The patient is not nervous/anxious.     Objective:  BP 122/60   Pulse 78   Temp 98 F (36.7 C) (Oral)   Ht 5' 5 (1.651 m)   Wt 149 lb 8 oz (67.8 kg)   SpO2 96%   BMI 24.88  kg/m   Wt Readings from Last 3 Encounters:  06/20/24 149 lb 8 oz (67.8 kg)  05/28/24 144 lb (65.3 kg)  04/09/24 149 lb 4 oz (67.7 kg)      Physical Exam Vitals and nursing note reviewed.  Constitutional:      General: He is not in acute distress.    Appearance: Normal appearance. He is well-developed. He is not ill-appearing.  HENT:     Head: Normocephalic and atraumatic.     Right Ear: Hearing, tympanic membrane, ear canal and external ear normal.     Left Ear: Hearing, tympanic membrane, ear canal and external ear normal.     Mouth/Throat:  Mouth: Mucous membranes are moist.     Pharynx: Oropharynx is clear. No oropharyngeal exudate or posterior oropharyngeal erythema.  Eyes:     General: No scleral icterus.    Extraocular Movements: Extraocular movements intact.     Conjunctiva/sclera: Conjunctivae normal.     Pupils: Pupils are equal, round, and reactive to light.  Neck:     Thyroid : No thyroid  mass or thyromegaly.     Vascular: No carotid bruit.  Cardiovascular:     Rate and Rhythm: Normal rate and regular rhythm.     Pulses: Normal pulses.          Radial pulses are 2+ on the right side and 2+ on the left side.     Heart sounds: Normal heart sounds. No murmur heard. Pulmonary:     Effort: Pulmonary effort is normal. No respiratory distress.     Breath sounds: Normal breath sounds. No wheezing, rhonchi or rales.  Abdominal:     General: Bowel sounds are normal. There is no distension.     Palpations: Abdomen is soft. There is no mass.     Tenderness: There is no abdominal tenderness. There is no guarding or rebound.     Hernia: No hernia is present.  Musculoskeletal:        General: Normal range of motion.     Cervical back: Normal range of motion and neck supple.     Right lower leg: No edema.     Left lower leg: No edema.  Lymphadenopathy:     Cervical: No cervical adenopathy.  Skin:    General: Skin is warm and dry.     Findings: No rash.  Neurological:      General: No focal deficit present.     Mental Status: He is alert and oriented to person, place, and time.  Psychiatric:        Mood and Affect: Mood normal.        Behavior: Behavior normal.        Thought Content: Thought content normal.        Judgment: Judgment normal.       Results for orders placed or performed in visit on 04/09/24  CBC with Differential/Platelet   Collection Time: 04/09/24  9:32 AM  Result Value Ref Range   WBC 4.4 4.0 - 10.5 K/uL   RBC 3.05 (L) 4.22 - 5.81 Mil/uL   Hemoglobin 10.7 (L) 13.0 - 17.0 g/dL   HCT 67.7 (L) 60.9 - 47.9 %   MCV 105.6 (H) 78.0 - 100.0 fl   MCHC 33.4 30.0 - 36.0 g/dL   RDW 84.6 88.4 - 84.4 %   Platelets 199.0 150.0 - 400.0 K/uL   Neutrophils Relative % 51.3 43.0 - 77.0 %   Lymphocytes Relative 32.7 12.0 - 46.0 %   Monocytes Relative 13.7 (H) 3.0 - 12.0 %   Eosinophils Relative 1.8 0.0 - 5.0 %   Basophils Relative 0.5 0.0 - 3.0 %   Neutro Abs 2.2 1.4 - 7.7 K/uL   Lymphs Abs 1.4 0.7 - 4.0 K/uL   Monocytes Absolute 0.6 0.1 - 1.0 K/uL   Eosinophils Absolute 0.1 0.0 - 0.7 K/uL   Basophils Absolute 0.0 0.0 - 0.1 K/uL  Basic metabolic panel with GFR   Collection Time: 04/09/24  9:32 AM  Result Value Ref Range   Sodium 137 135 - 145 mEq/L   Potassium 3.9 3.5 - 5.1 mEq/L   Chloride 100 96 - 112 mEq/L   CO2 28  19 - 32 mEq/L   Glucose, Bld 104 (H) 70 - 99 mg/dL   BUN 14 6 - 23 mg/dL   Creatinine, Ser 8.96 0.40 - 1.50 mg/dL   GFR 35.32 >39.99 mL/min   Calcium 9.8 8.4 - 10.5 mg/dL    Assessment & Plan:   Problem List Items Addressed This Visit     Health maintenance examination - Primary (Chronic)   Preventative protocols reviewed and updated unless pt declined. Discussed healthy diet and lifestyle.       Relevant Orders   Comprehensive metabolic panel with GFR   TSH   CBC with Differential/Platelet   Ferritin   IBC panel   Advanced care planning/counseling discussion (Chronic)   Has this at home - wife Rock is  HCPOA. States [facility] has this - has declined bringing in a copy in the past        Meds ordered this encounter  Medications   aspirin  EC 81 MG tablet    Sig: Take 1 tablet (81 mg total) by mouth every Monday, Wednesday, and Friday. Swallow whole.   Ginkgo Biloba 40 MG TABS    Sig: Take 1 tablet (40 mg total) by mouth daily.    Orders Placed This Encounter  Procedures   Comprehensive metabolic panel with GFR   TSH   CBC with Differential/Platelet   Ferritin   IBC panel    Patient Instructions  Labs today Good to see you today Return as needed or in 1 year for next physical.   Follow up plan: Return in about 1 year (around 06/20/2025) for annual exam, prior fasting for blood work, medicare wellness visit.  Anton Blas, MD

## 2024-06-21 ENCOUNTER — Encounter: Payer: Self-pay | Admitting: Family Medicine

## 2024-06-21 DIAGNOSIS — R319 Hematuria, unspecified: Secondary | ICD-10-CM | POA: Insufficient documentation

## 2024-06-21 NOTE — Assessment & Plan Note (Addendum)
 50+ PY hx, quit remotely

## 2024-06-21 NOTE — Assessment & Plan Note (Signed)
 Quit drinking 11/2020, abstinent since.

## 2024-06-21 NOTE — Assessment & Plan Note (Signed)
 Noted on UA during hospitalization 02/2024.  Denies gross hematuria.  Suggested repeat UA and reviewed workup if persistent hematuria to include CT urogram and cystoscopy r/o mass kidney stone or other cause. He states he would decline any evaluation if persistent hematuria so will not recheck UA today.

## 2024-06-21 NOTE — Assessment & Plan Note (Addendum)
 Chronic issue  Previously attributed to alcohol use however he has been abstinent for 3 yrs now.  Update CBC today. Recent B12, folate WNL. SPEP/IFE WNL 2024 Will offer hematology eval however anticipate he will decline.

## 2024-06-21 NOTE — Assessment & Plan Note (Signed)
 Qie smoking remotely  Asymptomatic off respiratory medications

## 2024-06-21 NOTE — Assessment & Plan Note (Addendum)
 History of HTN, stable period off antihypertensive after weight loss over the past 2 years.

## 2024-06-23 ENCOUNTER — Ambulatory Visit: Payer: Self-pay | Admitting: Family Medicine

## 2024-06-23 MED ORDER — FERROUS SULFATE 325 (65 FE) MG PO TBEC
325.0000 mg | DELAYED_RELEASE_TABLET | ORAL | Status: AC
Start: 2024-06-23 — End: 2025-06-23

## 2024-08-01 ENCOUNTER — Other Ambulatory Visit: Payer: Self-pay | Admitting: Family Medicine

## 2024-08-01 DIAGNOSIS — I10 Essential (primary) hypertension: Secondary | ICD-10-CM

## 2024-08-28 ENCOUNTER — Other Ambulatory Visit: Payer: Self-pay | Admitting: Family Medicine

## 2024-08-28 DIAGNOSIS — I10 Essential (primary) hypertension: Secondary | ICD-10-CM

## 2024-08-28 NOTE — Telephone Encounter (Signed)
 Not on current med list.

## 2024-08-29 NOTE — Telephone Encounter (Signed)
 According to last visit, he was no longer taking blood pressure medicines since weight loss.  Declined refill.

## 2025-06-02 ENCOUNTER — Ambulatory Visit
